# Patient Record
Sex: Female | Born: 1960 | Race: Black or African American | Hispanic: No | Marital: Married | Smoking: Former smoker
Health system: Southern US, Community
[De-identification: ages and names within clinical notes are randomized; demographics above are authoritative.]

## PROBLEM LIST (undated history)

## (undated) DIAGNOSIS — F419 Anxiety disorder, unspecified: Secondary | ICD-10-CM

## (undated) DIAGNOSIS — T883XXA Malignant hyperthermia due to anesthesia, initial encounter: Secondary | ICD-10-CM

## (undated) DIAGNOSIS — Z9889 Other specified postprocedural states: Secondary | ICD-10-CM

## (undated) DIAGNOSIS — G8929 Other chronic pain: Secondary | ICD-10-CM

## (undated) DIAGNOSIS — K5090 Crohn's disease, unspecified, without complications: Secondary | ICD-10-CM

## (undated) DIAGNOSIS — R109 Unspecified abdominal pain: Secondary | ICD-10-CM

## (undated) DIAGNOSIS — I509 Heart failure, unspecified: Secondary | ICD-10-CM

## (undated) DIAGNOSIS — G43909 Migraine, unspecified, not intractable, without status migrainosus: Secondary | ICD-10-CM

## (undated) DIAGNOSIS — Z9109 Other allergy status, other than to drugs and biological substances: Secondary | ICD-10-CM

## (undated) DIAGNOSIS — T8859XA Other complications of anesthesia, initial encounter: Secondary | ICD-10-CM

## (undated) DIAGNOSIS — R569 Unspecified convulsions: Secondary | ICD-10-CM

## (undated) DIAGNOSIS — F4310 Post-traumatic stress disorder, unspecified: Secondary | ICD-10-CM

## (undated) DIAGNOSIS — F429 Obsessive-compulsive disorder, unspecified: Secondary | ICD-10-CM

## (undated) DIAGNOSIS — T4145XA Adverse effect of unspecified anesthetic, initial encounter: Secondary | ICD-10-CM

## (undated) DIAGNOSIS — R51 Headache: Secondary | ICD-10-CM

## (undated) DIAGNOSIS — E119 Type 2 diabetes mellitus without complications: Secondary | ICD-10-CM

## (undated) DIAGNOSIS — Z9289 Personal history of other medical treatment: Secondary | ICD-10-CM

## (undated) DIAGNOSIS — Z8489 Family history of other specified conditions: Secondary | ICD-10-CM

## (undated) DIAGNOSIS — J45909 Unspecified asthma, uncomplicated: Secondary | ICD-10-CM

## (undated) DIAGNOSIS — R519 Headache, unspecified: Secondary | ICD-10-CM

## (undated) DIAGNOSIS — K589 Irritable bowel syndrome without diarrhea: Secondary | ICD-10-CM

## (undated) HISTORY — DX: Obsessive-compulsive disorder, unspecified: F42.9

## (undated) HISTORY — DX: Irritable bowel syndrome without diarrhea: K58.9

## (undated) HISTORY — DX: Heart failure, unspecified: I50.9

## (undated) HISTORY — PX: TUBAL LIGATION: SHX77

## (undated) HISTORY — PX: PARTIAL HYSTERECTOMY: SHX80

## (undated) HISTORY — DX: Post-traumatic stress disorder, unspecified: F43.10

## (undated) HISTORY — PX: BLADDER SURGERY: SHX569

## (undated) HISTORY — PX: ESOPHAGOGASTRODUODENOSCOPY: SHX1529

## (undated) HISTORY — DX: Crohn's disease, unspecified, without complications: K50.90

---

## 1998-03-21 ENCOUNTER — Emergency Department (HOSPITAL_COMMUNITY): Admission: EM | Admit: 1998-03-21 | Discharge: 1998-03-21 | Payer: Self-pay | Admitting: Emergency Medicine

## 1998-06-09 ENCOUNTER — Emergency Department (HOSPITAL_COMMUNITY): Admission: EM | Admit: 1998-06-09 | Discharge: 1998-06-09 | Payer: Self-pay | Admitting: Emergency Medicine

## 1999-01-18 ENCOUNTER — Emergency Department (HOSPITAL_COMMUNITY): Admission: EM | Admit: 1999-01-18 | Discharge: 1999-01-18 | Payer: Self-pay | Admitting: Emergency Medicine

## 1999-01-18 ENCOUNTER — Encounter: Payer: Self-pay | Admitting: Emergency Medicine

## 1999-07-27 ENCOUNTER — Emergency Department (HOSPITAL_COMMUNITY): Admission: EM | Admit: 1999-07-27 | Discharge: 1999-07-27 | Payer: Self-pay | Admitting: Emergency Medicine

## 1999-11-06 ENCOUNTER — Emergency Department (HOSPITAL_COMMUNITY): Admission: EM | Admit: 1999-11-06 | Discharge: 1999-11-06 | Payer: Self-pay | Admitting: Emergency Medicine

## 2000-02-11 ENCOUNTER — Emergency Department (HOSPITAL_COMMUNITY): Admission: EM | Admit: 2000-02-11 | Discharge: 2000-02-12 | Payer: Self-pay | Admitting: Emergency Medicine

## 2000-02-12 ENCOUNTER — Encounter: Payer: Self-pay | Admitting: Emergency Medicine

## 2000-07-26 ENCOUNTER — Emergency Department (HOSPITAL_COMMUNITY): Admission: EM | Admit: 2000-07-26 | Discharge: 2000-07-26 | Payer: Self-pay

## 2000-08-18 ENCOUNTER — Emergency Department (HOSPITAL_COMMUNITY): Admission: EM | Admit: 2000-08-18 | Discharge: 2000-08-18 | Payer: Self-pay | Admitting: Emergency Medicine

## 2001-02-20 ENCOUNTER — Emergency Department (HOSPITAL_COMMUNITY): Admission: EM | Admit: 2001-02-20 | Discharge: 2001-02-20 | Payer: Self-pay | Admitting: Emergency Medicine

## 2001-07-12 ENCOUNTER — Emergency Department (HOSPITAL_COMMUNITY): Admission: EM | Admit: 2001-07-12 | Discharge: 2001-07-12 | Payer: Self-pay | Admitting: Emergency Medicine

## 2002-09-17 ENCOUNTER — Encounter: Payer: Self-pay | Admitting: Emergency Medicine

## 2002-09-17 ENCOUNTER — Emergency Department (HOSPITAL_COMMUNITY): Admission: EM | Admit: 2002-09-17 | Discharge: 2002-09-17 | Payer: Self-pay | Admitting: Emergency Medicine

## 2002-09-18 ENCOUNTER — Encounter: Payer: Self-pay | Admitting: Emergency Medicine

## 2002-09-18 ENCOUNTER — Ambulatory Visit (HOSPITAL_COMMUNITY): Admission: RE | Admit: 2002-09-18 | Discharge: 2002-09-18 | Payer: Self-pay | Admitting: Emergency Medicine

## 2002-11-28 ENCOUNTER — Emergency Department (HOSPITAL_COMMUNITY): Admission: EM | Admit: 2002-11-28 | Discharge: 2002-11-28 | Payer: Self-pay | Admitting: *Deleted

## 2002-11-28 ENCOUNTER — Encounter: Payer: Self-pay | Admitting: *Deleted

## 2003-01-31 ENCOUNTER — Emergency Department (HOSPITAL_COMMUNITY): Admission: EM | Admit: 2003-01-31 | Discharge: 2003-02-01 | Payer: Self-pay | Admitting: *Deleted

## 2003-01-31 ENCOUNTER — Encounter: Payer: Self-pay | Admitting: *Deleted

## 2003-05-05 ENCOUNTER — Emergency Department (HOSPITAL_COMMUNITY): Admission: EM | Admit: 2003-05-05 | Discharge: 2003-05-05 | Payer: Self-pay | Admitting: Emergency Medicine

## 2003-06-03 ENCOUNTER — Emergency Department (HOSPITAL_COMMUNITY): Admission: EM | Admit: 2003-06-03 | Discharge: 2003-06-04 | Payer: Self-pay | Admitting: *Deleted

## 2003-10-25 ENCOUNTER — Emergency Department (HOSPITAL_COMMUNITY): Admission: EM | Admit: 2003-10-25 | Discharge: 2003-10-25 | Payer: Self-pay | Admitting: Emergency Medicine

## 2003-10-28 ENCOUNTER — Emergency Department (HOSPITAL_COMMUNITY): Admission: EM | Admit: 2003-10-28 | Discharge: 2003-10-28 | Payer: Self-pay | Admitting: *Deleted

## 2004-03-19 ENCOUNTER — Emergency Department (HOSPITAL_COMMUNITY): Admission: EM | Admit: 2004-03-19 | Discharge: 2004-03-19 | Payer: Self-pay | Admitting: Emergency Medicine

## 2004-04-04 ENCOUNTER — Emergency Department (HOSPITAL_COMMUNITY): Admission: EM | Admit: 2004-04-04 | Discharge: 2004-04-04 | Payer: Self-pay | Admitting: Emergency Medicine

## 2004-07-27 ENCOUNTER — Emergency Department (HOSPITAL_COMMUNITY): Admission: EM | Admit: 2004-07-27 | Discharge: 2004-07-27 | Payer: Self-pay | Admitting: Emergency Medicine

## 2004-09-04 ENCOUNTER — Other Ambulatory Visit: Admission: RE | Admit: 2004-09-04 | Discharge: 2004-09-04 | Payer: Self-pay | Admitting: Obstetrics & Gynecology

## 2004-12-08 ENCOUNTER — Emergency Department (HOSPITAL_COMMUNITY): Admission: EM | Admit: 2004-12-08 | Discharge: 2004-12-08 | Payer: Self-pay | Admitting: Emergency Medicine

## 2005-06-09 ENCOUNTER — Emergency Department (HOSPITAL_COMMUNITY): Admission: EM | Admit: 2005-06-09 | Discharge: 2005-06-09 | Payer: Self-pay | Admitting: Emergency Medicine

## 2005-11-20 ENCOUNTER — Emergency Department (HOSPITAL_COMMUNITY): Admission: EM | Admit: 2005-11-20 | Discharge: 2005-11-20 | Payer: Self-pay | Admitting: Emergency Medicine

## 2006-04-17 ENCOUNTER — Emergency Department (HOSPITAL_COMMUNITY): Admission: EM | Admit: 2006-04-17 | Discharge: 2006-04-17 | Payer: Self-pay | Admitting: Emergency Medicine

## 2007-04-07 ENCOUNTER — Encounter (INDEPENDENT_AMBULATORY_CARE_PROVIDER_SITE_OTHER): Payer: Self-pay | Admitting: Obstetrics and Gynecology

## 2007-04-08 ENCOUNTER — Inpatient Hospital Stay (HOSPITAL_COMMUNITY): Admission: RE | Admit: 2007-04-08 | Discharge: 2007-04-09 | Payer: Self-pay | Admitting: Obstetrics and Gynecology

## 2007-10-03 ENCOUNTER — Emergency Department (HOSPITAL_COMMUNITY): Admission: EM | Admit: 2007-10-03 | Discharge: 2007-10-04 | Payer: Self-pay | Admitting: Emergency Medicine

## 2009-07-31 ENCOUNTER — Emergency Department (HOSPITAL_COMMUNITY): Admission: EM | Admit: 2009-07-31 | Discharge: 2009-07-31 | Payer: Self-pay | Admitting: Internal Medicine

## 2009-12-05 ENCOUNTER — Emergency Department (HOSPITAL_COMMUNITY): Admission: EM | Admit: 2009-12-05 | Discharge: 2009-12-05 | Payer: Self-pay | Admitting: Emergency Medicine

## 2010-04-25 ENCOUNTER — Emergency Department (HOSPITAL_COMMUNITY): Admission: EM | Admit: 2010-04-25 | Discharge: 2010-04-25 | Payer: Self-pay | Admitting: Emergency Medicine

## 2010-04-27 ENCOUNTER — Emergency Department (HOSPITAL_COMMUNITY): Admission: EM | Admit: 2010-04-27 | Discharge: 2010-04-28 | Payer: Self-pay | Admitting: Emergency Medicine

## 2010-05-14 ENCOUNTER — Encounter: Admission: RE | Admit: 2010-05-14 | Discharge: 2010-05-14 | Payer: Self-pay | Admitting: Family Medicine

## 2010-07-09 ENCOUNTER — Encounter: Admission: RE | Admit: 2010-07-09 | Discharge: 2010-07-09 | Payer: Self-pay | Admitting: Family Medicine

## 2010-09-19 ENCOUNTER — Emergency Department (HOSPITAL_COMMUNITY)
Admission: EM | Admit: 2010-09-19 | Discharge: 2010-09-19 | Payer: Self-pay | Source: Home / Self Care | Admitting: Emergency Medicine

## 2010-12-28 LAB — URINALYSIS, ROUTINE W REFLEX MICROSCOPIC
Bilirubin Urine: NEGATIVE
Nitrite: NEGATIVE
Specific Gravity, Urine: >1.03 — ABNORMAL HIGH (ref 1.005–1.030)
Urobilinogen, UA: 0.2 mg/dL (ref 0.0–1.0)
pH: 5.5 (ref 5.0–8.0)

## 2010-12-28 LAB — DIFFERENTIAL
Basophils Absolute: 0 10*3/uL (ref 0.0–0.1)
Basophils Relative: 0 % (ref 0–1)
Lymphocytes Relative: 5 % — ABNORMAL LOW (ref 12–46)
Neutro Abs: 10.7 10*3/uL — ABNORMAL HIGH (ref 1.7–7.7)
Neutrophils Relative %: 91 % — ABNORMAL HIGH (ref 43–77)

## 2010-12-28 LAB — BASIC METABOLIC PANEL
BUN: 13 mg/dL (ref 6–23)
Calcium: 9.1 mg/dL (ref 8.4–10.5)
GFR calc non Af Amer: >60 mL/min (ref >60)
Glucose, Bld: 169 mg/dL — ABNORMAL HIGH (ref 70–99)
Potassium: 4 mEq/L (ref 3.5–5.1)
Sodium: 138 mEq/L (ref 135–145)

## 2010-12-28 LAB — CBC
HCT: 43.3 % (ref 36.0–46.0)
Hemoglobin: 15.1 g/dL — ABNORMAL HIGH (ref 12.0–15.0)
MCHC: 34.9 g/dL (ref 30.0–36.0)
RDW: 13.2 % (ref 11.5–15.5)
WBC: 11.8 10*3/uL — ABNORMAL HIGH (ref 4.0–10.5)

## 2011-01-03 LAB — COMPREHENSIVE METABOLIC PANEL
AST: 20 U/L (ref 0–37)
Albumin: 4 g/dL (ref 3.5–5.2)
Alkaline Phosphatase: 85 U/L (ref 39–117)
BUN: 14 mg/dL (ref 6–23)
GFR calc Af Amer: >60 mL/min (ref >60)
Potassium: 5 mEq/L (ref 3.5–5.1)
Sodium: 140 mEq/L (ref 135–145)
Total Protein: 7.6 g/dL (ref 6.0–8.3)

## 2011-01-03 LAB — DIFFERENTIAL
Basophils Relative: 0 % (ref 0–1)
Eosinophils Relative: 1 % (ref 0–5)
Monocytes Absolute: 0.4 10*3/uL (ref 0.1–1.0)
Monocytes Relative: 5 % (ref 3–12)
Neutro Abs: 6.1 10*3/uL (ref 1.7–7.7)

## 2011-01-03 LAB — URINALYSIS, ROUTINE W REFLEX MICROSCOPIC
Nitrite: NEGATIVE
Protein, ur: NEGATIVE mg/dL
Specific Gravity, Urine: >1.03 — ABNORMAL HIGH (ref 1.005–1.030)
Urobilinogen, UA: 0.2 mg/dL (ref 0.0–1.0)

## 2011-01-03 LAB — CBC
Platelets: 227 10*3/uL (ref 150–400)
RBC: 4.68 MIL/uL (ref 3.87–5.11)
RDW: 13.1 % (ref 11.5–15.5)
WBC: 7.8 10*3/uL (ref 4.0–10.5)

## 2011-01-12 ENCOUNTER — Emergency Department (HOSPITAL_COMMUNITY)
Admission: EM | Admit: 2011-01-12 | Discharge: 2011-01-13 | Disposition: A | Discharge status: Home / Self Care | Payer: BC Managed Care – PPO | Attending: Emergency Medicine | Admitting: Emergency Medicine

## 2011-01-12 ENCOUNTER — Emergency Department (HOSPITAL_COMMUNITY): Payer: BC Managed Care – PPO

## 2011-01-12 DIAGNOSIS — M25569 Pain in unspecified knee: Secondary | ICD-10-CM | POA: Insufficient documentation

## 2011-01-21 LAB — DIFFERENTIAL
Basophils Absolute: 0 K/uL (ref 0.0–0.1)
Basophils Relative: 0 % (ref 0–1)
Eosinophils Absolute: 0.2 K/uL (ref 0.0–0.7)
Eosinophils Relative: 3 % (ref 0–5)
Lymphocytes Relative: 35 % (ref 12–46)
Lymphs Abs: 2.1 K/uL (ref 0.7–4.0)
Monocytes Absolute: 0.3 K/uL (ref 0.1–1.0)
Monocytes Relative: 6 % (ref 3–12)
Neutro Abs: 3.4 K/uL (ref 1.7–7.7)
Neutrophils Relative %: 56 % (ref 43–77)

## 2011-01-21 LAB — URINALYSIS, ROUTINE W REFLEX MICROSCOPIC
Glucose, UA: NEGATIVE mg/dL
Hgb urine dipstick: NEGATIVE
Protein, ur: NEGATIVE mg/dL
Specific Gravity, Urine: 1.02 (ref 1.005–1.030)

## 2011-01-21 LAB — BASIC METABOLIC PANEL
CO2: 27 mEq/L (ref 19–32)
Chloride: 103 mEq/L (ref 96–112)
GFR calc Af Amer: >60 mL/min (ref >60)
Sodium: 137 mEq/L (ref 135–145)

## 2011-01-21 LAB — CBC
Platelets: 212 10*3/uL (ref 150–400)
RDW: 13.1 % (ref 11.5–15.5)
WBC: 6.1 10*3/uL (ref 4.0–10.5)

## 2011-03-02 NOTE — Op Note (Signed)
Kathryn Olson, Kathryn Olson                ACCOUNT NO.:  [id number]   MEDICAL RECORD NO.:  [id number]          PATIENT TYPE:  OIB   LOCATION:  9399                          FACILITY:  [facility]   PHYSICIAN:  Darlyn Chamber, M.D.   DATE OF BIRTH:  [birth year]   DATE OF PROCEDURE:  04/07/2007  DATE OF DISCHARGE:                               OPERATIVE REPORT   PREOPERATIVE DIAGNOSIS:  Pelvic pain.   POSTOPERATIVE DIAGNOSIS:  Pelvic pain, with evidence of adhesions and  recurrent fibroid.   OPERATIVE PROCEDURE:  Open laparoscopy, lysis of adhesion, laparoscopic-  assisted vaginal hysterectomy.   SURGEON:  McComb.   ASSISTANT:  Grewal.   ANESTHESIA:  Was general.   ESTIMATED BLOOD LOSS:  Was 300-400 mL.   PACKS AND DRAINS:  None.   INTRAOPERATIVE BLOOD REPLACED:  None.   COMPLICATIONS:  None.   INDICATIONS:  Dictated in the history and physical.   PROCEDURE WENT AS FOLLOWS:  The patient was taken to the OR and placed  in supine position.  After satisfactory level of general endotracheal  anesthesia obtained, the patient was placed in a dorsal spine position  using the Allen stirrups.  The abdomen, perineum and vagina were prepped  out with Betadine.  Bladder was emptied with catheterization.  Hulka  tenaculum was put in place and secured.  Patient then draped in a  sterile field.  Subumbilical incision was made knife and extended  through subcutaneous tissue.  Fascia was entered sharply and incision in  the fascia extended laterally.  Rectus muscles were separated in the  midline.  The peritoneum was identified and entered sharply.  An open  laparoscopic trocar was put in place and secured.  Laparoscope was  introduced and abdomen deflated with carbon dioxide.  There were omental  adhesions to the left lower quadrant.  These were taken down using the  gyrus.  No bowel was involved.  The uterus was enlarged with anterior  wall fibroids.  There were adhesions anteriorly to the bladder  area.  Tubes and ovaries were unremarkable.  Cul-de-sac was clear.  Appendix  was visualized and noted be normal.  Upper abdomen including liver and  tip of the gallbladder were clear.  A 5-mm trocar was put in place in  the suprapubic area under direct visualization.  Using the gyrus, the  right utero-ovarian pedicle was cauterized incised; the right round  ligament was cauterized incised down to the broad ligament.  We then  went to the left side.  The left utero-ovarian pedicle was cauterized  incised.  The left round ligament was cauterized incised.  We then  attacked the scar in the anterior, using the gyrus, we were able to  cauterized incised and free the uterus up anteriorly.  There was no  signs of entry into the bladder.  At this point in time, the decision  was to go vaginally.   The laparoscope was removed.  The abdomen was deflated of its carbon  dioxide.  Hulka tenaculum was then removed.  Weighted speculum was  placed in vaginal vault.  Cervix was grasped  with the [facility] tenaculum.  Cul-de-sac was entered sharply.  Both uterosacral ligaments were  clamped, cut and suture ligated with 0 Vicryl.  Reflection of the  vaginal mucosa anteriorly was incised using the Bovie, and bladder was  dissected superiorly.  Paracervical tissue was then clamped, cut and  suture ligated with 0 Vicryl.  Vesicouterine space was identified, and a  retractor was put in place.  Using the clamp, cut and tie technique with  suture ligature of 0 Vicryl, the parametria was serially separated from  the sides of the uterus.  Uterus was then removed.  There was small  bleeding from the right side of the cuff brought under control with 2  figure-of-eights of 0 Vicryl.  At this point in time, vaginal mucosa was  closed in a vertical fashion with interrupted figure-of-eights of 0  Monocryl.  Foley was placed to straight drain.  Clear urine output was  obtained.   The patient's legs were repositioned.   Laparoscope was reintroduced.  The abdomen was deflated of carbon dioxide.  We used the gyrus to  irrigate the pelvis.  Areas of bleeding at the vaginal cuff brought  under control using cautery from the gyrus.  Eventually, we had good  hemostasis.  Abdomen was deflated of carbon dioxide.  All trocars  removed.  Subumbilical fascia closed with figure-of-eight of 0 Vicryl,  skin with interrupted subcuticulars of 4-0 Vicryl.  Suprapubic incision  closed with Dermabond.  The patient was taken out of the dorsal  lithotomy position.  Once alert and extubated, transferred to the  recovery room in good condition.  Sponge, instrument and needle count  reported as correct by circulating nurse x2.  Foley catheter remained  clear at time of closure.  The patient tolerated the procedure well and  was returned to the recovery room in good condition.      Darlyn Chamber, M.D.  Electronically Signed     JSM/MEDQ  D:  04/07/2007  T:  04/07/2007  Job:  [id number]

## 2011-03-02 NOTE — H&P (Signed)
Kathryn Olson, PREISS                ACCOUNT NO.:  [id number]   MEDICAL RECORD NO.:  [id number]          PATIENT TYPE:  AMB   LOCATION:  SDC                           FACILITY:  [facility]   PHYSICIAN:  Darlyn Chamber, M.D.   DATE OF BIRTH:  06-13-1961   DATE OF ADMISSION:  04/07/2007  DATE OF DISCHARGE:                              HISTORY & PHYSICAL   The patient is a 50 year old gravida 2, para 2 female who presents for  laparoscopic-assisted vaginal hysterectomy.   In relation to the present admission, the patient has had a previous  myomectomy done in 2003.  She developed increasing pelvic pain and  discomfort and pain with intercourse.  Evaluation in the office has been  relatively unremarkable.  Uterus felt to be normal size and shape.  We  did do an ultrasound evaluation.  There was a small fibroid remaining  but otherwise ultrasound was unremarkable.  We had discussed proceeding  with laparoscopic evaluation for pelvic adhesions for which we were  initially scheduled for; however, the patient has declined this and  wishes to proceed with more aggressive definitive therapy in the form of  laparoscopic-assisted vaginal hysterectomy for which she is admitted at  the present time.  She does desire ovaries to remain in place.  We had  discussed with her the potential risk of ovarian cancer and difficulty  for detection.  She still wants her ovaries left.   IN TERMS OF ALLERGIES, SHE IS ALLERGIC TO SULFA, ASPIRIN, PENICILLIN,  CODEINE, PEANUTS, RUBBING ALCOHOL.   MEDICATIONS:  Are none.   PAST MEDICAL HISTORY:  Does have a history of migraine headaches.  Otherwise usual childhood diseases.   SURGICAL HISTORY:  She had a previous myomectomy and two cesarean  sections, last one with bilateral tubal ligation.   SOCIAL HISTORY:  Does reveal one pack per week tobacco use.  Occasional  alcohol use.   FAMILY HISTORY:  Mother with history of diabetes and high blood  pressure.  Father with  a history of heart attack.   REVIEW OF SYSTEMS:  Is noncontributory.   PHYSICAL EXAMINATION:  The patient is afebrile with stable vital signs.  HEENT EXAM:  The patient is normocephalic.  Pupils equal, round and  reactive to light accommodation.  Extraocular movements were intact.  Sclerae and conjunctivae clear.  Oropharynx clear.  NECK:  Without thyromegaly.  BREASTS:  No discrete masses.  LUNGS:  Clear.  CARDIOVASCULAR SYSTEM:  Regular rhythm and rate without murmurs or  gallops.  ABDOMINAL EXAM:  Is benign.  No mass, organomegaly or tenderness.  Well-  healed low transverse incision.  PELVIC:  Normal external genitalia.  Vaginal is clear.  Cervix  unremarkable.  Uterus normal size, shape and contour.  Adnexa free of  masses or tenderness.  EXTREMITIES:  Trace edema.  NEUROLOGICAL EXAM:  Grossly normal limits.   IMPRESSION:  Pelvic pain secondary to probable pelvic adhesions.   PLAN:  Options have been discussed for management including a  laparoscopic evaluation versus hysterectomy.  The patient is admitted  for the latter at the present time.  Risks of surgery have been  discussed including the risk of infection.  The risk of hemorrhage that  could require transfusion with the risk of AIDS or hepatitis, risk of  injury to adjacent organs including bladder, bowel or ureters that could  require further exploratory surgery.  The risk of deep venous thrombosis  and pulmonary emboli.  The patient does understand should pelvic  findings indicate we may have to proceed with an abdominal incision to  complete the procedure.  Again, ovaries will be left in place per the  patient's request.      Darlyn Chamber, M.D.  Electronically Signed     JSM/MEDQ  D:  04/07/2007  T:  04/07/2007  Job:  [id number]

## 2011-03-02 NOTE — Discharge Summary (Signed)
NAMEJAVEN, Kathryn Olson                ACCOUNT NO.:  [id number]   MEDICAL RECORD NO.:  [id number]          PATIENT TYPE:  OIB   LOCATION:  9319                          FACILITY:  [facility]   PHYSICIAN:  Darlyn Chamber, M.D.   DATE OF BIRTH:  06/25/1961   DATE OF ADMISSION:  04/07/2007  DATE OF DISCHARGE:  04/08/2007                               DISCHARGE SUMMARY   ADMISSION DIAGNOSIS:  Pelvic pain with recurrent uterine fibroids.   DISCHARGE DIAGNOSIS:  Pelvic pain with recurrent uterine fibroids,  pelvic adhesions.   PROCEDURE:  Laparoscopic-assisted vaginal hysterectomy with lysis of  adhesions.   For complete history and physical, please see dictated note.   COURSE IN THE HOSPITAL:  The patient underwent above-noted surgery.  Did  have dense adhesions between the anterior part of the uterus in the  bladder area.  Also had some omental adhesions, all of which were taken  down.  Ovaries appeared to be normal.  Surgery went well.  Pathology is  pending.  Postoperatively, the patient did well first postoperative day,  afebrile with stable vital signs.  Abdomen soft.  Bowel sounds were  active.  She was tolerating a diet.  Her abdominal exam was benign.  Bowel sounds were active.  Incision was clear.  No active vaginal  bleeding and was voiding.  Postoperative hemoglobin was 13.  In terms of  complication, none were encountered during stay in the hospital.  The  patient was discharged home in stable condition.   DISPOSITION:  Routine postoperative instructions and orders given.  She  is to avoid heavy lifting, vaginal entrance or driving a car.  She is to  watch for signs of infection, nausea/vomiting, increasing abdominal pain  or pelvic pain, or active vaginal bleeding.  We also instructed her to  watch for signs of deep venous thrombosis or pulmonary embolus.  Discharged home on hydrocodone as needed for pain.   PLAN:  Follow-up in the office in 1 week.      Darlyn Chamber,  M.D.  Electronically Signed     JSM/MEDQ  D:  04/08/2007  T:  04/08/2007  Job:  [id number]

## 2011-03-25 ENCOUNTER — Emergency Department (HOSPITAL_COMMUNITY)
Admission: EM | Admit: 2011-03-25 | Discharge: 2011-03-25 | Disposition: A | Discharge status: Home / Self Care | Payer: BC Managed Care – PPO | Attending: Emergency Medicine | Admitting: Emergency Medicine

## 2011-03-25 DIAGNOSIS — Y998 Other external cause status: Secondary | ICD-10-CM | POA: Insufficient documentation

## 2011-03-25 DIAGNOSIS — IMO0002 Reserved for concepts with insufficient information to code with codable children: Secondary | ICD-10-CM | POA: Insufficient documentation

## 2011-03-28 ENCOUNTER — Inpatient Hospital Stay (HOSPITAL_COMMUNITY)
Admission: EM | Admit: 2011-03-28 | Discharge: 2011-03-31 | DRG: 278 | Disposition: A | Discharge status: Home / Self Care | Payer: BC Managed Care – PPO | Source: Ambulatory Visit | Attending: Internal Medicine | Admitting: Internal Medicine

## 2011-03-28 DIAGNOSIS — F410 Panic disorder [episodic paroxysmal anxiety] without agoraphobia: Secondary | ICD-10-CM | POA: Diagnosis present

## 2011-03-28 DIAGNOSIS — K3189 Other diseases of stomach and duodenum: Secondary | ICD-10-CM | POA: Diagnosis present

## 2011-03-28 DIAGNOSIS — L02619 Cutaneous abscess of unspecified foot: Principal | ICD-10-CM | POA: Diagnosis present

## 2011-03-28 DIAGNOSIS — L03119 Cellulitis of unspecified part of limb: Principal | ICD-10-CM | POA: Diagnosis present

## 2011-03-28 DIAGNOSIS — F411 Generalized anxiety disorder: Secondary | ICD-10-CM | POA: Diagnosis present

## 2011-03-29 ENCOUNTER — Inpatient Hospital Stay (HOSPITAL_COMMUNITY): Payer: BC Managed Care – PPO

## 2011-03-29 LAB — CBC
HCT: 41 % (ref 36.0–46.0)
Hemoglobin: 13.8 g/dL (ref 12.0–15.0)
MCH: 30.1 pg (ref 26.0–34.0)
MCHC: 33.7 g/dL (ref 30.0–36.0)
MCV: 89.3 fL (ref 78.0–100.0)
Platelets: 252 10*3/uL (ref 150–400)
RBC: 4.59 MIL/uL (ref 3.87–5.11)
RDW: 13.6 % (ref 11.5–15.5)
WBC: 7.1 10*3/uL (ref 4.0–10.5)

## 2011-03-29 LAB — TSH: TSH: 1.149 u[IU]/mL (ref 0.350–4.500)

## 2011-03-29 LAB — DIFFERENTIAL
Basophils Absolute: 0 K/uL (ref 0.0–0.1)
Basophils Relative: 0 % (ref 0–1)
Eosinophils Absolute: 0.2 10*3/uL (ref 0.0–0.7)
Eosinophils Relative: 3 % (ref 0–5)
Lymphocytes Relative: 31 % (ref 12–46)
Lymphs Abs: 2.2 10*3/uL (ref 0.7–4.0)
Monocytes Absolute: 0.5 K/uL (ref 0.1–1.0)
Monocytes Relative: 7 % (ref 3–12)
Neutro Abs: 4.2 10*3/uL (ref 1.7–7.7)
Neutrophils Relative %: 59 % (ref 43–77)

## 2011-03-29 LAB — BASIC METABOLIC PANEL WITH GFR
BUN: 17 mg/dL (ref 6–23)
Calcium: 9.9 mg/dL (ref 8.4–10.5)
GFR calc Af Amer: >60 mL/min (ref >60)
GFR calc non Af Amer: >60 mL/min (ref >60)
Potassium: 3.8 meq/L (ref 3.5–5.1)

## 2011-03-29 LAB — BASIC METABOLIC PANEL
CO2: 27 mEq/L (ref 19–32)
Chloride: 106 mEq/L (ref 96–112)
Creatinine, Ser: 0.82 mg/dL (ref 0.4–1.2)
Glucose, Bld: 106 mg/dL — ABNORMAL HIGH (ref 70–99)
Sodium: 141 mEq/L (ref 135–145)

## 2011-03-29 LAB — MRSA PCR SCREENING: MRSA by PCR: NEGATIVE

## 2011-03-30 ENCOUNTER — Inpatient Hospital Stay (HOSPITAL_COMMUNITY): Payer: BC Managed Care – PPO

## 2011-03-30 LAB — BASIC METABOLIC PANEL
CO2: 27 mEq/L (ref 19–32)
Chloride: 103 mEq/L (ref 96–112)
Glucose, Bld: 119 mg/dL — ABNORMAL HIGH (ref 70–99)
Sodium: 136 mEq/L (ref 135–145)

## 2011-03-30 LAB — URINALYSIS, ROUTINE W REFLEX MICROSCOPIC
Glucose, UA: NEGATIVE mg/dL
Hgb urine dipstick: NEGATIVE
Specific Gravity, Urine: 1.025 (ref 1.005–1.030)
Urobilinogen, UA: 0.2 mg/dL (ref 0.0–1.0)
pH: 5.5 (ref 5.0–8.0)

## 2011-03-30 LAB — HEPATIC FUNCTION PANEL
ALT: 11 U/L (ref 0–35)
AST: 13 U/L (ref 0–37)
Albumin: 3.1 g/dL — ABNORMAL LOW (ref 3.5–5.2)
Alkaline Phosphatase: 76 U/L (ref 39–117)
Total Protein: 6 g/dL (ref 6.0–8.3)

## 2011-03-30 LAB — CBC
Hemoglobin: 12.6 g/dL (ref 12.0–15.0)
Platelets: 228 10*3/uL (ref 150–400)
RBC: 4.2 MIL/uL (ref 3.87–5.11)
WBC: 6.3 10*3/uL (ref 4.0–10.5)

## 2011-03-30 LAB — PREGNANCY, URINE: Preg Test, Ur: NEGATIVE

## 2011-03-30 NOTE — H&P (Signed)
Kathryn Olson, Kathryn Olson                ACCOUNT NO.:  [id number]  MEDICAL RECORD NO.:  [id number]  LOCATION:  APED                          FACILITY:  [facility]  PHYSICIAN:  Bonnielee Haff, MD     DATE OF BIRTH:  07/11/1961  DATE OF ADMISSION:  03/29/2011 DATE OF DISCHARGE:  LH                             HISTORY & PHYSICAL   PRIMARY CARE PHYSICIAN:  Tanya D. Hassell Done, MD, [facility] in [place].  ADMISSION DIAGNOSES: 1. Right foot cellulitis. 2. Spider bite. 3. Anxiety disorder and history of panic attacks.  CHIEF COMPLAINT:  Pain in the right foot.  HISTORY OF PRESENT ILLNESS:  The patient is a 50 year old African American female who thinks that she was bit by a spider on Thursday night while she was sleeping on the bed.  She presented to the ED at that time and was prescribed doxycycline orally as well as triamcinolone cream.  The patient took these medications, however, her symptoms kept getting worse through the course of the next few days.  Denies any fever, but has been having chills.  The pain is 10/10 in intensity.  The redness and swelling have been spreading from the bite mark towards the rest of the foot as well as of the leg.  She has been unable to ambulate even with the crutches that were provided to her.  The swelling has been increasing.  She has also developed some stomach cramps without any vomiting and complains of a headache as well.  Denies any numbness per se at this time.  She has a history of being constipated all the time.  MEDICATIONS AT HOME:  Xanax 0.5 mg 3 times a day, Zyrtec for allergies, and then she was prescribed doxycycline 100 mg b.i.d. and then triamcinolone cream on Thursday.  ALLERGIES: 1. ASPIRIN which causes shortness of breath. 2. CODEINE causes hives. 3. PENICILLIN causes shortness of breath. 4. She is allergic to SULFA DRUGS which causes hives.  PAST MEDICAL HISTORY:  Positive for anxiety disorder and  migraine headaches.  SURGICAL HISTORY:  C-section x2, removal of a benign thyroid tumor.  She has had a partial hysterectomy.  SOCIAL HISTORY:  She lives in the [place] with her husband and family.  She does not work, does not smoke.  Occasional alcohol use.  No illicit drug use.  FAMILY HISTORY:  Positive for father who died of heart failure.  Mother has diabetes.  Grandmother had heart failure.  REVIEW OF SYSTEMS:  GENERAL:  Positive for weakness and malaise.  HEENT: Positive for headache.  CARDIOVASCULAR:  Unremarkable.  RESPIRATORY: Unremarkable.  GI:  Positive for stomach cramps and some nausea, but no emesis.  GU:  Unremarkable.  NEUROLOGICAL:  Unremarkable.  PSYCHIATRIC: Unremarkable.  DERMATOLOGICAL:  Positive for redness over her right foot.  Other systems are reviewed and found to be negative.  PHYSICAL EXAMINATION:  VITAL SIGNS:  Temperature 98.2, blood pressure 152/80, heart rate 68, respiratory rate 17, and saturation 98% on room air. GENERAL:  This is a overweight African American female in no distress. HEENT:  Head is normocephalic and atraumatic.  Pupils are equal, reacting.  No pallor, no icterus.  Oral mucous membranes  are moist.  No oral lesions are noted. NECK:  Soft and supple.  No thyromegaly appreciated. LUNGS:  Clear to auscultation bilaterally.  No wheezing, rales, or rhonchi.  No cervical, supraclavicular, or inguinal lymphadenopathy is present. CARDIOVASCULAR:  S1 and S2 is normal, regular.  No S3, S4, rubs, murmurs, or bruits.  No pedal edema.  No JVD. ABDOMEN:  Soft, nontender, and nondistended.  It is obese.  No masses or organomegaly is appreciated. GU:  Deferred. MUSCULOSKELETAL:  Normal muscle mass and tone.  Right foot was examined, it is erythematous, slightly warm to touch.  Peripheral pulses are bounding.  No focal neurological deficits are present.  She has got a bite mark in the medial aspect of her foot almost next to the  plantar surface.  That area bruised up.  There is no obvious induration at this time. NEUROLOGICAL:  She is alert and oriented x3.  No focal neurological deficits are present. SKIN:  Positive for erythema over the right foot as described above.  LABORATORY DATA:  Her white count is normal, hemoglobin is 13.8, and platelet count is normal.  Her electrolytes are normal.  Renal function is normal.  She has not had any imaging studies yet.  ASSESSMENT:  This is a 50 year old African American female with a past medical history as stated earlier who presents with worsening pain in her right foot.  She apparently had some kind of a bite on Thursday, possibly spider and this cellulitis from that has worsened over the last few days.  She has failed outpatient oral antibiotic therapy. 1. Right foot cellulitis.  Will be treated with IV vancomycin.  We     will get x-ray of the foot to make sure there is nothing else going     on.  Blood cultures will be done if the patient has fever. 2. Stomach cramping.  Could be related to spider bite, although it is     unusual so many days later.  We will give her a dose of Zofran and     Ativan.  At this time, no further evaluation is necessary as     her abdomen was completely benign to examination. 3. History of anxiety disorder and panic attacks.  Continue with     Xanax. 4. Deep vein thrombosis prophylaxis will be provided.  Further management decisions will depend on results of further testing and patient's response to treatment.  Bonnielee Haff, MD     GK/MEDQ  D:  03/29/2011  T:  03/29/2011  Job:  [id number]  cc:   Tanya D. Hassell Done, M.D. Fax: [phone]  Electronically Signed by Bonnielee Haff MD on 03/30/2011 12:05:06 AM

## 2011-03-31 LAB — VANCOMYCIN, RANDOM: Vancomycin Rm: 13.6 ug/mL

## 2011-03-31 LAB — URINE CULTURE: Culture  Setup Time: 201206121905

## 2011-04-04 NOTE — Discharge Summary (Signed)
Kathryn Olson, Kathryn Olson                ACCOUNT NO.:  [id number]  MEDICAL RECORD NO.:  [id number]  LOCATION:  A209                          FACILITY:  [facility]  PHYSICIAN:  Rexene Alberts, M.D.    DATE OF BIRTH:  1961/07/16  DATE OF ADMISSION:  03/29/2011 DATE OF DISCHARGE:  06/13/2012LH                              DISCHARGE SUMMARY   DISCHARGE DIAGNOSES: 1. Right foot cellulitis, status post insect bite. 2. Generalized anxiety disorder with panic disorder.  Remained stable. 3. Dyspepsia.  DISCHARGE MEDICATIONS: 1. Pepcid 20 mg daily. 2. Doxycycline 100 mg b.i.d. for four more days. 3. Xyzal 10 mg daily. 4. Multivitamin once daily. 5. Xanax 0.5 mg three times daily.  DISCHARGE DISPOSITION:  The patient was discharged to home in improved and stable condition on March 31, 2011.  She was advised to follow up with her primary care physician, Dr. Hassell Done in 1-2 weeks.  CONSULTATIONS:  None.  PROCEDURES PERFORMED: 1. Ultrasound of the abdomen on March 30, 2011.  The results revealed     no acute abnormalities. 2. X-ray of the right foot on March 29, 2011.  The results revealed     medial forefoot soft tissue swelling.  No acute osseous     abnormality.  HISTORY OF PRESENTING ILLNESS:  The patient is a 50 year old woman with a past medical history significant for anxiety disorder, who presented to the emergency department on March 28, 2011, with a chief complaint of right foot pain.  According to the history, she believed that she was bitten by a spider.  She had no associated fever, but she did complain of subjective chills.  The pain in her foot and her leg became progressively worse.  Redness ensued and worsened.  She was actually seen initially on March 25, 2011.  During the evaluation in the emergency department, she was treated supportively and discharged on doxycycline and triamcinolone cream.  When her symptoms did not improve, she came back to the ED on March 28, 2011.  HOSPITAL  COURSE:  During the initial evaluation in the emergency department, the patient was noted to be afebrile and hemodynamically stable.  She was given a 600 mg dose of clindamycin.  Following admission, vancomycin was started empirically for treatment of right foot cellulitis.  She was advised to keep her right leg elevated.  Her pain was treated with as-needed morphine or as-needed oxycodone.  Over the course of the hospitalization, the erythema had completely resolved of her forefoot and proximal leg.  There was a small amount of erythema still present at the nidus of infection.  Edema and tenderness completely resolved.  She remained afebrile during the entire hospitalization.  Her white blood cell count remained within normal limits.  She received a total of four doses of antibiotics during the hospitalization including what she received in the emergency department. She was advised to continue doxycycline at 100 mg b.i.d. for four more days.  The patient complained of a nonspecific abdominal discomfort.  For further evaluation, an ultrasound of her abdomen was ordered along with additional laboratory studies.  The ultrasound of her abdomen was without any acute abnormalities.  Her urine pregnancy test was negative.  Her urinalysis was without infection.  Her pancreatic enzymes were within normal limits.  Her liver transaminases were within normal limits.  She was subsequently started on Pepcid.  She was discharged on Pepcid.  Also, her thyroid function was assessed. Her free T4 was within normal limits at 1.08 and her TSH was within normal limits at 1.14.     Rexene Alberts, M.D.     DF/MEDQ  D:  03/31/2011  T:  04/01/2011  Job:  [id number]  cc:   Tanya D. Hassell Done, M.D. Fax: [phone]  Electronically Signed by Rexene Alberts M.D. on 04/04/2011 02:11:14 PM

## 2011-07-23 LAB — URINE MICROSCOPIC-ADD ON

## 2011-07-23 LAB — DIFFERENTIAL
Basophils Absolute: 0
Lymphocytes Relative: 16
Neutro Abs: 9 — ABNORMAL HIGH

## 2011-07-23 LAB — CBC
HCT: 41.3
MCV: 87.5
Platelets: 258
WBC: 11.5 — ABNORMAL HIGH

## 2011-07-23 LAB — COMPREHENSIVE METABOLIC PANEL
Albumin: 3.6
BUN: 12
CO2: 27
Chloride: 107
Creatinine, Ser: 0.72
GFR calc non Af Amer: >60
Total Bilirubin: 0.4

## 2011-07-23 LAB — URINALYSIS, ROUTINE W REFLEX MICROSCOPIC
Nitrite: NEGATIVE
Specific Gravity, Urine: >1.03 — ABNORMAL HIGH
Urobilinogen, UA: 0.2

## 2011-07-23 LAB — LIPASE, BLOOD: Lipase: 23

## 2011-07-23 LAB — URINE CULTURE

## 2011-08-04 LAB — CBC
HCT: 39.2
HCT: 42.3
Hemoglobin: 13
MCHC: 33.1
MCHC: 33.3
MCV: 89.7
RBC: 4.72
RDW: 13.6
WBC: 5.8

## 2011-08-30 ENCOUNTER — Other Ambulatory Visit: Payer: Self-pay | Admitting: Family Medicine

## 2011-08-30 DIAGNOSIS — N63 Unspecified lump in unspecified breast: Secondary | ICD-10-CM

## 2011-09-14 ENCOUNTER — Ambulatory Visit
Admission: RE | Admit: 2011-09-14 | Discharge: 2011-09-14 | Disposition: A | Discharge status: Home / Self Care | Payer: BC Managed Care – PPO | Source: Ambulatory Visit | Attending: Family Medicine | Admitting: Family Medicine

## 2011-09-14 DIAGNOSIS — N63 Unspecified lump in unspecified breast: Secondary | ICD-10-CM

## 2011-11-17 ENCOUNTER — Emergency Department (HOSPITAL_COMMUNITY)
Admission: EM | Admit: 2011-11-17 | Discharge: 2011-11-18 | Disposition: A | Discharge status: Home / Self Care | Payer: BC Managed Care – PPO | Attending: Emergency Medicine | Admitting: Emergency Medicine

## 2011-11-17 ENCOUNTER — Encounter (HOSPITAL_COMMUNITY): Payer: Self-pay | Admitting: *Deleted

## 2011-11-17 ENCOUNTER — Other Ambulatory Visit: Payer: Self-pay

## 2011-11-17 ENCOUNTER — Emergency Department (HOSPITAL_COMMUNITY): Payer: BC Managed Care – PPO

## 2011-11-17 DIAGNOSIS — R079 Chest pain, unspecified: Secondary | ICD-10-CM | POA: Insufficient documentation

## 2011-11-17 DIAGNOSIS — R1013 Epigastric pain: Secondary | ICD-10-CM | POA: Insufficient documentation

## 2011-11-17 DIAGNOSIS — R10816 Epigastric abdominal tenderness: Secondary | ICD-10-CM | POA: Insufficient documentation

## 2011-11-17 DIAGNOSIS — Z9851 Tubal ligation status: Secondary | ICD-10-CM | POA: Insufficient documentation

## 2011-11-17 DIAGNOSIS — K279 Peptic ulcer, site unspecified, unspecified as acute or chronic, without hemorrhage or perforation: Secondary | ICD-10-CM | POA: Insufficient documentation

## 2011-11-17 DIAGNOSIS — Z9079 Acquired absence of other genital organ(s): Secondary | ICD-10-CM | POA: Insufficient documentation

## 2011-11-17 LAB — BASIC METABOLIC PANEL
Calcium: 9.8 mg/dL (ref 8.4–10.5)
GFR calc Af Amer: >90 mL/min (ref >90)
GFR calc non Af Amer: >90 mL/min (ref >90)
Glucose, Bld: 127 mg/dL — ABNORMAL HIGH (ref 70–99)
Sodium: 139 mEq/L (ref 135–145)

## 2011-11-17 LAB — CBC
MCH: 29.6 pg (ref 26.0–34.0)
MCHC: 33.3 g/dL (ref 30.0–36.0)
Platelets: 233 10*3/uL (ref 150–400)
RDW: 13.5 % (ref 11.5–15.5)

## 2011-11-17 LAB — PRO B NATRIURETIC PEPTIDE: Pro B Natriuretic peptide (BNP): 5 pg/mL (ref 0–125)

## 2011-11-17 LAB — POCT I-STAT TROPONIN I: Troponin i, poc: 0 ng/mL (ref 0.00–0.08)

## 2011-11-17 MED ORDER — PANTOPRAZOLE SODIUM 40 MG IV SOLR
40.0000 mg | Freq: Once | INTRAVENOUS | Status: AC
  Administered 2011-11-17: 40 mg via INTRAVENOUS
  Filled 2011-11-17: qty 40

## 2011-11-17 MED ORDER — HYDROMORPHONE HCL PF 1 MG/ML IJ SOLN
1.0000 mg | Freq: Once | INTRAMUSCULAR | Status: AC
  Administered 2011-11-17: 1 mg via INTRAVENOUS
  Filled 2011-11-17: qty 1

## 2011-11-17 MED ORDER — SODIUM CHLORIDE 0.9 % IV SOLN
Freq: Once | INTRAVENOUS | Status: AC
  Administered 2011-11-17: via INTRAVENOUS

## 2011-11-17 MED ORDER — ONDANSETRON HCL 4 MG/2ML IJ SOLN
4.0000 mg | Freq: Once | INTRAMUSCULAR | Status: AC
  Administered 2011-11-17: 4 mg via INTRAVENOUS
  Filled 2011-11-17: qty 2

## 2011-11-17 NOTE — ED Notes (Signed)
Chest and epigastric pain for 1 week.  Md saw pt and thought was "heart burn"

## 2011-11-17 NOTE — ED Notes (Signed)
No BM in 1 week

## 2011-11-18 LAB — HEPATIC FUNCTION PANEL
Albumin: 3.8 g/dL (ref 3.5–5.2)
Bilirubin, Direct: 0.1 mg/dL (ref 0.0–0.3)
Indirect Bilirubin: 0.2 mg/dL — ABNORMAL LOW (ref 0.3–0.9)
Total Bilirubin: 0.3 mg/dL (ref 0.3–1.2)

## 2011-11-18 MED ORDER — HYDROMORPHONE HCL PF 1 MG/ML IJ SOLN
INTRAMUSCULAR | Status: AC
  Administered 2011-11-18: 1 mg
  Filled 2011-11-18: qty 1

## 2011-11-18 MED ORDER — HYDROCODONE-ACETAMINOPHEN 5-325 MG PO TABS: 1.0000 | ORAL_TABLET | Freq: Four times a day (QID) | ORAL | Status: AC | PRN

## 2011-11-18 MED ORDER — ONDANSETRON HCL 4 MG/2ML IJ SOLN
4.0000 mg | Freq: Once | INTRAMUSCULAR | Status: AC
  Administered 2011-11-18: 4 mg via INTRAVENOUS
  Filled 2011-11-18: qty 2

## 2011-11-18 MED ORDER — IOHEXOL 300 MG/ML  SOLN
100.0000 mL | Freq: Once | INTRAMUSCULAR | Status: AC | PRN
  Administered 2011-11-18: 100 mL via INTRAVENOUS

## 2011-11-18 NOTE — ED Provider Notes (Addendum)
History     CSN: [id number]  Arrival date & time 11/17/11  2250   First MD Initiated Contact with Patient 11/17/11 2303      Chief Complaint  Patient presents with  . Chest Pain    (Consider location/radiation/quality/duration/timing/severity/associated sxs/prior treatment) Patient is a 51 y.o. female presenting with chest pain. The history is provided by the patient (the patient complains of severe epigastric pain radiating into her back.). No language interpreter was used.  Chest Pain The chest pain began 3 - 5 hours ago. Chest pain occurs constantly. The chest pain is unchanged. The pain is associated with eating. At its most intense, the pain is at 7/10. The pain is currently at 6/10. The quality of the pain is described as aching. The pain radiates to the mid back. Chest pain is worsened by eating. Pertinent negatives for primary symptoms include no fever, no fatigue, no shortness of breath, no cough and no abdominal pain. Risk factors include obesity.  Pertinent negatives for past medical history include no aneurysm, no MI and no seizures.     History reviewed. No pertinent past medical history.  Past Surgical History  Procedure Date  . Abdominal hysterectomy   . Tubal ligation   . Cesarean section     History reviewed. No pertinent family history.  History  Substance Use Topics  . Smoking status: Never Smoker   . Smokeless tobacco: Not on file  . Alcohol Use: No    OB History    Grav Para Term Preterm Abortions TAB SAB Ect Mult Living                  Review of Systems  Constitutional: Negative for fever and fatigue.  HENT: Negative for congestion, sinus pressure and ear discharge.   Eyes: Negative for discharge.  Respiratory: Negative for cough and shortness of breath.   Cardiovascular: Negative for chest pain.  Gastrointestinal: Negative for abdominal pain and diarrhea.  Genitourinary: Negative for frequency and hematuria.  Musculoskeletal: Negative for  back pain.  Skin: Negative for rash.  Neurological: Negative for seizures and headaches.  Hematological: Negative.   Psychiatric/Behavioral: Negative for hallucinations.    Allergies  Aspirin; Codeine; Peanut-containing drug products; Penicillins; Rubbing alcohol; and Sulfa antibiotics  Home Medications   Current Outpatient Rx  Name Route Sig Dispense Refill  . ALPRAZOLAM 0.5 MG PO TABS Oral Take 0.5 mg by mouth 3 (three) times daily.    Marland Kitchen ESOMEPRAZOLE MAGNESIUM 40 MG PO CPDR Oral Take 40 mg by mouth 2 (two) times daily.    Marland Kitchen LEVOCETIRIZINE DIHYDROCHLORIDE 5 MG PO TABS Oral Take 5 mg by mouth 1 day or 1 dose.    Marland Kitchen HYDROCODONE-ACETAMINOPHEN 5-325 MG PO TABS Oral Take 1 tablet by mouth every 6 (six) hours as needed for pain. 30 tablet 0    BP 114/73  Pulse 75  Resp 17  SpO2 95%  Physical Exam  Constitutional: She is oriented to person, place, and time. She appears well-developed.  HENT:  Head: Normocephalic and atraumatic.  Eyes: Conjunctivae and EOM are normal. No scleral icterus.  Neck: Neck supple. No thyromegaly present.  Cardiovascular: Normal rate and regular rhythm.  Exam reveals no gallop and no friction rub.   No murmur heard. Pulmonary/Chest: No stridor. She has no wheezes. She has no rales. She exhibits no tenderness.  Abdominal: She exhibits no distension. There is tenderness. There is no rebound.       Tender epigastric  Musculoskeletal: Normal range of  motion. She exhibits no edema.  Lymphadenopathy:    She has no cervical adenopathy.  Neurological: She is oriented to person, place, and time. Coordination normal.  Skin: No rash noted. No erythema.  Psychiatric: She has a normal mood and affect. Her behavior is normal.    ED Course  Procedures (including critical care time)  Labs Reviewed  CBC - Abnormal; Notable for the following:    RBC 5.13 (*)    Hemoglobin 15.2 (*)    All other components within normal limits  BASIC METABOLIC PANEL - Abnormal;  Notable for the following:    Glucose, Bld 127 (*)    All other components within normal limits  HEPATIC FUNCTION PANEL - Abnormal; Notable for the following:    Indirect Bilirubin 0.2 (*)    All other components within normal limits  PRO B NATRIURETIC PEPTIDE  POCT I-STAT TROPONIN I  LIPASE, BLOOD  CBC  DIFFERENTIAL  COMPREHENSIVE METABOLIC PANEL  HEPATIC FUNCTION PANEL   Ct Abdomen Pelvis W Contrast  11/18/2011  *RADIOLOGY REPORT*  Clinical Data: Epigastric pain for 7 days, no bowel movement for 1 week  CT ABDOMEN AND PELVIS WITH CONTRAST  Technique:  Multidetector CT imaging of the abdomen and pelvis was performed following the standard protocol during bolus administration of intravenous contrast. Sagittal and coronal MPR images reconstructed from axial data set.  Contrast: 137m OMNIPAQUE IOHEXOL 300 MG/ML IV SOLN Dilute oral contrast.  Comparison: 04/28/2010  Findings: Lung bases clear. 5 mm right lobe hepatic cyst stable image 28. Remainder of liver, spleen, pancreas, kidneys, and adrenal glands normal appearance. Stomach is distended though contrast is present within the small bowel loops. Small umbilical hernia containing fat. Normal appendix. Uterus surgically absent with normal sized left ovary not visualized right ovary. Questionable mild bowel wall thickening involving the duodenal bulb though similar to prior studies. Large and small bowel loops otherwise normal appearance. No mass, adenopathy, free fluid, or inflammatory process. No acute osseous findings.  IMPRESSION: No acute abnormalities.  Original Report Authenticated By: MBurnetta Sabin M.D.     1. Abdominal pain       MDM  pud        JMaudry Diego MD 11/18/11 0300  JMaudry Diego MD 11/18/11 0300

## 2011-11-18 NOTE — ED Notes (Signed)
Pt complaining of increased nausea since drinking the contrast. edp notified. Verbal order for nausea meds given.

## 2012-01-24 ENCOUNTER — Encounter (HOSPITAL_COMMUNITY): Payer: Self-pay | Admitting: *Deleted

## 2012-01-24 DIAGNOSIS — R070 Pain in throat: Secondary | ICD-10-CM | POA: Insufficient documentation

## 2012-01-24 DIAGNOSIS — J069 Acute upper respiratory infection, unspecified: Secondary | ICD-10-CM | POA: Insufficient documentation

## 2012-01-24 DIAGNOSIS — R509 Fever, unspecified: Secondary | ICD-10-CM | POA: Insufficient documentation

## 2012-01-24 DIAGNOSIS — R630 Anorexia: Secondary | ICD-10-CM | POA: Insufficient documentation

## 2012-01-24 DIAGNOSIS — R490 Dysphonia: Secondary | ICD-10-CM | POA: Insufficient documentation

## 2012-01-24 DIAGNOSIS — R05 Cough: Secondary | ICD-10-CM | POA: Insufficient documentation

## 2012-01-24 DIAGNOSIS — R059 Cough, unspecified: Secondary | ICD-10-CM | POA: Insufficient documentation

## 2012-01-24 DIAGNOSIS — R071 Chest pain on breathing: Secondary | ICD-10-CM | POA: Insufficient documentation

## 2012-01-24 DIAGNOSIS — Z79899 Other long term (current) drug therapy: Secondary | ICD-10-CM | POA: Insufficient documentation

## 2012-01-24 NOTE — ED Notes (Signed)
Fever, hoarse, chest and back pain,  Cough, nonproductive.

## 2012-01-25 ENCOUNTER — Emergency Department (HOSPITAL_COMMUNITY): Payer: BC Managed Care – PPO

## 2012-01-25 ENCOUNTER — Emergency Department (HOSPITAL_COMMUNITY)
Admission: EM | Admit: 2012-01-25 | Discharge: 2012-01-25 | Disposition: A | Discharge status: Home / Self Care | Payer: BC Managed Care – PPO | Attending: Emergency Medicine | Admitting: Emergency Medicine

## 2012-01-25 DIAGNOSIS — J069 Acute upper respiratory infection, unspecified: Secondary | ICD-10-CM

## 2012-01-25 HISTORY — DX: Other allergy status, other than to drugs and biological substances: Z91.09

## 2012-01-25 LAB — URINALYSIS, ROUTINE W REFLEX MICROSCOPIC
Ketones, ur: NEGATIVE mg/dL
Leukocytes, UA: NEGATIVE
Protein, ur: NEGATIVE mg/dL
Urobilinogen, UA: 0.2 mg/dL (ref 0.0–1.0)

## 2012-01-25 MED ORDER — HYDROCOD POLST-CHLORPHEN POLST 10-8 MG/5ML PO LQCR: 5.0000 mL | Freq: Two times a day (BID) | ORAL | Status: DC

## 2012-01-25 NOTE — ED Notes (Signed)
EDP at bedside to examine.

## 2012-01-25 NOTE — Discharge Instructions (Signed)
Get plenty of rest and drink a lot of fluids. Use. The cough medicine as needed for coughing. Do not drive or work when taking the cough medicine. See the Dr. Berneice Gandy your choice for problems.    Saline Nose Drops  To help clear a stuffy nose, put salt water (saline) nose drops in your infant's nose. This helps to loosen the secretions in the nose. Use a bulb syringe to clean the nose out:  Before feeding.   Before putting your infant down for naps.   No more than once every 3 hours to avoid irritating your infant's nostrils.  HOME CARE  Buy nose drops at your local drug store. You can also make nose drops yourself. Mix 1 cup of water with  teaspoon of salt. Stir. Store this mixture at room temperature. Make a new batch daily.   To use the drops:   Put 1 or 2 drops in each side of infant's nose with a clean medicine dropper. Do not use this dropper for any other medicine.   Squeeze the air out of the suction bulb before inserting it into your infant's nose.   While still squeezing the bulb flat, place the tip of the bulb into a nostril. Let air come back into the bulb. The suction will pull snot out of the nose and into the bulb.   Repeat on other nostril.   Squeeze the bulb several times into a tissue and wash the bulb tip in soapy water. Store the bulb with the tip side down on paper towel.   Use the bulb syringe with only the saline drops to avoid irritating your infant's nostrils.  GET HELP RIGHT AWAY IF:  The snot changes to green or yellow.   The snot gets thicker.   Your infant is 3 months or younger with a rectal temperature of 100.4 F (38 C) or higher.   Your infant is older than 3 months with a rectal temperature of 102 F (38.9 C) or higher.   The stuffy nose lasts 10 days or longer.   There is trouble breathing or feeding.  MAKE SURE YOU:  Understand these instructions.   Will watch your infant's condition.   Will get help right away if your infant is not  doing well or gets worse.  Document Released: 08/01/2009 Document Revised: 09/23/2011 Document Reviewed: 08/01/2009 [facility] Patient Information 2012 [place].Upper Respiratory Infection, Adult An upper respiratory infection (URI) is also sometimes known as the common cold. The upper respiratory tract includes the nose, sinuses, throat, trachea, and bronchi. Bronchi are the airways leading to the lungs. Most people improve within 1 week, but symptoms can last up to 2 weeks. A residual cough may last even longer.  CAUSES Many different viruses can infect the tissues lining the upper respiratory tract. The tissues become irritated and inflamed and often become very moist. Mucus production is also common. A cold is contagious. You can easily spread the virus to others by oral contact. This includes kissing, sharing a glass, coughing, or sneezing. Touching your mouth or nose and then touching a surface, which is then touched by another person, can also spread the virus. SYMPTOMS  Symptoms typically develop 1 to 3 days after you come in contact with a cold virus. Symptoms vary from person to person. They may include:  Runny nose.   Sneezing.   Nasal congestion.   Sinus irritation.   Sore throat.   Loss of voice (laryngitis).   Cough.  Fatigue.   Muscle aches.   Loss of appetite.   Headache.   Low-grade fever.  DIAGNOSIS  You might diagnose your own cold based on familiar symptoms, since most people get a cold 2 to 3 times a year. Your caregiver can confirm this based on your exam. Most importantly, your caregiver can check that your symptoms are not due to another disease such as strep throat, sinusitis, pneumonia, asthma, or epiglottitis. Blood tests, throat tests, and X-rays are not necessary to diagnose a common cold, but they may sometimes be helpful in excluding other more serious diseases. Your caregiver will decide if any further tests are required. RISKS AND COMPLICATIONS   You may be at risk for a more severe case of the common cold if you smoke cigarettes, have chronic heart disease (such as heart failure) or lung disease (such as asthma), or if you have a weakened immune system. The very young and very old are also at risk for more serious infections. Bacterial sinusitis, middle ear infections, and bacterial pneumonia can complicate the common cold. The common cold can worsen asthma and chronic obstructive pulmonary disease (COPD). Sometimes, these complications can require emergency medical care and may be life-threatening. PREVENTION  The best way to protect against getting a cold is to practice good hygiene. Avoid oral or hand contact with people with cold symptoms. Wash your hands often if contact occurs. There is no clear evidence that vitamin C, vitamin E, echinacea, or exercise reduces the chance of developing a cold. However, it is always recommended to get plenty of rest and practice good nutrition. TREATMENT  Treatment is directed at relieving symptoms. There is no cure. Antibiotics are not effective, because the infection is caused by a virus, not by bacteria. Treatment may include:  Increased fluid intake. Sports drinks offer valuable electrolytes, sugars, and fluids.   Breathing heated mist or steam (vaporizer or shower).   Eating chicken soup or other clear broths, and maintaining good nutrition.   Getting plenty of rest.   Using gargles or lozenges for comfort.   Controlling fevers with ibuprofen or acetaminophen as directed by your caregiver.   Increasing usage of your inhaler if you have asthma.  Zinc gel and zinc lozenges, taken in the first 24 hours of the common cold, can shorten the duration and lessen the severity of symptoms. Pain medicines may help with fever, muscle aches, and throat pain. A variety of non-prescription medicines are available to treat congestion and runny nose. Your caregiver can make recommendations and may suggest  nasal or lung inhalers for other symptoms.  HOME CARE INSTRUCTIONS   Only take over-the-counter or prescription medicines for pain, discomfort, or fever as directed by your caregiver.   Use a warm mist humidifier or inhale steam from a shower to increase air moisture. This may keep secretions moist and make it easier to breathe.   Drink enough water and fluids to keep your urine clear or pale yellow.   Rest as needed.   Return to work when your temperature has returned to normal or as your caregiver advises. You may need to stay home longer to avoid infecting others. You can also use a face mask and careful hand washing to prevent spread of the virus.  SEEK MEDICAL CARE IF:   After the first few days, you feel you are getting worse rather than better.   You need your caregiver's advice about medicines to control symptoms.   You develop chills, worsening shortness of breath,  or brown or red sputum. These may be signs of pneumonia.   You develop yellow or brown nasal discharge or pain in the face, especially when you bend forward. These may be signs of sinusitis.   You develop a fever, swollen neck glands, pain with swallowing, or white areas in the back of your throat. These may be signs of strep throat.  SEEK IMMEDIATE MEDICAL CARE IF:   You have a fever.   You develop severe or persistent headache, ear pain, sinus pain, or chest pain.   You develop wheezing, a prolonged cough, cough up blood, or have a change in your usual mucus (if you have chronic lung disease).   You develop sore muscles or a stiff neck.  Document Released: 03/30/2001 Document Revised: 09/23/2011 Document Reviewed: 02/05/2011 [facility] Patient Information 2012 [place].

## 2012-01-25 NOTE — ED Provider Notes (Signed)
History     CSN: [id number]  Arrival date & time 01/24/12  2207   First MD Initiated Contact with Patient 01/25/12 0157      Chief Complaint  Patient presents with  . Fever    (Consider location/radiation/quality/duration/timing/severity/associated sxs/prior treatment) HPI Comments: Kathryn Olson is a 51 y.o. Female with cough for 2 weeks follow by onset right flank pain yesterday. She is also having fever, hoarseness, and anorexia. She denies nausea or vomiting. She has no abdominal pain. She feels general achiness. She has been using her medications without relief. She had to miss work today because of the discomfort  Patient is a 51 y.o. female presenting with fever. The history is provided by the patient and a relative.  Fever Primary symptoms of the febrile illness include fever.    Past Medical History  Diagnosis Date  . Environmental allergies     Past Surgical History  Procedure Date  . Abdominal hysterectomy   . Tubal ligation   . Cesarean section     History reviewed. No pertinent family history.  History  Substance Use Topics  . Smoking status: Never Smoker   . Smokeless tobacco: Not on file  . Alcohol Use: No    OB History    Grav Para Term Preterm Abortions TAB SAB Ect Mult Living                  Review of Systems  Constitutional: Positive for fever.  All other systems reviewed and are negative.    Allergies  Aspirin; Codeine; Peanut-containing drug products; Penicillins; Rubbing alcohol; and Sulfa antibiotics  Home Medications   Current Outpatient Rx  Name Route Sig Dispense Refill  . ALPRAZOLAM 0.5 MG PO TABS Oral Take 0.5 mg by mouth 3 (three) times daily.    Marland Kitchen HYDROCOD POLST-CPM POLST ER 10-8 MG/5ML PO LQCR Oral Take 5 mLs by mouth every 12 (twelve) hours. 140 mL 0  . ESOMEPRAZOLE MAGNESIUM 40 MG PO CPDR Oral Take 40 mg by mouth 2 (two) times daily.    Marland Kitchen LEVOCETIRIZINE DIHYDROCHLORIDE 5 MG PO TABS Oral Take 5 mg by mouth 1 day or 1  dose.      BP 130/70  Pulse 68  Temp(Src) 98.4 F (36.9 C) (Oral)  Resp 20  Ht 5' 2"  (1.575 m)  Wt 165 lb (74.844 kg)  BMI 30.18 kg/m2  SpO2 99%  Physical Exam  Nursing note and vitals reviewed. Constitutional: She is oriented to person, place, and time. She appears well-developed and well-nourished. She appears distressed.  HENT:  Head: Normocephalic and atraumatic.       Voice is hoarse.  Eyes: Conjunctivae and EOM are normal. Pupils are equal, round, and reactive to light.  Neck: Normal range of motion and phonation normal. Neck supple.       No stridor  Cardiovascular: Normal rate, regular rhythm and intact distal pulses.   Pulmonary/Chest: Effort normal and breath sounds normal. She has no wheezes. She has no rales. She exhibits no tenderness.  Abdominal: Soft. She exhibits no distension. There is no tenderness. There is no guarding.  Genitourinary:       Mild right costovertebral angle tenderness.  Musculoskeletal: Normal range of motion.       Mild bilateral posterior chest wall pain to light touch  Neurological: She is alert and oriented to person, place, and time. She has normal strength. She exhibits normal muscle tone.  Skin: Skin is warm and dry.  Psychiatric: She has  a normal mood and affect. Her behavior is normal. Judgment and thought content normal.    ED Course  Procedures (including critical care time)  Labs Reviewed  URINALYSIS, ROUTINE W REFLEX MICROSCOPIC - Abnormal; Notable for the following:    APPearance HAZY (*)    Specific Gravity, Urine >1.030 (*)    All other components within normal limits   Dg Chest 2 View  01/25/2012  *RADIOLOGY REPORT*  Clinical Data: Cough, fever, sore throat and chills.  Nonsmoker.  CHEST - 2 VIEW  Comparison: 05/14/2010.  Findings: No infiltrate, congestive heart failure or pneumothorax.  Central pulmonary vascular prominence unchanged.  Heart size top normal.  IMPRESSION: No infiltrate.  Original Report Authenticated By:  Doug Sou, M.D.    Date: 01/25/2012  Rate: 64  Rhythm: normal sinus rhythm  QRS Axis: normal  Intervals: normal  ST/T Wave abnormalities: normal  Conduction Disutrbances:none  Narrative Interpretation:   Old EKG Reviewed: none available and unchanged    1. URI (upper respiratory infection)       MDM  URI symptoms, without evidence of shift bacterial infection, metabolic instability or apparent, acute coronary syndrome.       Plan: Home Medications- Tussionex; Home Treatments- rest, fluids; Recommended follow up- PCP of choice prn  Richarda Blade, MD 01/25/12 [phone]

## 2012-01-25 NOTE — ED Notes (Signed)
C/o cough x 1 week with that starts at posterior ribs and radiates around under right breast.

## 2012-01-25 NOTE — ED Notes (Signed)
Left in c/o spouse for transport home; alert, in no distress; instructions/prescriptions reviewed and f/u info provided.  Verbalizes understanding.

## 2012-03-18 DIAGNOSIS — K5090 Crohn's disease, unspecified, without complications: Secondary | ICD-10-CM

## 2012-03-18 HISTORY — DX: Crohn's disease, unspecified, without complications: K50.90

## 2012-04-25 ENCOUNTER — Encounter (HOSPITAL_COMMUNITY): Payer: Self-pay | Admitting: Emergency Medicine

## 2012-04-25 ENCOUNTER — Emergency Department (HOSPITAL_COMMUNITY): Payer: BC Managed Care – PPO

## 2012-04-25 ENCOUNTER — Emergency Department (HOSPITAL_COMMUNITY)
Admission: EM | Admit: 2012-04-25 | Discharge: 2012-04-25 | Disposition: A | Discharge status: Home / Self Care | Payer: BC Managed Care – PPO | Attending: Emergency Medicine | Admitting: Emergency Medicine

## 2012-04-25 DIAGNOSIS — K529 Noninfective gastroenteritis and colitis, unspecified: Secondary | ICD-10-CM

## 2012-04-25 DIAGNOSIS — R10819 Abdominal tenderness, unspecified site: Secondary | ICD-10-CM | POA: Insufficient documentation

## 2012-04-25 DIAGNOSIS — R109 Unspecified abdominal pain: Secondary | ICD-10-CM | POA: Insufficient documentation

## 2012-04-25 DIAGNOSIS — Z90710 Acquired absence of both cervix and uterus: Secondary | ICD-10-CM | POA: Insufficient documentation

## 2012-04-25 DIAGNOSIS — K5289 Other specified noninfective gastroenteritis and colitis: Secondary | ICD-10-CM | POA: Insufficient documentation

## 2012-04-25 HISTORY — DX: Anxiety disorder, unspecified: F41.9

## 2012-04-25 LAB — URINE MICROSCOPIC-ADD ON

## 2012-04-25 LAB — COMPREHENSIVE METABOLIC PANEL
AST: 21 U/L (ref 0–37)
Albumin: 4.4 g/dL (ref 3.5–5.2)
Alkaline Phosphatase: 91 U/L (ref 39–117)
Chloride: 101 mEq/L (ref 96–112)
Creatinine, Ser: 0.89 mg/dL (ref 0.50–1.10)
Potassium: 4.4 mEq/L (ref 3.5–5.1)
Total Bilirubin: 0.3 mg/dL (ref 0.3–1.2)
Total Protein: 7.9 g/dL (ref 6.0–8.3)

## 2012-04-25 LAB — URINALYSIS, ROUTINE W REFLEX MICROSCOPIC
Glucose, UA: NEGATIVE mg/dL
Ketones, ur: NEGATIVE mg/dL
Leukocytes, UA: NEGATIVE
Nitrite: NEGATIVE
Specific Gravity, Urine: >1.03 — ABNORMAL HIGH (ref 1.005–1.030)
pH: 5.5 (ref 5.0–8.0)

## 2012-04-25 LAB — CBC
MCHC: 33.8 g/dL (ref 30.0–36.0)
Platelets: 267 10*3/uL (ref 150–400)
RDW: 13.5 % (ref 11.5–15.5)
WBC: 9.6 10*3/uL (ref 4.0–10.5)

## 2012-04-25 LAB — LIPASE, BLOOD: Lipase: 21 U/L (ref 11–59)

## 2012-04-25 MED ORDER — HYDROMORPHONE HCL PF 1 MG/ML IJ SOLN
1.0000 mg | Freq: Once | INTRAMUSCULAR | Status: AC
  Administered 2012-04-25: 1 mg via INTRAVENOUS
  Filled 2012-04-25: qty 1

## 2012-04-25 MED ORDER — CIPROFLOXACIN HCL 500 MG PO TABS: 500.0000 mg | ORAL_TABLET | Freq: Two times a day (BID) | ORAL | Status: AC

## 2012-04-25 MED ORDER — ONDANSETRON HCL 4 MG/2ML IJ SOLN
4.0000 mg | Freq: Once | INTRAMUSCULAR | Status: AC
  Administered 2012-04-25: 4 mg via INTRAVENOUS
  Filled 2012-04-25: qty 2

## 2012-04-25 MED ORDER — METRONIDAZOLE 500 MG PO TABS: 500.0000 mg | ORAL_TABLET | Freq: Two times a day (BID) | ORAL | Status: AC

## 2012-04-25 MED ORDER — SODIUM CHLORIDE 0.9 % IV BOLUS (SEPSIS)
1000.0000 mL | Freq: Once | INTRAVENOUS | Status: AC
  Administered 2012-04-25: 1000 mL via INTRAVENOUS

## 2012-04-25 MED ORDER — IOHEXOL 300 MG/ML  SOLN
100.0000 mL | Freq: Once | INTRAMUSCULAR | Status: AC | PRN
  Administered 2012-04-25: 100 mL via INTRAVENOUS

## 2012-04-25 MED ORDER — PROMETHAZINE HCL 25 MG PO TABS: 25.0000 mg | ORAL_TABLET | Freq: Four times a day (QID) | ORAL | Status: DC | PRN

## 2012-04-25 MED ORDER — PROMETHAZINE HCL 25 MG/ML IJ SOLN
25.0000 mg | Freq: Once | INTRAMUSCULAR | Status: AC
  Administered 2012-04-25: 25 mg via INTRAVENOUS
  Filled 2012-04-25: qty 1

## 2012-04-25 MED ORDER — CIPROFLOXACIN IN D5W 400 MG/200ML IV SOLN
400.0000 mg | Freq: Once | INTRAVENOUS | Status: AC
  Administered 2012-04-25: 400 mg via INTRAVENOUS
  Filled 2012-04-25: qty 200

## 2012-04-25 MED ORDER — METRONIDAZOLE 500 MG PO TABS
500.0000 mg | ORAL_TABLET | Freq: Once | ORAL | Status: AC
  Administered 2012-04-25: 500 mg via ORAL
  Filled 2012-04-25: qty 1

## 2012-04-25 MED ORDER — HYDROCODONE-ACETAMINOPHEN 5-500 MG PO TABS: 1.0000 | ORAL_TABLET | Freq: Four times a day (QID) | ORAL | Status: AC | PRN

## 2012-04-25 NOTE — ED Notes (Signed)
Patient states that she is having lower abdominal pain. Also complains of nausea. States her abdomen was swelling up yesterday. Also reports loosing approximately 30 lbs over the past month. States thought she was constipated so she took laxatives, and is now having diarrhea.

## 2012-04-25 NOTE — ED Provider Notes (Signed)
History     CSN: [id number]  Arrival date & time 04/25/12  0052   First MD Initiated Contact with Patient 04/25/12 [phone]      Chief Complaint  Patient presents with  . Abdominal Pain    (Consider location/radiation/quality/duration/timing/severity/associated sxs/prior treatment) Patient is a 51 y.o. female presenting with abdominal pain.  Abdominal Pain The primary symptoms of the illness include abdominal pain. The primary symptoms of the illness do not include fever, shortness of breath or dysuria.  Symptoms associated with the illness do not include chills or back pain.   Hx per PT, lower ABD pain and nausea on and off last few weeks worse over the last 24 hours. No F/C, no vomting or diarrhea. Some loose stools after taking laxatives for discomfort. No blood in stools, no h/o same. No recent ABx or travel. No trauma. Mod in severity, pain is sharp and cramping, constant tonight. Followed by Dr Hassell Done. No h/o same. No vag bleeding or discharge, h/o previous hysto.  Past Medical History  Diagnosis Date  . Environmental allergies   . Anxiety     Past Surgical History  Procedure Date  . Abdominal hysterectomy   . Tubal ligation   . Cesarean section     History reviewed. No pertinent family history.  History  Substance Use Topics  . Smoking status: Never Smoker   . Smokeless tobacco: Not on file  . Alcohol Use: Yes     socially    OB History    Grav Para Term Preterm Abortions TAB SAB Ect Mult Living                  Review of Systems  Constitutional: Negative for fever and chills.  HENT: Negative for neck pain and neck stiffness.   Eyes: Negative for pain.  Respiratory: Negative for shortness of breath.   Cardiovascular: Negative for chest pain.  Gastrointestinal: Positive for abdominal pain.  Genitourinary: Negative for dysuria.  Musculoskeletal: Negative for back pain.  Skin: Negative for rash.  Neurological: Negative for headaches.  All other systems  reviewed and are negative.    Allergies  Aspirin; Codeine; Peanut-containing drug products; Penicillins; Rubbing alcohol; and Sulfa antibiotics  Home Medications   Current Outpatient Rx  Name Route Sig Dispense Refill  . ALPRAZOLAM 0.5 MG PO TABS Oral Take 0.5 mg by mouth 3 (three) times daily.    Marland Kitchen ESOMEPRAZOLE MAGNESIUM 40 MG PO CPDR Oral Take 40 mg by mouth 2 (two) times daily.    Marland Kitchen LEVOCETIRIZINE DIHYDROCHLORIDE 5 MG PO TABS Oral Take 5 mg by mouth 1 day or 1 dose.    Marland Kitchen HYDROCOD POLST-CPM POLST ER 10-8 MG/5ML PO LQCR Oral Take 5 mLs by mouth every 12 (twelve) hours. 140 mL 0    BP 127/68  Pulse 55  Temp 97.9 F (36.6 C) (Oral)  Resp 12  Ht 5' 2"  (1.575 m)  Wt 160 lb (72.576 kg)  BMI 29.26 kg/m2  SpO2 97%  Physical Exam  Constitutional: She is oriented to person, place, and time. She appears well-developed and well-nourished.  HENT:  Head: Normocephalic and atraumatic.  Eyes: Conjunctivae and EOM are normal. Pupils are equal, round, and reactive to light.  Neck: Trachea normal. Neck supple. No thyromegaly present.  Cardiovascular: Normal rate, regular rhythm, S1 normal, S2 normal and normal pulses.     No systolic murmur is present   No diastolic murmur is present  Pulses:      Radial pulses are 2+  on the right side, and 2+ on the left side.  Pulmonary/Chest: Effort normal and breath sounds normal. She has no wheezes. She has no rhonchi. She has no rales. She exhibits no tenderness.  Abdominal: Soft. Normal appearance and bowel sounds are normal. There is no CVA tenderness and negative Murphy's sign.       Localizes pain lower ABD with mild midline and RLQ and LLQ tenderness.   Musculoskeletal:       BLE:s Calves nontender, no cords or erythema, negative Homans sign  Neurological: She is alert and oriented to person, place, and time. She has normal strength. No cranial nerve deficit or sensory deficit. GCS eye subscore is 4. GCS verbal subscore is 5. GCS motor subscore  is 6.  Skin: Skin is warm and dry. No rash noted. She is not diaphoretic.  Psychiatric: Her speech is normal.       Cooperative and appropriate    ED Course  Procedures (including critical care time)  Labs Reviewed  CBC - Abnormal; Notable for the following:    RBC 5.25 (*)     Hemoglobin 15.7 (*)     HCT 46.4 (*)     All other components within normal limits  COMPREHENSIVE METABOLIC PANEL - Abnormal; Notable for the following:    Glucose, Bld 158 (*)     GFR calc non Af Amer 73 (*)     GFR calc Af Amer 85 (*)     All other components within normal limits  URINALYSIS, ROUTINE W REFLEX MICROSCOPIC - Abnormal; Notable for the following:    Specific Gravity, Urine >1.030 (*)     Hgb urine dipstick MODERATE (*)     Bilirubin Urine SMALL (*)     Protein, ur 30 (*)     All other components within normal limits  URINE MICROSCOPIC-ADD ON - Abnormal; Notable for the following:    Bacteria, UA FEW (*)     Casts HYALINE CASTS (*)     All other components within normal limits  LIPASE, BLOOD   Ct Abdomen Pelvis W Contrast  04/25/2012  *RADIOLOGY REPORT*  Clinical Data: Lower abdominal pain and nausea.  30 pounds of weight loss over the past month.  CT ABDOMEN AND PELVIS WITH CONTRAST  Technique:  Multidetector CT imaging of the abdomen and pelvis was performed following the standard protocol during bolus administration of intravenous contrast.  Contrast: 157m OMNIPAQUE IOHEXOL 300 MG/ML  SOLN  Comparison: CT of the abdomen and pelvis performed 11/18/2011  Findings: The visualized lung bases are clear.  A hypodensity noted at the periphery of the right hepatic lobe, nonspecific but likely reflecting a small cyst.  The liver and spleen are otherwise unremarkable in appearance.  The gallbladder is within normal limits.  The pancreas and adrenal glands are unremarkable.  A 0.5 cm cyst is noted at the medial aspect of the left kidney. The kidneys are otherwise grossly unremarkable appearance.  There  is no evidence of hydronephrosis.  No renal or ureteral stones are seen.  No perinephric stranding is appreciated.  No free fluid is identified.  The small bowel is unremarkable in appearance.  The stomach is within normal limits.  No acute vascular abnormalities are seen.  There is diffuse fat infiltration of the wall of the cecum and ascending colon, possibly reflecting chronic sequelae of inflammation; this is grossly stable from the prior study.  The appendix is normal in caliber and contains air, without evidence for appendicitis.  The remainder  of the colon is unremarkable in appearance.  A few diverticula are noted at the cecum.  The bladder is mildly distended; urine in the bladder demonstrates mildly increased attenuation, of uncertain significance.  The patient is status post hysterectomy; no suspicious adnexal masses are seen.  No inguinal lymphadenopathy is seen.  No acute osseous abnormalities are identified.  A partially characterized metallic density at the labia appears to reflect a piercing.  IMPRESSION:  1.  No acute abnormalities seen within the abdomen or pelvis. 2.  Diffuse fat infiltration of the wall of the cecum and ascending colon, relatively stable from the prior study and likely reflecting chronic sequelae of inflammation.  No evidence of acute bowel abnormality.  3.  Likely small hepatic and left renal cysts. 4.  Few diverticula noted at the cecum. 5.  Mildly increased attenuation of urine in the bladder, of uncertain significance.  Original Report Authenticated By: Santa Lighter, M.D.   IV dilaudid. IV zofran. IV phenergan.   On recheck after medications feeling improved. ABD remains soft without peritonitis.  MDM   Lower ABD pain with colitis on CT  IVFs, pain medications and antiemetics. UA reviewed WNL, CT scan ordered and did vomitn after PO contrast. Serial evaluations, improving condition. No h/o colitis. Given CT results, concern for more acute colitis with symptoms.  Plan ABx and close PCP follo wup. GI referral provided.  RX pain medications and phenergan, cipro and flagyl.          Teressa Lower, MD 04/25/12 [phone]

## 2012-04-25 NOTE — ED Notes (Signed)
Patient vomited 1st bottle of contrast, dr. Marnette Burgess notified. Stated for patient to have CT done with IV contrast only

## 2012-04-25 NOTE — ED Notes (Signed)
Pt experienced episode of emesis. Dr Marnette Burgess notified and stated to call CT and do the scan without oral contrast. Pt given 90m Zofran per orders.

## 2012-04-25 NOTE — ED Notes (Signed)
Cipro infusion currently infusing

## 2012-05-10 ENCOUNTER — Ambulatory Visit (INDEPENDENT_AMBULATORY_CARE_PROVIDER_SITE_OTHER): Payer: BC Managed Care – PPO | Admitting: Gastroenterology

## 2012-05-10 ENCOUNTER — Encounter: Payer: Self-pay | Admitting: Gastroenterology

## 2012-05-10 ENCOUNTER — Telehealth: Payer: Self-pay | Admitting: General Practice

## 2012-05-10 VITALS — BP 147/83 | HR 69 | Temp 98.4°F | Ht 62.0 in | Wt 175.0 lb

## 2012-05-10 DIAGNOSIS — K5900 Constipation, unspecified: Secondary | ICD-10-CM | POA: Insufficient documentation

## 2012-05-10 DIAGNOSIS — K219 Gastro-esophageal reflux disease without esophagitis: Secondary | ICD-10-CM

## 2012-05-10 DIAGNOSIS — R1110 Vomiting, unspecified: Secondary | ICD-10-CM | POA: Insufficient documentation

## 2012-05-10 DIAGNOSIS — R933 Abnormal findings on diagnostic imaging of other parts of digestive tract: Secondary | ICD-10-CM | POA: Insufficient documentation

## 2012-05-10 DIAGNOSIS — R1310 Dysphagia, unspecified: Secondary | ICD-10-CM | POA: Insufficient documentation

## 2012-05-10 DIAGNOSIS — R634 Abnormal weight loss: Secondary | ICD-10-CM | POA: Insufficient documentation

## 2012-05-10 DIAGNOSIS — R109 Unspecified abdominal pain: Secondary | ICD-10-CM | POA: Insufficient documentation

## 2012-05-10 DIAGNOSIS — R1314 Dysphagia, pharyngoesophageal phase: Secondary | ICD-10-CM

## 2012-05-10 MED ORDER — LINACLOTIDE 145 MCG PO CAPS: 145.0000 ug | ORAL_CAPSULE | Freq: Every day | ORAL | Status: DC

## 2012-05-10 NOTE — Progress Notes (Signed)
Primary Care Physician:  Redge Gainer, MD  Primary Gastroenterologist: Barney Drain, MD   Chief Complaint  Patient presents with  . Ulcerative Colitis    HPI:  Kathryn Olson is a 51 y.o. female here for further evaluation of couple months of abdominal pain, anorexia, vomiting, weight loss. Symptoms are worse with meals. Left abdominal pain worse with eating. Frequent vomiting and anorexia. If she has no appetite she forces liquids. Denies hematemesis. No fever. BM chronically constipated. No diarrhea. Sometimes no BM 2-3 weeks. Used to take milk of magnesia daily until the vomiting started. No melena, brbpr. Weight down from 217 to 175 in last couple of months. Forcing liquids most of the time. Sometimes heartburn, especially with spices. Solid food dysphagia. Food has to be very soft. Hard to swallow pills. No prior colonoscopy. Remote EGD for ulcers per patient.   Current Outpatient Prescriptions  Medication Sig Dispense Refill  . ALPRAZolam (XANAX) 0.5 MG tablet Take 0.5 mg by mouth 3 (three) times daily.      . chlorpheniramine-HYDROcodone (TUSSIONEX PENNKINETIC ER) 10-8 MG/5ML LQCR Take 5 mLs by mouth every 12 (twelve) hours.  140 mL  0  . esomeprazole (NEXIUM) 40 MG capsule Take 40 mg by mouth as needed.       . flintstones complete (FLINTSTONES) 60 MG chewable tablet Chew 1 tablet by mouth daily.      Marland Kitchen levocetirizine (XYZAL) 5 MG tablet Take 5 mg by mouth 1 day or 1 dose.      . promethazine (PHENERGAN) 25 MG tablet Take 25 mg by mouth every 6 (six) hours as needed.      Marland Kitchen DISCONTD: promethazine (PHENERGAN) 25 MG tablet Take 1 tablet (25 mg total) by mouth every 6 (six) hours as needed for nausea.  30 tablet  0    Allergies as of 05/10/2012 - Review Complete 05/10/2012  Allergen Reaction Noted  . Peanut-containing drug products Anaphylaxis and Other (See Comments) 11/17/2011  . Aspirin Hives 11/17/2011  . Codeine Itching 11/17/2011  . Penicillins Other (See Comments) 11/17/2011    . Sulfa antibiotics Itching 11/17/2011  . Rubbing alcohol (alcohol) Rash 11/17/2011    Past Medical History  Diagnosis Date  . Environmental allergies   . Anxiety     Past Surgical History  Procedure Date  . Partial hysterectomy   . Tubal ligation   . Cesarean section     X2  . Bladder surgery     age 22  . Esophagogastroduodenoscopy     remote, had ulcers, [place], Dr. Truddie Coco    Family History  Problem Relation Age of Onset  . Colon cancer Maternal Grandfather     ?less than age 12s  . Heart attack Father   . Diabetes Mother   . Inflammatory bowel disease Neg Hx   . Liver disease Brother     does know details, but related to MVA?    History   Social History  . Marital Status: Married    Spouse Name: N/A    Number of Children: 2  . Years of Education: N/A   Occupational History  . plant, manual labor, lifting    Social History Main Topics  . Smoking status: Never Smoker   . Smokeless tobacco: Not on file  . Alcohol Use: Yes     socially  . Drug Use: No  . Sexually Active: Yes    Birth Control/ Protection: Surgical   Other Topics Concern  . Not on file   Social  History Narrative  . No narrative on file      ROS:  General: see history of present illness. Negative for fever, chills, fatigue, weakness. Eyes: Negative for vision changes.  ENT: Negative for hoarseness, nasal congestion. CV: Negative for chest pain, angina, palpitations, dyspnea on exertion, peripheral edema.  Respiratory: Negative for dyspnea at rest, dyspnea on exertion, cough, sputum, wheezing.  GI: See history of present illness. GU:  Negative for dysuria, hematuria, urinary incontinence, urinary frequency, nocturnal urination.  MS: Negative for joint pain, low back pain.  Derm: Negative for rash or itching.  Neuro: Negative for weakness, abnormal sensation, seizure, frequent headaches, memory loss, confusion.  Psych: complains of anxiety attacks. Negative for  depression,  suicidal ideation, hallucinations.  Endo: see history of present illness Heme: Negative for bruising or bleeding. Allergy: Negative for rash or hives.    Physical Examination:  BP 147/83  Pulse 69  Temp 98.4 F (36.9 C) (Temporal)  Ht 5' 2"  (1.575 m)  Wt 175 lb (79.379 kg)  BMI 32.01 kg/m2   General: Well-nourished, well-developed in no acute distress.  Head: Normocephalic, atraumatic.   Eyes: Conjunctiva pink, no icterus. Mouth: Oropharyngeal mucosa moist and pink , no lesions erythema or exudate. Neck: Supple without thyromegaly, masses, or lymphadenopathy.  Lungs: Clear to auscultation bilaterally.  Heart: Regular rate and rhythm, no murmurs rubs or gallops.  Abdomen: Bowel sounds are normal, diffuse abdominal tenderness more in the epigastric and central region, nondistended, no hepatosplenomegaly or masses, no abdominal bruits or    hernia , no rebound or guarding.   Rectal: deferred Extremities: No lower extremity edema. No clubbing or deformities.  Neuro: Alert and oriented x 4 , grossly normal neurologically.  Skin: Warm and dry, no rash or jaundice.   Psych: Alert and cooperative, normal mood and affect.  Labs: Lab Results  Component Value Date   WBC 9.6 04/25/2012   HGB 15.7* 04/25/2012   HCT 46.4* 04/25/2012   MCV 88.4 04/25/2012   PLT 267 04/25/2012   Lab Results  Component Value Date   CREATININE 0.89 04/25/2012   BUN 15 04/25/2012   NA 136 04/25/2012   K 4.4 04/25/2012   CL 101 04/25/2012   CO2 24 04/25/2012   Lab Results  Component Value Date   ALT 18 04/25/2012   AST 21 04/25/2012   ALKPHOS 91 04/25/2012   BILITOT 0.3 04/25/2012   Lab Results  Component Value Date   LIPASE 21 04/25/2012     Imaging Studies: Ct Abdomen Pelvis W Contrast  04/25/2012  *RADIOLOGY REPORT*  Clinical Data: Lower abdominal pain and nausea.  30 pounds of weight loss over the past month.  CT ABDOMEN AND PELVIS WITH CONTRAST  Technique:  Multidetector CT imaging of the abdomen and pelvis was  performed following the standard protocol during bolus administration of intravenous contrast.  Contrast: 124m OMNIPAQUE IOHEXOL 300 MG/ML  SOLN  Comparison: CT of the abdomen and pelvis performed 11/18/2011  Findings: The visualized lung bases are clear.  A hypodensity noted at the periphery of the right hepatic lobe, nonspecific but likely reflecting a small cyst.  The liver and spleen are otherwise unremarkable in appearance.  The gallbladder is within normal limits.  The pancreas and adrenal glands are unremarkable.  A 0.5 cm cyst is noted at the medial aspect of the left kidney. The kidneys are otherwise grossly unremarkable appearance.  There is no evidence of hydronephrosis.  No renal or ureteral stones are seen.  No perinephric  stranding is appreciated.  No free fluid is identified.  The small bowel is unremarkable in appearance.  The stomach is within normal limits.  No acute vascular abnormalities are seen.  There is diffuse fat infiltration of the wall of the cecum and ascending colon, possibly reflecting chronic sequelae of inflammation; this is grossly stable from the prior study.  The appendix is normal in caliber and contains air, without evidence for appendicitis.  The remainder of the colon is unremarkable in appearance.  A few diverticula are noted at the cecum.  The bladder is mildly distended; urine in the bladder demonstrates mildly increased attenuation, of uncertain significance.  The patient is status post hysterectomy; no suspicious adnexal masses are seen.  No inguinal lymphadenopathy is seen.  No acute osseous abnormalities are identified.  A partially characterized metallic density at the labia appears to reflect a piercing.  IMPRESSION:  1.  No acute abnormalities seen within the abdomen or pelvis. 2.  Diffuse fat infiltration of the wall of the cecum and ascending colon, relatively stable from the prior study and likely reflecting chronic sequelae of inflammation.  No evidence of acute  bowel abnormality.  3.  Likely small hepatic and left renal cysts. 4.  Few diverticula noted at the cecum. 5.  Mildly increased attenuation of urine in the bladder, of uncertain significance.  Original Report Authenticated By: Santa Lighter, M.D.

## 2012-05-10 NOTE — Telephone Encounter (Signed)
I tried to call the patient to give her a different arrival time for her procedure.  She will need to arrive at 10:00am instead of 9:15am.  All the numbers listed were not working.

## 2012-05-10 NOTE — Progress Notes (Signed)
Faxed to PCP, Dr Laurance Flatten

## 2012-05-10 NOTE — Patient Instructions (Addendum)
Upper endoscopy and colonoscopy in the near future with Dr. Oneida Alar. Please see separate instructions.  Start Linzess 145 mcg orally daily 30 minutes before breakfast. Samples provided. Prescription was sent to your pharmacy. Take rebate card with you to the pharmacy.  Continue Nexium 40 mg daily.  Continue Phenergan as needed.

## 2012-05-10 NOTE — Assessment & Plan Note (Signed)
Numerous GI symptoms over the last few months. She has predominantly left-sided abdominal pain/mid abdominal pain associated with vomiting, weight loss. Denies diarrhea, in fact has significant constipation. She has typical GERD symptoms, solid food dysphagia, frequent vomiting and epigastric pain. Recent CT showed abnormality of the right colon. This would be an unusual presentation for inflammatory bowel disease. She will need to have a colonoscopy to further evaluate CT findings of the colon. Recommend upper endoscopy to further evaluate vomiting, epigastric pain, weight loss. EGD/ED/TCS in near future.  I have discussed the risks, alternatives, benefits with regards to but not limited to the risk of reaction to medication, bleeding, infection, perforation and the patient is agreeable to proceed. Written consent to be obtained.  Add Linzess 12mg daily for constipation. Continue Nexium and phenergan prn.

## 2012-05-15 ENCOUNTER — Encounter (HOSPITAL_COMMUNITY): Payer: Self-pay | Admitting: Pharmacy Technician

## 2012-05-19 ENCOUNTER — Other Ambulatory Visit: Payer: Self-pay | Admitting: Gastroenterology

## 2012-05-19 MED ORDER — PEG-KCL-NACL-NASULF-NA ASC-C 100 G PO SOLR: 1.0000 | Freq: Once | ORAL | Status: DC

## 2012-05-22 ENCOUNTER — Encounter (HOSPITAL_COMMUNITY): Payer: Self-pay | Admitting: *Deleted

## 2012-05-22 ENCOUNTER — Ambulatory Visit (HOSPITAL_COMMUNITY)
Admission: RE | Admit: 2012-05-22 | Discharge: 2012-05-22 | Disposition: A | Discharge status: Home / Self Care | Payer: BC Managed Care – PPO | Source: Ambulatory Visit | Attending: Gastroenterology | Admitting: Gastroenterology

## 2012-05-22 ENCOUNTER — Encounter (HOSPITAL_COMMUNITY)
Admission: RE | Disposition: A | Discharge status: Home / Self Care | Payer: Self-pay | Source: Ambulatory Visit | Attending: Gastroenterology

## 2012-05-22 DIAGNOSIS — D126 Benign neoplasm of colon, unspecified: Secondary | ICD-10-CM | POA: Insufficient documentation

## 2012-05-22 DIAGNOSIS — R1031 Right lower quadrant pain: Secondary | ICD-10-CM | POA: Insufficient documentation

## 2012-05-22 DIAGNOSIS — R109 Unspecified abdominal pain: Secondary | ICD-10-CM

## 2012-05-22 DIAGNOSIS — K5730 Diverticulosis of large intestine without perforation or abscess without bleeding: Secondary | ICD-10-CM

## 2012-05-22 DIAGNOSIS — K222 Esophageal obstruction: Secondary | ICD-10-CM

## 2012-05-22 DIAGNOSIS — K621 Rectal polyp: Secondary | ICD-10-CM

## 2012-05-22 DIAGNOSIS — K620 Anal polyp: Secondary | ICD-10-CM

## 2012-05-22 DIAGNOSIS — D128 Benign neoplasm of rectum: Secondary | ICD-10-CM | POA: Insufficient documentation

## 2012-05-22 DIAGNOSIS — K2970 Gastritis, unspecified, without bleeding: Secondary | ICD-10-CM

## 2012-05-22 DIAGNOSIS — K648 Other hemorrhoids: Secondary | ICD-10-CM | POA: Insufficient documentation

## 2012-05-22 DIAGNOSIS — K2990 Gastroduodenitis, unspecified, without bleeding: Secondary | ICD-10-CM

## 2012-05-22 DIAGNOSIS — R1310 Dysphagia, unspecified: Secondary | ICD-10-CM | POA: Insufficient documentation

## 2012-05-22 DIAGNOSIS — R1110 Vomiting, unspecified: Secondary | ICD-10-CM

## 2012-05-22 DIAGNOSIS — D129 Benign neoplasm of anus and anal canal: Secondary | ICD-10-CM | POA: Insufficient documentation

## 2012-05-22 DIAGNOSIS — R634 Abnormal weight loss: Secondary | ICD-10-CM | POA: Insufficient documentation

## 2012-05-22 DIAGNOSIS — K219 Gastro-esophageal reflux disease without esophagitis: Secondary | ICD-10-CM

## 2012-05-22 DIAGNOSIS — K2940 Chronic atrophic gastritis without bleeding: Secondary | ICD-10-CM | POA: Insufficient documentation

## 2012-05-22 HISTORY — DX: Malignant hyperthermia due to anesthesia, initial encounter: T88.3XXA

## 2012-05-22 HISTORY — DX: Family history of other specified conditions: Z84.89

## 2012-05-22 HISTORY — PX: ESOPHAGOGASTRODUODENOSCOPY: SHX1529

## 2012-05-22 HISTORY — PX: COLONOSCOPY: SHX174

## 2012-05-22 SURGERY — COLONOSCOPY, ESOPHAGOGASTRODUODENOSCOPY (EGD) AND ESOPHAGEAL DILATION (ED)
Anesthesia: Moderate Sedation

## 2012-05-22 MED ORDER — PROMETHAZINE HCL 25 MG/ML IJ SOLN
INTRAMUSCULAR | Status: AC
  Filled 2012-05-22: qty 1

## 2012-05-22 MED ORDER — MIDAZOLAM HCL 5 MG/5ML IJ SOLN
INTRAMUSCULAR | Status: AC
  Filled 2012-05-22: qty 10

## 2012-05-22 MED ORDER — MINERAL OIL PO OIL
TOPICAL_OIL | ORAL | Status: AC
  Filled 2012-05-22: qty 30

## 2012-05-22 MED ORDER — MEPERIDINE HCL 100 MG/ML IJ SOLN
INTRAMUSCULAR | Status: DC | PRN
  Administered 2012-05-22 (×2): 25 mg via INTRAVENOUS
  Administered 2012-05-22: 50 mg via INTRAVENOUS
  Administered 2012-05-22: 25 mg via INTRAVENOUS

## 2012-05-22 MED ORDER — MIDAZOLAM HCL 5 MG/5ML IJ SOLN
INTRAMUSCULAR | Status: DC | PRN
  Administered 2012-05-22 (×2): 2 mg via INTRAVENOUS
  Administered 2012-05-22 (×3): 1 mg via INTRAVENOUS

## 2012-05-22 MED ORDER — BUTAMBEN-TETRACAINE-BENZOCAINE 2-2-14 % EX AERO
INHALATION_SPRAY | CUTANEOUS | Status: DC | PRN
  Administered 2012-05-22: 2 via TOPICAL

## 2012-05-22 MED ORDER — STERILE WATER FOR IRRIGATION IR SOLN
Status: DC | PRN
  Administered 2012-05-22: 11:00:00

## 2012-05-22 MED ORDER — SODIUM CHLORIDE 0.9 % IJ SOLN
INTRAMUSCULAR | Status: AC
  Filled 2012-05-22: qty 10

## 2012-05-22 MED ORDER — ESOMEPRAZOLE MAGNESIUM 40 MG PO CPDR: DELAYED_RELEASE_CAPSULE | ORAL | Status: DC

## 2012-05-22 MED ORDER — SODIUM CHLORIDE 0.45 % IV SOLN
Freq: Once | INTRAVENOUS | Status: AC
  Administered 2012-05-22: 10:00:00 via INTRAVENOUS

## 2012-05-22 MED ORDER — PROMETHAZINE HCL 25 MG/ML IJ SOLN
INTRAMUSCULAR | Status: DC | PRN
  Administered 2012-05-22: 12.5 mg via INTRAVENOUS

## 2012-05-22 MED ORDER — MEPERIDINE HCL 100 MG/ML IJ SOLN
INTRAMUSCULAR | Status: AC
  Filled 2012-05-22: qty 2

## 2012-05-22 NOTE — H&P (Addendum)
Primary Care Physician:  Redge Gainer, MD Primary Gastroenterologist:  Dr. Oneida Alar  Pre-Procedure History & Physical: HPI:  Kathryn Olson is a 51 y.o. female here for DYSPHAGIA/abdominal pain.   Past Medical History  Diagnosis Date  . Environmental allergies   . Anxiety   . Family history of anesthesia complication     MH during C Section  . Malignant hyperthemia     [name] /   Pt has been tested positive    Past Surgical History  Procedure Date  . Partial hysterectomy   . Tubal ligation   . Cesarean section     X2  . Bladder surgery     age 34  . Esophagogastroduodenoscopy     remote, had ulcers, [place], Dr. Truddie Coco    Prior to Admission medications   Medication Sig Start Date End Date Taking? Authorizing Provider  ALPRAZolam Duanne Moron) 0.5 MG tablet Take 0.5 mg by mouth 3 (three) times daily.   Yes Historical Provider, MD  chlorpheniramine-HYDROcodone (TUSSIONEX) 10-8 MG/5ML LQCR Take 5 mLs by mouth every 12 (twelve) hours as needed. For cough 01/25/12  Yes Richarda Blade, MD  esomeprazole (NEXIUM) 40 MG capsule Take 40 mg by mouth daily as needed. For heartburn   Yes Historical Provider, MD  ferrous sulfate 325 (65 FE) MG tablet Take 325 mg by mouth daily with breakfast.   Yes Historical Provider, MD  levocetirizine (XYZAL) 5 MG tablet Take 5 mg by mouth every morning.    Yes Historical Provider, MD  Linaclotide Rolan Lipa) 145 MCG CAPS Take 145 mcg by mouth daily before breakfast. 05/10/12  Yes Mahala Menghini, PA  promethazine (PHENERGAN) 25 MG tablet Take 25 mg by mouth every 6 (six) hours as needed. For nausea   Yes Teressa Lower, MD  EPINEPHrine (EPI-PEN) 0.3 mg/0.3 mL DEVI Inject 0.3 mg into the muscle as needed.     Historical Provider, MD    Allergies as of 05/10/2012 - Review Complete 05/10/2012  Allergen Reaction Noted  . Peanut-containing drug products Anaphylaxis and Other (See Comments) 11/17/2011  . Aspirin Hives 11/17/2011  . Codeine Itching  11/17/2011  . Penicillins Other (See Comments) 11/17/2011  . Sulfa antibiotics Itching 11/17/2011  . Rubbing alcohol (alcohol) Rash 11/17/2011    Family History  Problem Relation Age of Onset  . Colon cancer Maternal Grandfather     ?less than age 38s  . Heart attack Father   . Diabetes Mother   . Inflammatory bowel disease Neg Hx   . Liver disease Brother     does know details, but related to MVA?  Marland Kitchen Malignant hyperthermia Other     History   Social History  . Marital Status: Married    Spouse Name: N/A    Number of Children: 2  . Years of Education: N/A   Occupational History  . plant, manual labor, lifting    Social History Main Topics  . Smoking status: Never Smoker   . Smokeless tobacco: Not on file  . Alcohol Use: Yes     socially  . Drug Use: No  . Sexually Active: Yes    Birth Control/ Protection: Surgical   Other Topics Concern  . Not on file   Social History Narrative  . No narrative on file    Review of Systems: See HPI, otherwise negative ROS   Physical Exam: BP 114/69  Pulse 57  Temp 97.6 F (36.4 C) (Oral)  Resp 18  SpO2 97% General:   Alert,  pleasant and cooperative in NAD Head:  Normocephalic and atraumatic. Neck:  Supple;  Lungs:  Clear throughout to auscultation.    Heart:  Regular rate and rhythm. Abdomen:  Soft, MILD TTP IN EPIGASTRIUM, nondistended. Normal bowel sounds, without guarding, and without rebound.   Neurologic:  Alert and  oriented x4;  grossly normal neurologically.  Impression/Plan:     DYSPHAGIA/abd pain PLAN:  TCS/EGD/DIL TODAY

## 2012-05-22 NOTE — OR Nursing (Signed)
Dr. 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    Dr Mirna Mires stated that moderate sedation with Demerol and Versed is not a trigger for MH.   OK to Precede with procedure.

## 2012-05-22 NOTE — Op Note (Signed)
[facility] [address] [place], [place]  [address]  ENDOSCOPY PROCEDURE REPORT  PATIENT:  Kathryn Olson, Kathryn Olson  MR#:  [id number] BIRTHDATE:  09-24-1961, 51 yrs. old  GENDER:  female  ENDOSCOPIST:  Barney Drain, MD ASSISTANT: Referred by:  Redge Gainer, M.D.  PROCEDURE DATE:  05/22/2012 PROCEDURE:  EGD with biopsy, EGD with dilatation over guidewire ASA CLASS: INDICATIONS:  DYSPHAGIA, WEIGHT LOSS UNINTENTIONAL, ABDOMINAL PAIN   MEDICATIONS:   Demerol 50 mg IV, Versed 2 mg IV TOPICAL ANESTHETIC:  Cetacaine Spray  DESCRIPTION OF PROCEDURE:   After the risks benefits and alternatives of the procedure were thoroughly explained, informed consent was obtained.  The EG-[id number] ([id number]) endoscope was introduced through the mouth and advanced to the second portion of the duodenum.  The instrument was slowly withdrawn as the mucosa was carefully examined.  Prior to withdrawal of the scope, the guidwire was placed.  The esophagus was dilated successfully.  The patient was recovered in endoscopy and discharged home in satisfactory condition. <<PROCEDUREIMAGES>>  A stricture was found in the distal esophagus NARROWING THE LUMEN TO 12-13 MM.  Moderate gastritis was found & BIOPSIED VIA COLD FORCEPS.    NL DUODENUM. Dilation was then performed at the total esophagus  1) Dilator:  Savary over guidewire  Size(s):  12.8-16 MM Resistance:  MINIMAL TO MODERATE  Heme: Appearance:  COMPLICATIONS:  None  ENDOSCOPIC IMPRESSION: 1) Stricture in the distal esophagus 2) Moderate gastritis  RECOMMENDATIONS: AWAIT BIOPSY NEXIUM 30 MINUTES PRIOR TO FIRST MEAL LOW FAT/HIGH FIBER DIET DRINK WATER LOSE WEIGHT-BMI CURRENTLY > 30 OPV IN 3 MOS  REPEAT EXAM:  No  ______________________________ Barney Drain, MD  CC:  n. eSIGNED:   Muhamad Serano at 05/22/2012 12:20 PM  Rodman Pickle, [phone]

## 2012-05-22 NOTE — Op Note (Signed)
[facility] [address] [place], [place]  [address]  COLONOSCOPY PROCEDURE REPORT  PATIENT:  Kathryn Olson, Kathryn Olson  MR#:  [id number] BIRTHDATE:  09/12/1961, 51 yrs. old  GENDER:  female  ENDOSCOPIST:  Barney Drain, MD REF. BY:  Redge Gainer, M.D. ASSISTANT:  PROCEDURE DATE:  05/22/2012 PROCEDURE:  Colonoscopy with biopsy  INDICATIONS:  abd pain, constipation- pmhx: anxiety/psychosocial stressors  LINZESS HELPED CONSTIPATION  MEDICATIONS:   Demerol 75 mg IV, promethazine (Phenergan) 12.5 mg IV, Versed 5 mg IV  DESCRIPTION OF PROCEDURE:    Physical exam was performed. Informed consent was obtained from the patient after explaining the benefits, risks, and alternatives to procedure.  The patient was connected to monitor and placed in left lateral position. Continuous oxygen was provided by nasal cannula and IV medicine administered through an indwelling cannula.  After administration of sedation and rectal exam, the patient's rectum was intubated and the EC-[id number] ([id number]) colonoscope was advanced under direct visualization to the cecum.  The scope was removed slowly by carefully examining the color, texture, anatomy, and integrity mucosa on the way out.  The patient was recovered in endoscopy and discharged home in satisfactory condition. <<PROCEDUREIMAGES>>  FINDINGS:  There were TWO  2 MM SESSLE HYPERPLASTC APPEARING polyp identified and removed. in the sigmoid colon.  There wAS ONE 2 MM HYPERPLASTIC APPEARING Polyp identified and removed. in the rectum.  POLYPS REMOVED VIA COLD FORCEPS. Scattered diverticula were found throughout the colon.  SMALL Internal Hemorrhoids were found.  PREP QUALITY:    GOOD CECAL W/D TIME:      22 minutes  COMPLICATIONS:    None  ENDOSCOPIC IMPRESSION: 1) Polyps, multiple in the sigmoid colon 2) Polyp in the rectum 3) MODERATE DiverticulOSIS throughout the colon 4) Internal hemorrhoids  RECOMMENDATIONS: AWAIT BIOPSY DRINK  WATER HIGH FIBER DIET CONTINUE LINZESS OPV IN 3 MOS TCS IN 10 YEARS  REPEAT EXAM:  No  ______________________________ Barney Drain, MD  CC:  Redge Gainer, M.D.  n. eSIGNED:   Ernesto Lashway at 05/22/2012 12:27 PM  Rodman Pickle, [phone]

## 2012-05-24 NOTE — Progress Notes (Signed)
REVIEWED.  

## 2012-05-25 ENCOUNTER — Telehealth: Payer: Self-pay | Admitting: *Deleted

## 2012-05-25 NOTE — Telephone Encounter (Signed)
Kathryn Olson would like to speak with someone regarding her colonoscopy. Please call her back.

## 2012-05-25 NOTE — Telephone Encounter (Signed)
LMOM to call.

## 2012-05-26 NOTE — Telephone Encounter (Signed)
Pt just wants results of path when it is available.

## 2012-05-27 ENCOUNTER — Emergency Department (HOSPITAL_COMMUNITY)
Admission: EM | Admit: 2012-05-27 | Discharge: 2012-05-28 | Disposition: A | Discharge status: Home / Self Care | Payer: BC Managed Care – PPO | Attending: Emergency Medicine | Admitting: Emergency Medicine

## 2012-05-27 ENCOUNTER — Emergency Department (HOSPITAL_COMMUNITY): Payer: BC Managed Care – PPO

## 2012-05-27 ENCOUNTER — Encounter (HOSPITAL_COMMUNITY): Payer: Self-pay

## 2012-05-27 DIAGNOSIS — K5730 Diverticulosis of large intestine without perforation or abscess without bleeding: Secondary | ICD-10-CM | POA: Insufficient documentation

## 2012-05-27 DIAGNOSIS — R109 Unspecified abdominal pain: Secondary | ICD-10-CM | POA: Insufficient documentation

## 2012-05-27 DIAGNOSIS — R112 Nausea with vomiting, unspecified: Secondary | ICD-10-CM

## 2012-05-27 DIAGNOSIS — Z9889 Other specified postprocedural states: Secondary | ICD-10-CM | POA: Insufficient documentation

## 2012-05-27 HISTORY — DX: Other specified postprocedural states: Z98.890

## 2012-05-27 HISTORY — DX: Adverse effect of unspecified anesthetic, initial encounter: T41.45XA

## 2012-05-27 HISTORY — DX: Other complications of anesthesia, initial encounter: T88.59XA

## 2012-05-27 LAB — COMPREHENSIVE METABOLIC PANEL
ALT: 16 U/L (ref 0–35)
Albumin: 4.1 g/dL (ref 3.5–5.2)
Alkaline Phosphatase: 82 U/L (ref 39–117)
BUN: 9 mg/dL (ref 6–23)
Chloride: 104 mEq/L (ref 96–112)
Glucose, Bld: 143 mg/dL — ABNORMAL HIGH (ref 70–99)
Potassium: 4.1 mEq/L (ref 3.5–5.1)
Total Bilirubin: 0.3 mg/dL (ref 0.3–1.2)

## 2012-05-27 LAB — CBC WITH DIFFERENTIAL/PLATELET
Basophils Relative: 0 % (ref 0–1)
Hemoglobin: 13.7 g/dL (ref 12.0–15.0)
Lymphs Abs: 1.1 10*3/uL (ref 0.7–4.0)
Monocytes Relative: 5 % (ref 3–12)
Neutro Abs: 6 10*3/uL (ref 1.7–7.7)
Neutrophils Relative %: 79 % — ABNORMAL HIGH (ref 43–77)
RBC: 4.77 MIL/uL (ref 3.87–5.11)

## 2012-05-27 LAB — LIPASE, BLOOD: Lipase: 16 U/L (ref 11–59)

## 2012-05-27 MED ORDER — HYDROMORPHONE HCL PF 1 MG/ML IJ SOLN
1.0000 mg | Freq: Once | INTRAMUSCULAR | Status: AC
  Administered 2012-05-27: 1 mg via INTRAVENOUS
  Filled 2012-05-27: qty 1

## 2012-05-27 MED ORDER — IOHEXOL 300 MG/ML  SOLN: 10.0000 mL | Freq: Once | INTRAMUSCULAR | Status: AC | PRN

## 2012-05-27 MED ORDER — ONDANSETRON HCL 4 MG/2ML IJ SOLN
4.0000 mg | Freq: Once | INTRAMUSCULAR | Status: AC
  Administered 2012-05-27: 4 mg via INTRAVENOUS
  Filled 2012-05-27: qty 2

## 2012-05-27 MED ORDER — IOHEXOL 300 MG/ML  SOLN
100.0000 mL | Freq: Once | INTRAMUSCULAR | Status: AC | PRN
  Administered 2012-05-27: 100 mL via INTRAVENOUS

## 2012-05-27 MED ORDER — SODIUM CHLORIDE 0.9 % IV SOLN: Freq: Once | INTRAVENOUS | Status: DC

## 2012-05-27 MED ORDER — SODIUM CHLORIDE 0.9 % IV BOLUS (SEPSIS)
1000.0000 mL | Freq: Once | INTRAVENOUS | Status: AC
  Administered 2012-05-27: 1000 mL via INTRAVENOUS

## 2012-05-27 NOTE — ED Notes (Signed)
Pt presents with generalized abdominal pain that has worsened over past week. Today has been persistent with pain and nausea. Pt reports having an EGD and colonoscopy on Monday with polyp removal. Pt reports not feeling good since procedure. Pt is tearful and anxious at this time. C/o N/v with last emesis after dinner tonight consisting of broccoli and chicken. Pt's abdomin is soft, rounded with positive bowel sounds.

## 2012-05-27 NOTE — ED Notes (Signed)
Abdominal pain and vomiting per pt. Had a EGD and Colonoscopy on Monday per pt. Haven't felt well since per pt.

## 2012-05-27 NOTE — ED Provider Notes (Signed)
History     CSN: [id number]  Arrival date & time 05/27/12  2124   First MD Initiated Contact with Patient 05/27/12 2138      Chief Complaint  Patient presents with  . Abdominal Pain  . Emesis    (Consider location/radiation/quality/duration/timing/severity/associated sxs/prior treatment) Patient is a 51 y.o. female presenting with abdominal pain and vomiting. The history is provided by the patient.  Abdominal Pain The primary symptoms of the illness include abdominal pain and vomiting.  Emesis  Associated symptoms include abdominal pain.  She had a colonoscopy and upper endoscopy 5 days ago. Last night, she started noticing abdominal distention. This morning at 1 AM, she started having abdominal pain and vomiting. She has been vomiting throughout the day. Pain is severe and located in the periumbilical area radiating around to the left side of the abdomen into the back. Pain is not improved after vomiting. She's also had a few watery bowel movements. There is no relief of pain with bowel movement. She's had chills but no fever or sweats. Pain is severe and she rates it 10/10. She has not taken anything for pain or nausea.  Past Medical History  Diagnosis Date  . Environmental allergies   . Anxiety   . Family history of anesthesia complication     MH during C Section  . 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 Complication of anesthesia   . H/O colonoscopy     Past Surgical History  Procedure Date  . Partial hysterectomy   . Tubal ligation   . Cesarean section     X2  . Bladder surgery     age 33  . Esophagogastroduodenoscopy     remote, had ulcers, [place], Dr. Truddie Coco    Family History  Problem Relation Age of Onset  . Colon cancer Maternal Grandfather     ?less than age 68s  . Heart attack Father   . Diabetes Mother   . Inflammatory bowel disease Neg Hx   . Liver disease Brother     does know details, but related to MVA?    Marland Kitchen Malignant hyperthermia Other     History  Substance Use Topics  . Smoking status: Never Smoker   . Smokeless tobacco: Not on file  . Alcohol Use: No     socially    OB History    Grav Para Term Preterm Abortions TAB SAB Ect Mult Living                  Review of Systems  Gastrointestinal: Positive for vomiting and abdominal pain.  All other systems reviewed and are negative.    Allergies  Aspirin; Peanut-containing drug products; Penicillins; Sulfa antibiotics; Codeine; and Rubbing alcohol  Home Medications   Current Outpatient Rx  Name Route Sig Dispense Refill  . ALPRAZOLAM 0.5 MG PO TABS Oral Take 0.5 mg by mouth 3 (three) times daily.    Marland Kitchen HYDROCOD POLST-CPM POLST ER 10-8 MG/5ML PO LQCR Oral Take 5 mLs by mouth every 12 (twelve) hours as needed. For cough    . ESOMEPRAZOLE MAGNESIUM 40 MG PO CPDR  1 po 30 minutes prior to your first meal FOREVER 31 capsule 11  . LEVOCETIRIZINE DIHYDROCHLORIDE 5 MG PO TABS Oral Take 5 mg by mouth every morning.     Marland Kitchen LINACLOTIDE 145 MCG PO CAPS Oral Take 145 mcg by mouth daily before breakfast.    . PROMETHAZINE HCL  25 MG PO TABS Oral Take 25 mg by mouth every 6 (six) hours as needed. For nausea    . EPINEPHRINE 0.3 MG/0.3ML IJ DEVI Intramuscular Inject 0.3 mg into the muscle as needed.       BP 124/79  Pulse 68  Temp 98.5 F (36.9 C) (Oral)  Resp 20  Ht 5' 2"  (1.575 m)  SpO2 99%  Physical Exam  Nursing note and vitals reviewed. 51year old female, who appears to be in pain. Vital signs are normal. Oxygen saturation is 99%, which is normal. Head is normocephalic and atraumatic. PERRLA, EOMI. Oropharynx is clear. Neck is nontender and supple without adenopathy or JVD. Back is nontender and there is no CVA tenderness. Lungs are clear without rales, wheezes, or rhonchi. Chest is nontender. Heart has regular rate and rhythm without murmur. Abdomen is slightly distended with tenderness in the periumbilical area and left upper  and left lower quadrants, no rebound or guarding, no masses or hepatosplenomegaly and peristalsis is hypoactive. Extremities have no cyanosis or edema, full range of motion is present. Skin is warm and dry without rash. Neurologic: Mental status is normal, cranial nerves are intact, there are no motor or sensory deficits.   ED Course  Procedures (including critical care time)  Results for orders placed during the hospital encounter of 05/27/12  CBC WITH DIFFERENTIAL      Component Value Range   WBC 7.6  4.0 - 10.5 K/uL   RBC 4.77  3.87 - 5.11 MIL/uL   Hemoglobin 13.7  12.0 - 15.0 g/dL   HCT 41.8  36.0 - 46.0 %   MCV 87.6  78.0 - 100.0 fL   MCH 28.7  26.0 - 34.0 pg   MCHC 32.8  30.0 - 36.0 g/dL   RDW 13.2  11.5 - 15.5 %   Platelets 220  150 - 400 K/uL   Neutrophils Relative 79 (*) 43 - 77 %   Neutro Abs 6.0  1.7 - 7.7 K/uL   Lymphocytes Relative 15  12 - 46 %   Lymphs Abs 1.1  0.7 - 4.0 K/uL   Monocytes Relative 5  3 - 12 %   Monocytes Absolute 0.4  0.1 - 1.0 K/uL   Eosinophils Relative 1  0 - 5 %   Eosinophils Absolute 0.1  0.0 - 0.7 K/uL   Basophils Relative 0  0 - 1 %   Basophils Absolute 0.0  0.0 - 0.1 K/uL  COMPREHENSIVE METABOLIC PANEL      Component Value Range   Sodium 142  135 - 145 mEq/L   Potassium 4.1  3.5 - 5.1 mEq/L   Chloride 104  96 - 112 mEq/L   CO2 29  19 - 32 mEq/L   Glucose, Bld 143 (*) 70 - 99 mg/dL   BUN 9  6 - 23 mg/dL   Creatinine, Ser 0.86  0.50 - 1.10 mg/dL   Calcium 10.3  8.4 - 10.5 mg/dL   Total Protein 7.5  6.0 - 8.3 g/dL   Albumin 4.1  3.5 - 5.2 g/dL   AST 15  0 - 37 U/L   ALT 16  0 - 35 U/L   Alkaline Phosphatase 82  39 - 117 U/L   Total Bilirubin 0.3  0.3 - 1.2 mg/dL   GFR calc non Af Amer 76 (*) >90 mL/min   GFR calc Af Amer 88 (*) >90 mL/min  LIPASE, BLOOD      Component Value Range   Lipase 16  11 - 59 U/L   Ct Abdomen Pelvis W Contrast  05/28/2012  *RADIOLOGY REPORT*  Clinical Data: Abdominal pain, recent EGD and colonoscopy.   CT ABDOMEN AND PELVIS WITH CONTRAST  Technique:  Multidetector CT imaging of the abdomen and pelvis was performed following the standard protocol during bolus administration of intravenous contrast.  Contrast: 170m OMNIPAQUE IOHEXOL 300 MG/ML  SOLN  Comparison: 04/25/2012  Findings: Limited images through the lung bases demonstrate no significant appreciable abnormality. The heart size is within normal limits. No pleural or pericardial effusion.  Low attenuation of the liver is nonspecific post contrast however suggests fatty infiltration.  Subcentimeter hypodensity within the right hepatic lobe is likely biliary cyst or hamartoma. Unremarkable biliary system, spleen, pancreas, adrenal glands. Subcentimeter hypodensity within the left kidney is too small to further characterize.  Otherwise, symmetric renal enhancement.  No hydronephrosis or hydroureter.  No bowel obstruction.  Colonic diverticulosis.  No CT evidence for diverticulitis.  Normal appendix.  No free intraperitoneal air or fluid.  No lymphadenopathy.  There is scattered atherosclerotic calcification of the aorta and its branches. No aneurysmal dilatation.  Partially decompressed bladder limits evaluation.  Unchanged appearance to the vagina. Absent uterus.  No adnexal mass.  No free fluid within the pelvis.  No inguinal lymphadenopathy.  No acute osseous finding  IMPRESSION: No acute intra-abdominal process identified.  Colonic diverticulosis without CT evidence for diverticulitis.  Suggestion of hepatic steatosis.  Original Report Authenticated By: ASuanne Marker M.D.      1. Abdominal pain   2. Nausea and vomiting       MDM  Abdominal pain and vomiting of uncertain cause. At 5 days post colonoscopy and EGD, I feel it is unlikely to be a complication of his procedures. However, a CT scan will be obtained to be sure. She'll be given IV fluids, IV narcotics, and IV antiemetics. Prior records are reviewed and the colonoscopy was  significant for scattered diverticulosis, several small polyps, and internal hemorrhoids. No note is available regarding the EGD.  CT is unremarkable as is laboratory workup. She was given hydromorphone, ondansetron, and IV fluids but states that she is not feeling any better. She will be given additional hydromorphone and will be given a trial of metoclopramide. Case is endorsed to Dr. KWilson Singerto evaluate patient after these medications.      DDelora Fuel MD [id number]

## 2012-05-28 MED ORDER — DIPHENHYDRAMINE HCL 50 MG/ML IJ SOLN
25.0000 mg | Freq: Once | INTRAMUSCULAR | Status: AC
  Administered 2012-05-28: 01:00:00 via INTRAVENOUS
  Filled 2012-05-28: qty 1

## 2012-05-28 MED ORDER — HYDROMORPHONE HCL PF 1 MG/ML IJ SOLN
1.0000 mg | Freq: Once | INTRAMUSCULAR | Status: AC
  Administered 2012-05-28: 1 mg via INTRAVENOUS
  Filled 2012-05-28: qty 1

## 2012-05-28 MED ORDER — METOCLOPRAMIDE HCL 5 MG/ML IJ SOLN
10.0000 mg | Freq: Once | INTRAMUSCULAR | Status: AC
  Administered 2012-05-28: 10 mg via INTRAVENOUS
  Filled 2012-05-28: qty 2

## 2012-05-29 LAB — CBC
ALT: 16 U/L (ref 7–35)
AST: 17 U/L
Albumin: 4.2
BUN: 10 mg/dL (ref 4–21)
Chloride: 103 mmol/L
Creat: 0.7
Glucose: 107 mg/dL
Potassium: 4.2 mmol/L
Total Bilirubin: 0.3 mg/dL
WBC: 6.1
platelet count: 249

## 2012-05-31 ENCOUNTER — Telehealth: Payer: Self-pay | Admitting: Gastroenterology

## 2012-05-31 NOTE — Telephone Encounter (Signed)
Please call pt. She had HYPERPLASTIC POLYPS removed from her colon. HER stomach Bx shows mild gastritis.    Stop taking iron.  CONTINUE NEXIUM. TAKE 30 MINUTES PRIOR TO YOUR FIRST MEAL FOREVER.   AVOID ASPIRIN, IBUPROFEN, MOTRIN, ALEVE, BC/GOODY POWDERS UNTIL 8/19. USE TYLENOL FOR PAIN.  FOLLOW A HIGH FIBER/LOW FAT DIET. AVOID ITEMS THAT CAUSE BLOATING.   LOSE 20 LBS. YOU BODY MASS INDEX IS OVER 30, WHICH MEANS YOU ARE OBESE. OBESITY IS ASSOCIATED WITH AN INCREASE RISK OF COLON CANCER.  FOLLOW UP IN 3 MOS.  Next colonoscopy in 10 years.

## 2012-06-01 NOTE — Telephone Encounter (Signed)
Recall made

## 2012-06-01 NOTE — Telephone Encounter (Signed)
Pt aware of results. She has been in the hospital and just came home on Tuesday.

## 2012-06-01 NOTE — Telephone Encounter (Signed)
LMOM to call back

## 2012-08-04 ENCOUNTER — Other Ambulatory Visit: Payer: Self-pay | Admitting: Family Medicine

## 2012-08-04 DIAGNOSIS — Z1231 Encounter for screening mammogram for malignant neoplasm of breast: Secondary | ICD-10-CM

## 2012-08-21 ENCOUNTER — Encounter: Payer: Self-pay | Admitting: Gastroenterology

## 2012-08-22 ENCOUNTER — Ambulatory Visit: Payer: BC Managed Care – PPO | Admitting: Gastroenterology

## 2012-08-22 ENCOUNTER — Encounter: Payer: Self-pay | Admitting: *Deleted

## 2012-08-22 ENCOUNTER — Encounter: Payer: Self-pay | Admitting: Urgent Care

## 2012-08-22 ENCOUNTER — Ambulatory Visit (INDEPENDENT_AMBULATORY_CARE_PROVIDER_SITE_OTHER): Payer: BC Managed Care – PPO | Admitting: Urgent Care

## 2012-08-22 VITALS — BP 164/98 | HR 72 | Temp 97.5°F | Ht 63.0 in | Wt 162.2 lb

## 2012-08-22 DIAGNOSIS — R109 Unspecified abdominal pain: Secondary | ICD-10-CM

## 2012-08-22 DIAGNOSIS — R51 Headache: Secondary | ICD-10-CM

## 2012-08-22 DIAGNOSIS — K219 Gastro-esophageal reflux disease without esophagitis: Secondary | ICD-10-CM

## 2012-08-22 DIAGNOSIS — R1110 Vomiting, unspecified: Secondary | ICD-10-CM

## 2012-08-22 DIAGNOSIS — R634 Abnormal weight loss: Secondary | ICD-10-CM

## 2012-08-22 DIAGNOSIS — K5900 Constipation, unspecified: Secondary | ICD-10-CM

## 2012-08-22 MED ORDER — ONDANSETRON HCL 4 MG PO TABS: 4.0000 mg | ORAL_TABLET | Freq: Four times a day (QID) | ORAL | Status: DC | PRN

## 2012-08-22 NOTE — Assessment & Plan Note (Addendum)
Pt has documented unintentional weight loss, chronic N/V, abdominal pain unexplained.  It is associated with headaches, dizziness, & memory changes. Suspect underlying IBS-C & possibly gastroparesis since colonoscopy/EGD benign.  We should r/o central etiology, thyroid, diabetes & celiac disease.  TTG IgA, TSH, fasting cortisol, hgb a1c PT ADVISED TO FU W/ PCP FOR CHRONIC HEADACHES & DIZZINESS Head CT

## 2012-08-22 NOTE — Progress Notes (Signed)
Faxed to PCP

## 2012-08-22 NOTE — Assessment & Plan Note (Signed)
Change to Linzess 230mg daily for constipation

## 2012-08-22 NOTE — Assessment & Plan Note (Addendum)
See weight loss Zofran 58m every 6 hrs as needed for nausea & vomiting

## 2012-08-22 NOTE — Patient Instructions (Addendum)
We will call with results of your head CT Nothing to eat or drink after midnight, get your labs at 8 am tomorrow Change to Linzess 240mg daily for constipation Zofran 472mevery 6 hrs as needed for nausea & vomiting Continue Nexium 4027maily for acid reflux BE SURE TO CALL YOUR DOCTOR FOR APPT FOR YOUR CHRONIC HEADACHES & DIZZINESS

## 2012-08-22 NOTE — Assessment & Plan Note (Signed)
Well controlled on PPI.  Gastritis on EGD.  Continue Nexium 61m daily.

## 2012-08-22 NOTE — Progress Notes (Signed)
Primary Care Physician:  Yehuda Savannah, MD-[place] Primary Gastroenterologist:  Dr. Barney Drain  Chief Complaint  Patient presents with  . Follow-up    not doing good  . Dysphagia    HPI:  Kathryn Olson is a 51 y.o. female here for follow up for chronic nausea, vomiting & constipation.  She had an EGD & colonoscopy that showed non h pylori gastritis & hyperplastic polyps.  She tells me she has been "in & out of the hospital" since EGD & colonoscopy.  She tells me she was at work & had to be rushed to [facility] ER as she couldn't keep anything down.  It started with clear liquids & then throwing up emesis that looks like her bowels.  CT A/P w/ oral & IV contrast was benign.  Hepatic & renal cysts noted.  C/o constant pain LLQ, left flank & lower back.  C/o anorexia.  She says she vomits every time she drinks water.  "Can't eat."  C/o constipation.  Miralax prn not helping.  Linzess 123mg daily no help.  Stool softeners not helping. BM q 2weeks.  Denies rectal bleeding or melena.  Ate a whole box of prunes to try & help.  Weight down 13# in past 4 months.  C/o headaches & feels like she can't remember things.  +dizziness.  No recent TSH.  Past Medical History  Diagnosis Date  . Environmental allergies   . Anxiety   . Family history of anesthesia complication     MH during C Section  . 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 Complication of anesthesia   . H/O colonoscopy     Past Surgical History  Procedure Date  . Partial hysterectomy   . Tubal ligation   . Cesarean section     X2  . Bladder surgery     age 51 . Esophagogastroduodenoscopy     remote, had ulcers, [place], Dr. RTruddie Coco . Colonoscopy 05/22/2012    SLF: Polyps, multiple hyperplastic in the sigmoid colon/Polyp in the rectum/  MODERATE Diverticulosis throughout the colon/ Internal hemorrhoids  . Esophagogastroduodenoscopy 05/22/2012    SLF: Stricture in the distal  esophagus/Moderate gastritis    Current Outpatient Prescriptions  Medication Sig Dispense Refill  . busPIRone (BUSPAR) 15 MG tablet Take 15 mg by mouth 3 (three) times daily.       .Marland Kitchendocusate sodium (COLACE) 100 MG capsule Take 100 mg by mouth 2 (two) times daily.      .Marland KitchenEPINEPHrine (EPI-PEN) 0.3 mg/0.3 mL DEVI Inject 0.3 mg into the muscle as needed.       .Marland Kitchenesomeprazole (NEXIUM) 40 MG capsule 1 po 30 minutes prior to your first meal FOREVER  31 capsule  11  . Linaclotide (LINZESS) 145 MCG CAPS Take 145 mcg by mouth daily before breakfast.      . polyethylene glycol (MIRALAX / GLYCOLAX) packet Take 17 g by mouth daily.      . promethazine (PHENERGAN) 25 MG tablet Take 25 mg by mouth every 6 (six) hours as needed. For nausea      . ondansetron (ZOFRAN) 4 MG tablet Take 1 tablet (4 mg total) by mouth every 6 (six) hours as needed for nausea.  30 tablet  1    Allergies as of 08/22/2012 - Review Complete 08/22/2012  Allergen Reaction Noted  . Aspirin Anaphylaxis 11/17/2011  . Peanut-containing drug products Anaphylaxis 11/17/2011  . Penicillins Anaphylaxis 11/17/2011  .  Sulfa antibiotics Anaphylaxis 11/17/2011  . Codeine Itching 11/17/2011  . Rubbing alcohol (alcohol) Rash 11/17/2011    Review of Systems: See HPI, otherwise negative  Physical Exam: BP 164/98  Pulse 72  Temp 97.5 F (36.4 C) (Temporal)  Ht 5' 3"  (1.6 m)  Wt 162 lb 3.2 oz (73.573 kg)  BMI 28.73 kg/m2 General:   Alert,  Well-developed, well-nourished, pleasant and cooperative in NAD Eyes:  Sclera clear, no icterus.   Conjunctiva pink. Mouth:  No deformity or lesions, oropharynx pink and moist. Neck:  Supple; no masses or thyromegaly. Heart:  Regular rate and rhythm; no murmurs, clicks, rubs,  or gallops. Abdomen:  Normal bowel sounds.  No bruits.  Soft, non-tender and non-distended without masses, hepatosplenomegaly or hernias noted.  No guarding or rebound tenderness.   Rectal:  Deferred. Msk:  Symmetrical  without gross deformities.  Pulses:  Normal pulses noted. Extremities:  No clubbing or edema. Neurologic:  Alert and oriented x4;  grossly normal neurologically. Skin:  Intact without significant lesions or rashes.

## 2012-08-23 ENCOUNTER — Ambulatory Visit (HOSPITAL_COMMUNITY)
Admission: RE | Admit: 2012-08-23 | Discharge: 2012-08-23 | Disposition: A | Discharge status: Home / Self Care | Payer: BC Managed Care – PPO | Source: Ambulatory Visit | Attending: Urgent Care | Admitting: Urgent Care

## 2012-08-23 DIAGNOSIS — R51 Headache: Secondary | ICD-10-CM | POA: Insufficient documentation

## 2012-08-23 DIAGNOSIS — R634 Abnormal weight loss: Secondary | ICD-10-CM | POA: Insufficient documentation

## 2012-08-23 DIAGNOSIS — R1110 Vomiting, unspecified: Secondary | ICD-10-CM | POA: Insufficient documentation

## 2012-08-23 LAB — CORTISOL: Cortisol, Plasma: 13.1 ug/dL

## 2012-08-23 LAB — HEMOGLOBIN A1C: Hgb A1c MFr Bld: 6.2 % — ABNORMAL HIGH (ref <5.7)

## 2012-08-23 NOTE — Progress Notes (Signed)
Quick Note:  LMOM for return call. I need to go over results w/ pt & set up brain MRI. Thanks EM:[id number], Elenore Rota, MD  ______

## 2012-08-25 NOTE — Progress Notes (Signed)
Quick Note:  Attempted to call pt & husband's numbers. Number not available. Please send pt letter to call our office ASAP. Need to set up MRI brain. Labs are ok. Thanks IL:[id number], Elenore Rota, MD  ______

## 2012-08-28 NOTE — Progress Notes (Signed)
Quick Note:  Letter was mailed by Ginger on 08/25/2012. ______

## 2012-08-30 ENCOUNTER — Telehealth: Payer: Self-pay | Admitting: Urgent Care

## 2012-08-30 MED ORDER — LINACLOTIDE 145 MCG PO CAPS: 145.0000 ug | ORAL_CAPSULE | Freq: Every day | ORAL | Status: DC

## 2012-08-30 NOTE — Progress Notes (Signed)
Quick Note:  Pt received letter and called. I informed labs OK, and she needed an MRI of the brain. She does not have a contact phone number at this time so I asked her to call Darius Bump back this afternoon and she said she would. ______

## 2012-08-30 NOTE — Progress Notes (Signed)
Quick Note:  Attempted to call pt. "Person you are trying to reach is not accepting calls" msg when attempting to call pt today. Letter has been sent for follow-up.  [place]:POLID, Elenore Rota, MD    ______

## 2012-08-30 NOTE — Progress Notes (Signed)
Quick Note:  Pt called and is aware she needs MRI. She will call back to speak to Darius Bump this afternoon. ______

## 2012-08-30 NOTE — Progress Notes (Signed)
Faxed to PCP

## 2012-08-30 NOTE — Telephone Encounter (Signed)
RX printed for Kathryn Olson

## 2012-08-31 ENCOUNTER — Encounter: Payer: Self-pay | Admitting: Gastroenterology

## 2012-08-31 NOTE — Progress Notes (Signed)
CT ? LESION IN LEFT PONS. MRI PENDING.  REVIEWED.

## 2012-09-04 ENCOUNTER — Other Ambulatory Visit: Payer: Self-pay | Admitting: Urgent Care

## 2012-09-04 DIAGNOSIS — R930 Abnormal findings on diagnostic imaging of skull and head, not elsewhere classified: Secondary | ICD-10-CM

## 2012-09-04 DIAGNOSIS — R112 Nausea with vomiting, unspecified: Secondary | ICD-10-CM

## 2012-09-04 NOTE — Progress Notes (Signed)
Quick Note:  Discussed w/ Dr Annamaria Boots (radiologist) regarding MRI. Needs MRI brain with & without contrast YB:NLWH head CT, chronic N/V Please arrange Thanks KN:ZUDOD, DONALD, MD  ______

## 2012-09-08 ENCOUNTER — Telehealth: Payer: Self-pay | Admitting: Urgent Care

## 2012-09-08 NOTE — Telephone Encounter (Signed)
MRI Has been ordered and Ive tried numerous times to contact the patient and she wont answer or call us back

## 2012-09-11 ENCOUNTER — Telehealth: Payer: Self-pay | Admitting: Urgent Care

## 2012-09-11 NOTE — Telephone Encounter (Signed)
No Ive tried n tried to call her, the order is in I just cant get in touch with her to schedule it

## 2012-09-11 NOTE — Telephone Encounter (Signed)
Kathryn Olson,  What is the status of this MRI head?  Did we ever get in touch w/ pt?

## 2012-09-15 ENCOUNTER — Ambulatory Visit: Payer: BC Managed Care – PPO

## 2012-09-18 NOTE — Telephone Encounter (Signed)
Ok thanks 

## 2012-10-09 NOTE — Progress Notes (Signed)
Quick Note:  Pt NONCOMPLIANT. We have made multiple attempts to schedule appt for MRI.  ______

## 2012-10-15 ENCOUNTER — Encounter (HOSPITAL_COMMUNITY): Payer: Self-pay | Admitting: *Deleted

## 2012-10-15 ENCOUNTER — Emergency Department (HOSPITAL_COMMUNITY)
Admission: EM | Admit: 2012-10-15 | Discharge: 2012-10-16 | Disposition: A | Discharge status: Home / Self Care | Payer: BC Managed Care – PPO | Attending: Emergency Medicine | Admitting: Emergency Medicine

## 2012-10-15 DIAGNOSIS — R509 Fever, unspecified: Secondary | ICD-10-CM | POA: Insufficient documentation

## 2012-10-15 DIAGNOSIS — J309 Allergic rhinitis, unspecified: Secondary | ICD-10-CM | POA: Insufficient documentation

## 2012-10-15 DIAGNOSIS — Z8659 Personal history of other mental and behavioral disorders: Secondary | ICD-10-CM | POA: Insufficient documentation

## 2012-10-15 DIAGNOSIS — Z79899 Other long term (current) drug therapy: Secondary | ICD-10-CM | POA: Insufficient documentation

## 2012-10-15 DIAGNOSIS — M2550 Pain in unspecified joint: Secondary | ICD-10-CM | POA: Insufficient documentation

## 2012-10-15 DIAGNOSIS — R52 Pain, unspecified: Secondary | ICD-10-CM | POA: Insufficient documentation

## 2012-10-15 DIAGNOSIS — B349 Viral infection, unspecified: Secondary | ICD-10-CM

## 2012-10-15 DIAGNOSIS — R05 Cough: Secondary | ICD-10-CM | POA: Insufficient documentation

## 2012-10-15 DIAGNOSIS — R059 Cough, unspecified: Secondary | ICD-10-CM | POA: Insufficient documentation

## 2012-10-15 DIAGNOSIS — J029 Acute pharyngitis, unspecified: Secondary | ICD-10-CM | POA: Insufficient documentation

## 2012-10-15 DIAGNOSIS — B9789 Other viral agents as the cause of diseases classified elsewhere: Secondary | ICD-10-CM | POA: Insufficient documentation

## 2012-10-15 HISTORY — DX: Migraine, unspecified, not intractable, without status migrainosus: G43.909

## 2012-10-15 MED ORDER — ACETAMINOPHEN 500 MG PO TABS
1000.0000 mg | ORAL_TABLET | Freq: Once | ORAL | Status: AC
  Administered 2012-10-15: 1000 mg via ORAL
  Filled 2012-10-15: qty 2

## 2012-10-15 MED ORDER — MORPHINE SULFATE 2 MG/ML IJ SOLN
2.0000 mg | Freq: Once | INTRAMUSCULAR | Status: AC
  Administered 2012-10-15: 2 mg via INTRAVENOUS
  Filled 2012-10-15: qty 1

## 2012-10-15 MED ORDER — SODIUM CHLORIDE 0.9 % IV SOLN
Freq: Once | INTRAVENOUS | Status: AC
  Administered 2012-10-16: 01:00:00 via INTRAVENOUS

## 2012-10-15 MED ORDER — ONDANSETRON HCL 4 MG/2ML IJ SOLN
4.0000 mg | Freq: Once | INTRAMUSCULAR | Status: AC
  Administered 2012-10-15: 4 mg via INTRAVENOUS
  Filled 2012-10-15: qty 2

## 2012-10-15 MED ORDER — SODIUM CHLORIDE 0.9 % IV BOLUS (SEPSIS)
1000.0000 mL | Freq: Once | INTRAVENOUS | Status: AC
  Administered 2012-10-15: 1000 mL via INTRAVENOUS

## 2012-10-15 NOTE — ED Notes (Signed)
Pt c/o severe headache and states she has hx of migraine headaches.

## 2012-10-15 NOTE — ED Notes (Signed)
Pt c/o migraine headache since yesterday with nausea and chills.
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
## 2012-10-16 MED ORDER — ONDANSETRON HCL 4 MG PO TABS: 4.0000 mg | ORAL_TABLET | Freq: Four times a day (QID) | ORAL | Status: DC

## 2013-01-01 ENCOUNTER — Ambulatory Visit (INDEPENDENT_AMBULATORY_CARE_PROVIDER_SITE_OTHER): Payer: Self-pay | Admitting: Psychiatry

## 2013-01-01 ENCOUNTER — Encounter (HOSPITAL_COMMUNITY): Payer: Self-pay | Admitting: Psychiatry

## 2013-01-01 DIAGNOSIS — F411 Generalized anxiety disorder: Secondary | ICD-10-CM

## 2013-01-01 DIAGNOSIS — F339 Major depressive disorder, recurrent, unspecified: Secondary | ICD-10-CM
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
## 2013-01-11 ENCOUNTER — Ambulatory Visit (INDEPENDENT_AMBULATORY_CARE_PROVIDER_SITE_OTHER): Payer: 59 | Admitting: Psychiatry

## 2013-01-11 DIAGNOSIS — F411 Generalized anxiety disorder: Secondary | ICD-10-CM

## 2013-01-11 DIAGNOSIS — F339 Major depressive disorder, recurrent, unspecified: Secondary | ICD-10-CM

## 2013-01-16 NOTE — Patient Instructions (Addendum)
Discussed orally 
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
## 2013-01-19 ENCOUNTER — Ambulatory Visit (INDEPENDENT_AMBULATORY_CARE_PROVIDER_SITE_OTHER): Payer: 59 | Admitting: Psychiatry

## 2013-01-19 DIAGNOSIS — F411 Generalized anxiety disorder: Secondary | ICD-10-CM

## 2013-01-19 DIAGNOSIS — F339 Major depressive disorder, recurrent, unspecified: Secondary | ICD-10-CM
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
## 2013-01-19 NOTE — Patient Instructions (Signed)
Discussed orally 

## 2013-01-29 ENCOUNTER — Encounter (HOSPITAL_COMMUNITY): Payer: Self-pay | Admitting: Psychiatry

## 2013-01-29 ENCOUNTER — Ambulatory Visit (INDEPENDENT_AMBULATORY_CARE_PROVIDER_SITE_OTHER): Payer: 59 | Admitting: Psychiatry

## 2013-01-29 VITALS — Wt 157.2 lb

## 2013-01-29 DIAGNOSIS — M549 Dorsalgia, unspecified: Secondary | ICD-10-CM | POA: Insufficient documentation

## 2013-01-29 DIAGNOSIS — G8929 Other chronic pain: Secondary | ICD-10-CM

## 2013-01-29 DIAGNOSIS — G4701 Insomnia due to medical condition: Secondary | ICD-10-CM | POA: Insufficient documentation

## 2013-01-29 DIAGNOSIS — G4700 Insomnia, unspecified: Secondary | ICD-10-CM | POA: Insufficient documentation

## 2013-01-29 DIAGNOSIS — F429 Obsessive-compulsive disorder, unspecified: Secondary | ICD-10-CM

## 2013-01-29 DIAGNOSIS — G969 Disorder of central nervous system, unspecified: Secondary | ICD-10-CM

## 2013-01-29 DIAGNOSIS — F411 Generalized anxiety disorder: Secondary | ICD-10-CM

## 2013-01-29 DIAGNOSIS — F4310 Post-traumatic stress disorder, unspecified: Secondary | ICD-10-CM

## 2013-01-29 DIAGNOSIS — F341 Dysthymic disorder: Secondary | ICD-10-CM | POA: Insufficient documentation

## 2013-01-29 DIAGNOSIS — F5105 Insomnia due to other mental disorder: Secondary | ICD-10-CM

## 2013-01-29 DIAGNOSIS — F418 Other specified anxiety disorders: Secondary | ICD-10-CM | POA: Insufficient documentation

## 2013-01-29 MED ORDER — BUSPIRONE HCL 15 MG PO TABS: 15.0000 mg | ORAL_TABLET | Freq: Four times a day (QID) | ORAL | Status: DC

## 2013-01-29 MED ORDER — BACLOFEN 10 MG PO TABS: 10.0000 mg | ORAL_TABLET | Freq: Every day | ORAL | Status: DC

## 2013-01-29 MED ORDER — CARBAMAZEPINE ER 200 MG PO TB12: ORAL_TABLET | ORAL | Status: DC

## 2013-01-29 MED ORDER — CARBAMAZEPINE ER 300 MG PO CP12: 300.0000 mg | ORAL_CAPSULE | Freq: Every day | ORAL | Status: DC

## 2013-01-29 NOTE — Addendum Note (Signed)
Addended by: Darrol Jump on: 01/29/2013 02:38 PM   Modules accepted: Orders
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
## 2013-01-29 NOTE — Patient Instructions (Signed)
For what brain health, it advised that you get  regular exercise, regular sleep, and  consume good quality, fish oil, 1000 mg twice a day. These 3 things are the foundation of rehabilitating your brain. Staying off all abusable substances including nicotine, caffeine, and refined sugar and avoiding further head injuries are the other important elements in helping you keep your brain working the best it can for you. If memory is a problem then INSTEAD of the fish oil mentioned above, try using Brain Power Basics from [place].  You can order online or by phone [phone]. It costs $99 for the first month, and $80 monthly thereafter, but that investment in your brain and the recovery of your brain proper functioning would seem worth it.  Relaxation is the ultimate solution for you.  You can seek it through tub baths, bubble baths, essential oils or incense, walking or chatting with friends, listening to soft music, watching a candle burn and just letting all thoughts go and appreciating the true essence of the Creator.  Pets or animals may be very helpful.  You might spend some time with them and then go do more directed meditation.  "I am Wishes Fulfilled Meditation" by Darla Lesches and Huey Romans may be helpful MUSIC for getting to sleep or for meditating You can order it from on line.  You might find the Chill channel on Pandora and explore the artists that you like better.   Take care of yourself.  No one else is standing up to do the job and only you know what you need.   GET SERIOUS about taking care of yourself.  Do the next right thing and that often means doing something to care for yourself along the lines of are you hungry, are you angry, are you lonely, are you tired, are you scared?  HAL[employer] is what that stands for.  Call if problems or concerns.

## 2013-02-26 ENCOUNTER — Ambulatory Visit (HOSPITAL_COMMUNITY): Payer: Self-pay | Admitting: Psychiatry

## 2013-03-09 ENCOUNTER — Ambulatory Visit (HOSPITAL_COMMUNITY): Payer: Self-pay | Admitting: Psychiatry

## 2013-03-18 DIAGNOSIS — K589 Irritable bowel syndrome without diarrhea: Secondary | ICD-10-CM

## 2013-03-18 HISTORY — DX: Irritable bowel syndrome, unspecified: K58.9

## 2013-03-22 ENCOUNTER — Ambulatory Visit (HOSPITAL_COMMUNITY): Payer: Self-pay | Admitting: Psychiatry

## 2013-03-30 ENCOUNTER — Ambulatory Visit (INDEPENDENT_AMBULATORY_CARE_PROVIDER_SITE_OTHER): Payer: 59 | Admitting: Psychiatry

## 2013-03-30 ENCOUNTER — Encounter (HOSPITAL_COMMUNITY): Payer: Self-pay | Admitting: Psychiatry

## 2013-03-30 VITALS — BP 124/70 | Wt 159.6 lb

## 2013-03-30 DIAGNOSIS — F429 Obsessive-compulsive disorder, unspecified: Secondary | ICD-10-CM

## 2013-03-30 DIAGNOSIS — F418 Other specified anxiety disorders: Secondary | ICD-10-CM

## 2013-03-30 DIAGNOSIS — F411 Generalized anxiety disorder: Secondary | ICD-10-CM

## 2013-03-30 DIAGNOSIS — F4310 Post-traumatic stress disorder, unspecified: Secondary | ICD-10-CM

## 2013-03-30 DIAGNOSIS — F341 Dysthymic disorder: Secondary | ICD-10-CM

## 2013-03-30 DIAGNOSIS — M549 Dorsalgia, unspecified: Secondary | ICD-10-CM

## 2013-03-30 DIAGNOSIS — G969 Disorder of central nervous system, unspecified: Secondary | ICD-10-CM

## 2013-03-30 DIAGNOSIS — G8929 Other chronic pain: Secondary | ICD-10-CM

## 2013-03-30 NOTE — Patient Instructions (Signed)
Go to [facility] ED for medical clearance prior to admission to [facility] for medication adjustment.  Call if problems or concerns.

## 2013-04-02 ENCOUNTER — Ambulatory Visit (HOSPITAL_COMMUNITY): Payer: Self-pay | Admitting: Psychiatry

## 2013-04-03 ENCOUNTER — Emergency Department (HOSPITAL_COMMUNITY)
Admission: EM | Admit: 2013-04-03 | Discharge: 2013-04-04 | Disposition: A | Discharge status: Home / Self Care | Payer: Managed Care, Other (non HMO) | Attending: Emergency Medicine | Admitting: Emergency Medicine

## 2013-04-03 ENCOUNTER — Ambulatory Visit (HOSPITAL_COMMUNITY): Payer: 59 | Admitting: Psychiatry

## 2013-04-03 ENCOUNTER — Encounter (HOSPITAL_COMMUNITY): Payer: Self-pay

## 2013-04-03 DIAGNOSIS — F17200 Nicotine dependence, unspecified, uncomplicated: Secondary | ICD-10-CM | POA: Insufficient documentation

## 2013-04-03 DIAGNOSIS — Z87828 Personal history of other (healed) physical injury and trauma: Secondary | ICD-10-CM | POA: Insufficient documentation

## 2013-04-03 DIAGNOSIS — F429 Obsessive-compulsive disorder, unspecified: Secondary | ICD-10-CM | POA: Insufficient documentation

## 2013-04-03 DIAGNOSIS — R51 Headache: Secondary | ICD-10-CM | POA: Insufficient documentation

## 2013-04-03 DIAGNOSIS — Z79899 Other long term (current) drug therapy: Secondary | ICD-10-CM | POA: Insufficient documentation

## 2013-04-03 DIAGNOSIS — Z8719 Personal history of other diseases of the digestive system: Secondary | ICD-10-CM | POA: Insufficient documentation

## 2013-04-03 DIAGNOSIS — F329 Major depressive disorder, single episode, unspecified: Secondary | ICD-10-CM | POA: Insufficient documentation

## 2013-04-03 DIAGNOSIS — G8929 Other chronic pain: Secondary | ICD-10-CM | POA: Insufficient documentation

## 2013-04-03 DIAGNOSIS — F4310 Post-traumatic stress disorder, unspecified: Secondary | ICD-10-CM | POA: Insufficient documentation

## 2013-04-03 DIAGNOSIS — Z3202 Encounter for pregnancy test, result negative: Secondary | ICD-10-CM | POA: Insufficient documentation

## 2013-04-03 DIAGNOSIS — R209 Unspecified disturbances of skin sensation: Secondary | ICD-10-CM | POA: Insufficient documentation

## 2013-04-03 DIAGNOSIS — F411 Generalized anxiety disorder: Secondary | ICD-10-CM | POA: Insufficient documentation

## 2013-04-03 DIAGNOSIS — M542 Cervicalgia: Secondary | ICD-10-CM | POA: Insufficient documentation

## 2013-04-03 DIAGNOSIS — F3289 Other specified depressive episodes: Secondary | ICD-10-CM | POA: Insufficient documentation

## 2013-04-03 DIAGNOSIS — G479 Sleep disorder, unspecified: Secondary | ICD-10-CM | POA: Insufficient documentation

## 2013-04-03 DIAGNOSIS — Z880 Allergy status to penicillin: Secondary | ICD-10-CM | POA: Insufficient documentation

## 2013-04-03 LAB — ETHANOL: Alcohol, Ethyl (B): <11 mg/dL (ref 0–11)

## 2013-04-03 LAB — SALICYLATE LEVEL: Salicylate Lvl: <2 mg/dL — ABNORMAL LOW (ref 2.8–20.0)

## 2013-04-03 LAB — COMPREHENSIVE METABOLIC PANEL
BUN: 14 mg/dL (ref 6–23)
CO2: 27 mEq/L (ref 19–32)
Chloride: 103 mEq/L (ref 96–112)
Creatinine, Ser: 0.8 mg/dL (ref 0.50–1.10)
GFR calc non Af Amer: 83 mL/min — ABNORMAL LOW (ref >90)
Total Bilirubin: 0.3 mg/dL (ref 0.3–1.2)

## 2013-04-03 LAB — CBC
HCT: 44.2 % (ref 36.0–46.0)
MCV: 88.9 fL (ref 78.0–100.0)
RBC: 4.97 MIL/uL (ref 3.87–5.11)
WBC: 6.5 10*3/uL (ref 4.0–10.5)

## 2013-04-03 LAB — POCT PREGNANCY, URINE: Preg Test, Ur: NEGATIVE

## 2013-04-03 MED ORDER — KETOROLAC TROMETHAMINE 60 MG/2ML IM SOLN
60.0000 mg | Freq: Once | INTRAMUSCULAR | Status: AC
  Administered 2013-04-03: 60 mg via INTRAMUSCULAR
  Filled 2013-04-03: qty 2

## 2013-04-03 MED ORDER — METOCLOPRAMIDE HCL 10 MG PO TABS
10.0000 mg | ORAL_TABLET | Freq: Once | ORAL | Status: AC
  Administered 2013-04-03: 10 mg via ORAL
  Filled 2013-04-03: qty 1

## 2013-04-03 MED ORDER — DIPHENHYDRAMINE HCL 25 MG PO CAPS
25.0000 mg | ORAL_CAPSULE | Freq: Once | ORAL | Status: AC
  Administered 2013-04-03: 25 mg via ORAL
  Filled 2013-04-03: qty 1

## 2013-04-03 NOTE — ED Notes (Signed)
Pt states sent here from [facility] on Friday but decided to come today to have meds adjustment. Pt states she her therapist every week, pt denies SI/HI; states; per referral to come here for Medical Clearance prior to admission to [facility] for med adjustment. Pt c/o chronic headaches, denies SI/HI, denies AV/AH

## 2013-04-03 NOTE — ED Notes (Signed)
Pt transferred from main ed, presents with c/o depression, anxiety.. Pt needs medication adjustment.  Pt reports she can not afford medications.  Pt reports she last took Tegretol & Moss Mc, May 12th..  Pt reports meds make her vomit. Denies feeling hopeless.  Denies SI or HI, no AV hallucinations.  Pt calm & cooperative.  States her Psychiatrist just relocated to another area and she needs a Occupational psychologist and Primary MD.  Pt calm &cooperative.
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
## 2013-04-03 NOTE — ED Notes (Signed)
Telepsych papers faxed

## 2013-04-04 DIAGNOSIS — F429 Obsessive-compulsive disorder, unspecified: Secondary | ICD-10-CM

## 2013-04-04 DIAGNOSIS — F4310 Post-traumatic stress disorder, unspecified: Secondary | ICD-10-CM

## 2013-04-04 DIAGNOSIS — F411 Generalized anxiety disorder: Secondary | ICD-10-CM

## 2013-04-04 MED ORDER — ACETAMINOPHEN 325 MG PO TABS
650.0000 mg | ORAL_TABLET | Freq: Once | ORAL | Status: AC
  Administered 2013-04-04: 650 mg via ORAL
  Filled 2013-04-04: qty 2
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
## 2013-04-04 NOTE — ED Provider Notes (Signed)
2:38 AM d/w Frederico Hamman PSY PA - does not meet criteria for PSY admit - Frederico Hamman is willing and offered to write Rx for PSY meds, but PT unable to fill it due to financial reasons.  She has been referred to [facility] for medications.   No SI/ HI  Results for orders placed during the hospital encounter of 04/03/13  ACETAMINOPHEN LEVEL      Result Value Range   Acetaminophen (Tylenol), Serum <15.0  10 - 30 ug/mL  CBC      Result Value Range   WBC 6.5  4.0 - 10.5 K/uL   RBC 4.97  3.87 - 5.11 MIL/uL   Hemoglobin 14.5  12.0 - 15.0 g/dL   HCT 44.2  36.0 - 46.0 %   MCV 88.9  78.0 - 100.0 fL   MCH 29.2  26.0 - 34.0 pg   MCHC 32.8  30.0 - 36.0 g/dL   RDW 12.9  11.5 - 15.5 %   Platelets 283  150 - 400 K/uL  COMPREHENSIVE METABOLIC PANEL      Result Value Range   Sodium 139  135 - 145 mEq/L   Potassium 3.8  3.5 - 5.1 mEq/L   Chloride 103  96 - 112 mEq/L   CO2 27  19 - 32 mEq/L   Glucose, Bld 126 (*) 70 - 99 mg/dL   BUN 14  6 - 23 mg/dL   Creatinine, Ser 0.80  0.50 - 1.10 mg/dL   Calcium 9.8  8.4 - 10.5 mg/dL   Total Protein 7.5  6.0 - 8.3 g/dL   Albumin 3.9  3.5 - 5.2 g/dL   AST 14  0 - 37 U/L   ALT 17  0 - 35 U/L   Alkaline Phosphatase 95  39 - 117 U/L   Total Bilirubin 0.3  0.3 - 1.2 mg/dL   GFR calc non Af Amer 83 (*) >90 mL/min   GFR calc Af Amer >90  >90 mL/min  ETHANOL      Result Value Range   Alcohol, Ethyl (B) <11  0 - 11 mg/dL  SALICYLATE LEVEL      Result Value Range   Salicylate Lvl <9.7 (*) 2.8 - 20.0 mg/dL  URINE RAPID DRUG SCREEN (HOSP PERFORMED)      Result Value Range   Opiates NONE DETECTED  NONE DETECTED   Cocaine NONE DETECTED  NONE DETECTED   Benzodiazepines NONE DETECTED  NONE DETECTED   Amphetamines NONE DETECTED  NONE DETECTED   Tetrahydrocannabinol NONE DETECTED  NONE DETECTED   Barbiturates NONE DETECTED  NONE DETECTED  POCT PREGNANCY, URINE      Result Value Range   Preg Test, Ur NEGATIVE  NEGATIVE      Teressa Lower, MD 04/04/13 [phone]

## 2013-04-05 NOTE — ED Provider Notes (Signed)
  Medical screening examination/treatment/procedure(s) were performed by non-physician practitioner and as supervising physician I was immediately available for consultation/collaboration.    Carmin Muskrat, MD 04/05/13 1116
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
## 2013-04-28 ENCOUNTER — Encounter (HOSPITAL_COMMUNITY): Payer: Self-pay

## 2013-04-28 ENCOUNTER — Emergency Department (HOSPITAL_COMMUNITY)
Admission: EM | Admit: 2013-04-28 | Discharge: 2013-04-28 | Disposition: A | Discharge status: Home / Self Care | Payer: Managed Care, Other (non HMO) | Attending: Emergency Medicine | Admitting: Emergency Medicine

## 2013-04-28 DIAGNOSIS — F4310 Post-traumatic stress disorder, unspecified: Secondary | ICD-10-CM | POA: Insufficient documentation

## 2013-04-28 DIAGNOSIS — R064 Hyperventilation: Secondary | ICD-10-CM | POA: Insufficient documentation

## 2013-04-28 DIAGNOSIS — Z8719 Personal history of other diseases of the digestive system: Secondary | ICD-10-CM | POA: Insufficient documentation

## 2013-04-28 DIAGNOSIS — F410 Panic disorder [episodic paroxysmal anxiety] without agoraphobia: Secondary | ICD-10-CM | POA: Insufficient documentation

## 2013-04-28 DIAGNOSIS — Z8679 Personal history of other diseases of the circulatory system: Secondary | ICD-10-CM | POA: Insufficient documentation

## 2013-04-28 DIAGNOSIS — F4321 Adjustment disorder with depressed mood: Secondary | ICD-10-CM | POA: Insufficient documentation

## 2013-04-28 DIAGNOSIS — F429 Obsessive-compulsive disorder, unspecified: Secondary | ICD-10-CM | POA: Insufficient documentation

## 2013-04-28 DIAGNOSIS — R0789 Other chest pain: Secondary | ICD-10-CM | POA: Insufficient documentation

## 2013-04-28 DIAGNOSIS — F17200 Nicotine dependence, unspecified, uncomplicated: Secondary | ICD-10-CM | POA: Insufficient documentation

## 2013-04-28 DIAGNOSIS — Z79899 Other long term (current) drug therapy: Secondary | ICD-10-CM | POA: Insufficient documentation

## 2013-04-28 DIAGNOSIS — Z880 Allergy status to penicillin: Secondary | ICD-10-CM | POA: Insufficient documentation

## 2013-04-28 DIAGNOSIS — R000 Tachycardia, unspecified: Secondary | ICD-10-CM | POA: Insufficient documentation

## 2013-04-28 DIAGNOSIS — R0602 Shortness of breath: Secondary | ICD-10-CM | POA: Insufficient documentation

## 2013-04-28 DIAGNOSIS — Z9109 Other allergy status, other than to drugs and biological substances: Secondary | ICD-10-CM | POA: Insufficient documentation

## 2013-04-28 MED ORDER — ALPRAZOLAM 0.5 MG PO TABS: 0.5000 mg | ORAL_TABLET | Freq: Three times a day (TID) | ORAL | Status: DC | PRN

## 2013-04-28 MED ORDER — HYDROCODONE-ACETAMINOPHEN 5-325 MG PO TABS
1.0000 | ORAL_TABLET | Freq: Once | ORAL | Status: AC
  Administered 2013-04-28: 1 via ORAL
  Filled 2013-04-28: qty 1

## 2013-04-28 MED ORDER — DIAZEPAM 5 MG/ML IJ SOLN
5.0000 mg | Freq: Once | INTRAMUSCULAR | Status: AC
  Administered 2013-04-28: 5 mg via INTRAMUSCULAR
  Filled 2013-04-28: qty 2

## 2013-04-28 NOTE — ED Provider Notes (Signed)
History    CSN: [id number] Arrival date & time 04/28/13  1149  First MD Initiated Contact with Patient 04/28/13 1157     Chief Complaint  Patient presents with  . Panic Attack   (Consider location/radiation/quality/duration/timing/severity/associated sxs/prior Treatment) HPI Comments: Kathryn Olson is a 51 y.o. Female presenting with panic attack and anxiety.  She is here to view a family member who was just pronounced dead which was an unexpected death for this family.  Kathryn Olson has a history of anxiety and occasional panic attacks and has had an escalation of crying,  Shortness of breath,  Hyperventilation and chest pressure since arriving here.  She denies nausea or emesis.  She has taken xanax in the past for anxiety,  But not recently.       The history is provided by the patient and the spouse.   Past Medical History  Diagnosis Date  . Environmental allergies   . Anxiety   . Family history of anesthesia complication     MH during C Section  . 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 Complication of anesthesia   . H/O colonoscopy   . Migraine   . Crohn's disease June 2013  . IBS (irritable bowel syndrome) June 2014  . Obsessive-compulsive disorder   . PTSD (post-traumatic stress disorder)    Past Surgical History  Procedure Laterality Date  . Partial hysterectomy    . Tubal ligation    . Cesarean section      X2  . Bladder surgery      age 89  . Esophagogastroduodenoscopy      remote, had ulcers, [place], Dr. Truddie Coco  . Colonoscopy  05/22/2012    SLF: Polyps, multiple hyperplastic in the sigmoid colon/Polyp in the rectum/  MODERATE Diverticulosis throughout the colon/ Internal hemorrhoids  . Esophagogastroduodenoscopy  05/22/2012    SLF: Stricture in the distal esophagus/Moderate gastritis   Family History  Problem Relation Age of Onset  . Colon cancer Maternal Grandfather     ?less than age 50s  . Heart attack  Father   . Alcohol abuse Father   . Diabetes Mother   . Hypertension Mother   . Dementia Mother   . Depression Mother   . Inflammatory bowel disease Neg Hx   . ADD / ADHD Neg Hx   . Drug abuse Neg Hx   . Paranoid behavior Neg Hx   . Schizophrenia Neg Hx   . Seizures Neg Hx   . Sexual abuse Neg Hx   . Physical abuse Neg Hx   . Liver disease Brother     does know details, but related to MVA?  Marland Kitchen Anxiety disorder Sister   . Bipolar disorder Sister   . OCD Sister   . Anxiety disorder Sister   . Depression Sister   . Malignant hyperthermia Daughter   . Heart murmur Son    History  Substance Use Topics  . Smoking status: Light Tobacco Smoker    Last Attempt to Quit: 01/01/1989  . Smokeless tobacco: Never Used     Comment: started back every day as of 03/30/2013  . Alcohol Use: No     Comment: socially   OB History   Grav Para Term Preterm Abortions TAB SAB Ect Mult Living                 Review of Systems  Constitutional: Negative for fever and diaphoresis.  HENT: Negative for congestion, sore throat and neck pain.   Eyes: Negative.   Respiratory: Positive for chest tightness and shortness of breath.   Cardiovascular: Negative for chest pain and palpitations.  Gastrointestinal: Negative for nausea and abdominal pain.  Genitourinary: Negative.   Musculoskeletal: Negative for joint swelling and arthralgias.  Skin: Negative.  Negative for rash and wound.  Neurological: Negative for dizziness, weakness, light-headedness, numbness and headaches.  Psychiatric/Behavioral: The patient is nervous/anxious.     Allergies  Aspirin; Peanut-containing drug products; Penicillins; Sulfa antibiotics; Codeine; and Rubbing alcohol  Home Medications   Current Outpatient Rx  Name  Route  Sig  Dispense  Refill  . acetaminophen (TYLENOL) 500 MG tablet   Oral   Take 2,000 mg by mouth at bedtime as needed for pain.         . baclofen (LIORESAL) 10 MG tablet   Oral   Take 1 tablet  (10 mg total) by mouth daily.   30 tablet   1   . busPIRone (BUSPAR) 15 MG tablet   Oral   Take 1 tablet (15 mg total) by mouth 4 (four) times daily.   120 tablet   1   . Carbamazepine, Antipsychotic, (EQUETRO) 300 MG CP12   Oral   Take 1 capsule (300 mg total) by mouth at bedtime.   35 each   0   . docusate sodium (COLACE) 100 MG capsule   Oral   Take 100 mg by mouth 2 (two) times daily.         Marland Kitchen esomeprazole (NEXIUM) 40 MG capsule   Oral   Take 40 mg by mouth daily before breakfast.         . Linaclotide (LINZESS) 145 MCG CAPS   Oral   Take 1 capsule (145 mcg total) by mouth daily before breakfast.   90 capsule   0   . ondansetron (ZOFRAN) 4 MG tablet   Oral   Take 1 tablet (4 mg total) by mouth every 6 (six) hours as needed for nausea.   30 tablet   1   . ALPRAZolam (XANAX) 0.5 MG tablet   Oral   Take 1 tablet (0.5 mg total) by mouth 3 (three) times daily as needed for anxiety.   15 tablet   0   . EPINEPHrine (EPI-PEN) 0.3 mg/0.3 mL DEVI   Intramuscular   Inject 0.3 mg into the muscle as needed (for allergic reaction).           BP 145/89  Pulse 108  Temp(Src) 98.3 F (36.8 C) (Oral)  Resp 22  Ht 5' 2"  (1.575 m)  Wt 159 lb (72.122 kg)  BMI 29.07 kg/m2  SpO2 98% Physical Exam  Nursing note and vitals reviewed. Constitutional: She appears well-developed and well-nourished.  Non-toxic appearance. She appears distressed.  Actively crying,  Hyperventilating.    HENT:  Head: Normocephalic and atraumatic.  Eyes: Conjunctivae are normal.  Neck: Normal range of motion.  Cardiovascular: Regular rhythm, normal heart sounds and intact distal pulses.  Tachycardia present.   Pulmonary/Chest: Effort normal and breath sounds normal. Not tachypneic. No respiratory distress. She has no wheezes.  Abdominal: Soft. Bowel sounds are normal. There is no tenderness.  Musculoskeletal: Normal range of motion.  Neurological: She is alert.  Skin: Skin is warm and  dry.  Psychiatric: Her speech is normal. Thought content normal. Her mood appears anxious.  Actively crying, rocking in her chair.    ED Course  Procedures (including critical care  time)    Date: 04/28/2013  Rate: 71  Rhythm: normal sinus rhythm  QRS Axis: normal  Intervals: normal  ST/T Wave abnormalities: normal  Conduction Disutrbances:none  Narrative Interpretation:   Old EKG Reviewed: none available     Labs Reviewed - No data to display No results found. 1. Panic attack as reaction to stress     MDM  Pt with active panic attack with grief reaction.  She was given valium 5 mg IM,  At recheck she was more calm,  No longer having hyperventilation,  No longer having chest tightness.  Still tearful,  Complaint of headache.  Given hydrocodone po.  Prescription for short course of xanax.  Pt calmer at time of dc,  States she is ready to go home.  Family members including husband with her.  Evalee Jefferson, PA-C 04/28/13 1406

## 2013-04-28 NOTE — ED Notes (Signed)
Pt here because her best friend just passed away and pt has history of anxiety and panic attacks.  Pt very tearful and per husband requested to sign in to ED to get something to help her calm down.

## 2013-04-28 NOTE — ED Provider Notes (Signed)
Date: 04/28/2013 1326  Rate: 71  Rhythm: normal sinus rhythm  QRS Axis: normal  Intervals: normal  ST/T Wave abnormalities: normal  Conduction Disutrbances:none    Sharyon Cable, MD 04/28/13 1359

## 2013-04-28 NOTE — ED Provider Notes (Signed)
Medical screening examination/treatment/procedure(s) were performed by non-physician practitioner and as supervising physician I was immediately available for consultation/collaboration.   Sharyon Cable, MD 04/28/13 [phone]

## 2013-07-16 ENCOUNTER — Other Ambulatory Visit: Payer: Self-pay | Admitting: Family Medicine

## 2013-07-16 DIAGNOSIS — Z1231 Encounter for screening mammogram for malignant neoplasm of breast: Secondary | ICD-10-CM

## 2013-07-31 ENCOUNTER — Telehealth: Payer: Self-pay | Admitting: Nurse Practitioner

## 2013-07-31 NOTE — Telephone Encounter (Signed)
Triage to make appointment

## 2013-08-02 ENCOUNTER — Ambulatory Visit
Admission: RE | Admit: 2013-08-02 | Discharge: 2013-08-02 | Disposition: A | Discharge status: Home / Self Care | Payer: Managed Care, Other (non HMO) | Source: Ambulatory Visit | Attending: Family Medicine | Admitting: Family Medicine

## 2013-08-02 DIAGNOSIS — Z1231 Encounter for screening mammogram for malignant neoplasm of breast: Secondary | ICD-10-CM

## 2013-08-03 ENCOUNTER — Other Ambulatory Visit: Payer: Self-pay | Admitting: Nurse Practitioner

## 2013-08-03 DIAGNOSIS — R928 Other abnormal and inconclusive findings on diagnostic imaging of breast: Secondary | ICD-10-CM

## 2013-08-06 NOTE — Telephone Encounter (Signed)
No return call from patient. Will return call to office if appt needed

## 2013-08-08 ENCOUNTER — Other Ambulatory Visit: Payer: Self-pay | Admitting: Family Medicine

## 2013-08-08 DIAGNOSIS — R928 Other abnormal and inconclusive findings on diagnostic imaging of breast: Secondary | ICD-10-CM

## 2013-08-23 ENCOUNTER — Ambulatory Visit
Admission: RE | Admit: 2013-08-23 | Discharge: 2013-08-23 | Disposition: A | Discharge status: Home / Self Care | Payer: Managed Care, Other (non HMO) | Source: Ambulatory Visit | Attending: Family Medicine | Admitting: Family Medicine

## 2013-08-23 DIAGNOSIS — R928 Other abnormal and inconclusive findings on diagnostic imaging of breast: Secondary | ICD-10-CM

## 2013-09-04 ENCOUNTER — Encounter: Payer: Self-pay | Admitting: Nurse Practitioner

## 2013-09-04 ENCOUNTER — Ambulatory Visit (INDEPENDENT_AMBULATORY_CARE_PROVIDER_SITE_OTHER): Payer: Managed Care, Other (non HMO) | Admitting: Nurse Practitioner

## 2013-09-04 VITALS — BP 113/76 | HR 86 | Temp 99.1°F | Ht 62.0 in | Wt 174.0 lb

## 2013-09-04 DIAGNOSIS — F329 Major depressive disorder, single episode, unspecified: Secondary | ICD-10-CM | POA: Insufficient documentation

## 2013-09-04 DIAGNOSIS — M25512 Pain in left shoulder: Secondary | ICD-10-CM

## 2013-09-04 DIAGNOSIS — Z01419 Encounter for gynecological examination (general) (routine) without abnormal findings: Secondary | ICD-10-CM

## 2013-09-04 DIAGNOSIS — Z124 Encounter for screening for malignant neoplasm of cervix: Secondary | ICD-10-CM

## 2013-09-04 DIAGNOSIS — F4310 Post-traumatic stress disorder, unspecified: Secondary | ICD-10-CM

## 2013-09-04 DIAGNOSIS — F341 Dysthymic disorder: Secondary | ICD-10-CM

## 2013-09-04 DIAGNOSIS — K589 Irritable bowel syndrome without diarrhea: Secondary | ICD-10-CM | POA: Insufficient documentation

## 2013-09-04 DIAGNOSIS — K5090 Crohn's disease, unspecified, without complications: Secondary | ICD-10-CM | POA: Insufficient documentation

## 2013-09-04 DIAGNOSIS — Z0000 Encounter for general adult medical examination without abnormal findings: Secondary | ICD-10-CM

## 2013-09-04 LAB — POCT URINALYSIS DIPSTICK
Glucose, UA: NEGATIVE
Ketones, UA: NEGATIVE
Leukocytes, UA: NEGATIVE
Protein, UA: NEGATIVE

## 2013-09-04 LAB — POCT CBC
HCT, POC: 43.6 % (ref 37.7–47.9)
Lymph, poc: 2.6 (ref 0.6–3.4)
MCHC: 32.6 g/dL (ref 31.8–35.4)
MCV: 87.2 fL (ref 80–97)
MPV: 9.3 fL (ref 0–99.8)
POC Granulocyte: 5.4 (ref 2–6.9)
Platelet Count, POC: 242 10*3/uL (ref 142–424)
RDW, POC: 13.5 %
WBC: 8.3 10*3/uL (ref 4.6–10.2)

## 2013-09-04 LAB — POCT UA - MICROSCOPIC ONLY: Casts, Ur, LPF, POC: NEGATIVE

## 2013-09-04 MED ORDER — BUSPIRONE HCL 15 MG PO TABS: 15.0000 mg | ORAL_TABLET | Freq: Four times a day (QID) | ORAL | Status: DC

## 2013-09-04 MED ORDER — LINACLOTIDE 145 MCG PO CAPS: 145.0000 ug | ORAL_CAPSULE | Freq: Two times a day (BID) | ORAL | Status: DC

## 2013-09-04 MED ORDER — ESOMEPRAZOLE MAGNESIUM 40 MG PO CPDR: 40.0000 mg | DELAYED_RELEASE_CAPSULE | Freq: Every day | ORAL | Status: DC

## 2013-09-04 MED ORDER — ALPRAZOLAM 0.5 MG PO TABS: 0.5000 mg | ORAL_TABLET | Freq: Every evening | ORAL | Status: DC | PRN

## 2013-09-04 NOTE — Patient Instructions (Signed)

## 2013-09-04 NOTE — Progress Notes (Signed)
Subjective:    Patient ID: Kathryn Olson, female    DOB: 1961/07/04, 52 y.o.   MRN: [id number]  HPI  Patient in today for annual physical exam and PAP- SHe is on several  meds and has several medical problems- she is doing well right now-. 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 Gilford Rile- now she sees Anabel Bene in mental health.  Patient Active Problem List   Diagnosis Date Noted  . Crohn's disease 09/04/2013  . IBS (irritable bowel syndrome) 09/04/2013  . Depression 09/04/2013  . Dysthymic disorder 01/29/2013  . PTSD (post-traumatic stress disorder) 01/29/2013  . Chronic back pain greater than 3 months duration 01/29/2013  . CNS disorder 01/29/2013  . Insomnia secondary to depression with anxiety 01/29/2013  . Insomnia secondary to chronic pain 01/29/2013  . Constipation 05/10/2012  . Weight loss, abnormal 05/10/2012  . Abdominal pain 05/10/2012  . Vomiting 05/10/2012  . GERD (gastroesophageal reflux disease) 05/10/2012  . Abnormal CT scan, colon 05/10/2012    Outpatient Encounter Prescriptions as of 09/04/2013  Medication Sig  . ALPRAZolam (XANAX) 0.5 MG tablet Take 1 tablet (0.5 mg total) by mouth 3 (three) times daily as needed for anxiety.  . busPIRone (BUSPAR) 15 MG tablet Take 1 tablet (15 mg total) by mouth 4 (four) times daily.  . calcium carbonate (TUMS - DOSED IN MG ELEMENTAL CALCIUM) 500 MG chewable tablet Chew 1 tablet by mouth daily.  . Carbamazepine, Antipsychotic, (EQUETRO) 300 MG CP12 Take 1 capsule (300 mg total) by mouth at bedtime.  . docusate sodium (COLACE) 100 MG capsule Take 100 mg by mouth 2 (two) times daily.  Marland Kitchen EPINEPHrine (EPI-PEN) 0.3 mg/0.3 mL DEVI Inject 0.3 mg into the muscle as needed (for allergic reaction).   Marland Kitchen esomeprazole (NEXIUM) 40 MG capsule Take 40 mg by mouth daily before breakfast.  . Linaclotide (LINZESS) 145 MCG CAPS capsule Take 145 mcg by mouth 2 (two) times daily.  . ondansetron (ZOFRAN) 4 MG tablet Take 1 tablet (4 mg  total) by mouth every 6 (six) hours as needed for nausea.  . [DISCONTINUED] Linaclotide (LINZESS) 145 MCG CAPS Take 1 capsule (145 mcg total) by mouth daily before breakfast.  . [DISCONTINUED] acetaminophen (TYLENOL) 500 MG tablet Take 2,000 mg by mouth at bedtime as needed for pain.  . [DISCONTINUED] baclofen (LIORESAL) 10 MG tablet Take 1 tablet (10 mg total) by mouth daily.     Review of Systems  Constitutional: Negative.   HENT: Negative.   Eyes: Negative.   Respiratory: Negative.   Cardiovascular: Negative.   Gastrointestinal: Negative.   Musculoskeletal:       Left shoulder limited ROM   Hematological: Negative.        Objective:   Physical Exam  Constitutional: She is oriented to person, place, and time. She appears well-developed and well-nourished.  HENT:  Head: Normocephalic.  Right Ear: Hearing, tympanic membrane, external ear and ear canal normal.  Left Ear: Hearing, tympanic membrane, external ear and ear canal normal.  Nose: Nose normal.  Mouth/Throat: Uvula is midline and oropharynx is clear and moist.  Eyes: Conjunctivae and EOM are normal. Pupils are equal, round, and reactive to light.  Neck: Normal range of motion and full passive range of motion without pain. Neck supple. No JVD present. Carotid bruit is not present. No mass and no thyromegaly present.  Cardiovascular: Normal rate, normal heart sounds and intact distal pulses.   No murmur heard. Pulmonary/Chest: Effort normal and breath sounds normal.  Right breast exhibits no inverted nipple, no mass, no nipple discharge, no skin change and no tenderness. Left breast exhibits no inverted nipple, no mass, no nipple discharge, no skin change and no tenderness.  Abdominal: Soft. Bowel sounds are normal. She exhibits no mass. There is no tenderness.  Genitourinary: Vagina normal and uterus normal. No breast swelling, tenderness, discharge or bleeding.  bimanual exam-No adnexal masses or tenderness. Vaginal cuff  intact  Musculoskeletal: Normal range of motion.  Decreased ROM of left shoulder due to pian with ext and flexion. Grips =Bil  Lymphadenopathy:    She has no cervical adenopathy.  Neurological: She is alert and oriented to person, place, and time. She has normal reflexes.  Skin: Skin is warm and dry.  Psychiatric: She has a normal mood and affect. Her behavior is normal. Judgment and thought content normal.     BP 113/76  Pulse 86  Temp(Src) 99.1 F (37.3 C) (Oral)  Ht 5' 2"  (1.575 m)  Wt 174 lb (78.926 kg)  BMI 31.82 kg/m2      Assessment & Plan:  . 1. Annual physical exam   2. Crohn's disease   3. IBS (irritable bowel syndrome)   4. Depression   5. Left shoulder pain   6. Encounter for routine gynecological examination   7. Dysthymic disorder   8. PTSD (post-traumatic stress disorder)    Orders Placed This Encounter  Procedures  . CMP14+EGFR  . NMR, lipoprofile  . Thyroid Panel With TSH  . Ambulatory referral to Orthopedic Surgery    Referral Priority:  Routine    Referral Type:  Surgical    Referral Reason:  Specialty Services Required    Requested Specialty:  Orthopedic Surgery    Number of Visits Requested:  1  . POCT UA - Microscopic Only  . POCT urinalysis dipstick  . POCT CBC   Meds ordered this encounter  Medications  . calcium carbonate (TUMS - DOSED IN MG ELEMENTAL CALCIUM) 500 MG chewable tablet    Sig: Chew 1 tablet by mouth daily.  Marland Kitchen DISCONTD: Linaclotide (LINZESS) 145 MCG CAPS capsule    Sig: Take 145 mcg by mouth 2 (two) times daily.  Marland Kitchen esomeprazole (NEXIUM) 40 MG capsule    Sig: Take 1 capsule (40 mg total) by mouth daily before breakfast.    Dispense:  30 capsule    Refill:  6    Order Specific Question:  Supervising Provider    Answer:  Chipper Herb [[id number]]  . busPIRone (BUSPAR) 15 MG tablet    Sig: Take 1 tablet (15 mg total) by mouth 4 (four) times daily.    Dispense:  120 tablet    Refill:  1    Order Specific Question:   Supervising Provider    Answer:  Chipper Herb [[id number]]  . Linaclotide (LINZESS) 145 MCG CAPS capsule    Sig: Take 1 capsule (145 mcg total) by mouth 2 (two) times daily.    Dispense:  60 capsule    Refill:  5    Order Specific Question:  Supervising Provider    Answer:  Chipper Herb [[id number]]  . ALPRAZolam (XANAX) 0.5 MG tablet    Sig: Take 1 tablet (0.5 mg total) by mouth at bedtime as needed for anxiety.    Dispense:  30 tablet    Refill:  0    Order Specific Question:  Supervising Provider    Answer:  Sharyon Cable [phone]    Continue all meds Labs  pending Diet and exercise encouraged  Health maintenance reviewed Follow up in 6 months  [place], FNP

## 2013-09-05 LAB — NMR, LIPOPROFILE
LDL Particle Number: 1555 nmol/L — ABNORMAL HIGH (ref <1000)
LDL Size: 20.6 nm (ref >20.5)
LP-IR Score: 51 — ABNORMAL HIGH (ref <=45)

## 2013-09-05 LAB — CMP14+EGFR
ALT: 11 IU/L (ref 0–32)
Albumin: 4.3 g/dL (ref 3.5–5.5)
Alkaline Phosphatase: 94 IU/L (ref 39–117)
BUN/Creatinine Ratio: 21 (ref 9–23)
BUN: 15 mg/dL (ref 6–24)
CO2: 27 mmol/L (ref 18–29)
Chloride: 104 mmol/L (ref 97–108)
Creatinine, Ser: 0.72 mg/dL (ref 0.57–1.00)
GFR calc Af Amer: 111 mL/min/{1.73_m2} (ref >59)
Globulin, Total: 2.7 g/dL (ref 1.5–4.5)
Glucose: 113 mg/dL — ABNORMAL HIGH (ref 65–99)
Potassium: 5.2 mmol/L (ref 3.5–5.2)
Total Bilirubin: 0.2 mg/dL (ref 0.0–1.2)
Total Protein: 7 g/dL (ref 6.0–8.5)

## 2013-09-05 LAB — THYROID PANEL WITH TSH
T3 Uptake Ratio: 32 % (ref 24–39)
T4, Total: 7.5 ug/dL (ref 4.5–12.0)
TSH: 0.944 u[IU]/mL (ref 0.450–4.500)

## 2013-09-07 LAB — PAP IG W/ RFLX HPV ASCU

## 2013-10-01 ENCOUNTER — Telehealth: Payer: Self-pay | Admitting: *Deleted

## 2013-10-04 ENCOUNTER — Telehealth: Payer: Self-pay | Admitting: Nurse Practitioner

## 2013-10-04 NOTE — Telephone Encounter (Signed)
Unable to reach patient by phone. Left detailed message on her personal voicemail with normal results and need to repeat lipid panel in 6 months after watching diet and increasing exercise level.

## 2013-10-08 NOTE — Telephone Encounter (Signed)
Patient aware.

## 2013-12-16 ENCOUNTER — Emergency Department (HOSPITAL_COMMUNITY): Payer: Managed Care, Other (non HMO)

## 2013-12-16 ENCOUNTER — Encounter (HOSPITAL_COMMUNITY): Payer: Self-pay | Admitting: Emergency Medicine

## 2013-12-16 ENCOUNTER — Emergency Department (HOSPITAL_COMMUNITY)
Admission: EM | Admit: 2013-12-16 | Discharge: 2013-12-16 | Disposition: A | Discharge status: Home / Self Care | Payer: Managed Care, Other (non HMO) | Attending: Emergency Medicine | Admitting: Emergency Medicine

## 2013-12-16 DIAGNOSIS — Z792 Long term (current) use of antibiotics: Secondary | ICD-10-CM | POA: Insufficient documentation

## 2013-12-16 DIAGNOSIS — J40 Bronchitis, not specified as acute or chronic: Secondary | ICD-10-CM

## 2013-12-16 DIAGNOSIS — F411 Generalized anxiety disorder: Secondary | ICD-10-CM | POA: Insufficient documentation

## 2013-12-16 DIAGNOSIS — R197 Diarrhea, unspecified: Secondary | ICD-10-CM | POA: Insufficient documentation

## 2013-12-16 DIAGNOSIS — Z8679 Personal history of other diseases of the circulatory system: Secondary | ICD-10-CM | POA: Insufficient documentation

## 2013-12-16 DIAGNOSIS — Z79899 Other long term (current) drug therapy: Secondary | ICD-10-CM | POA: Insufficient documentation

## 2013-12-16 DIAGNOSIS — J209 Acute bronchitis, unspecified: Secondary | ICD-10-CM | POA: Insufficient documentation

## 2013-12-16 DIAGNOSIS — F4310 Post-traumatic stress disorder, unspecified: Secondary | ICD-10-CM | POA: Insufficient documentation

## 2013-12-16 DIAGNOSIS — R110 Nausea: Secondary | ICD-10-CM | POA: Insufficient documentation

## 2013-12-16 DIAGNOSIS — G40909 Epilepsy, unspecified, not intractable, without status epilepticus: Secondary | ICD-10-CM | POA: Insufficient documentation

## 2013-12-16 DIAGNOSIS — Z880 Allergy status to penicillin: Secondary | ICD-10-CM | POA: Insufficient documentation

## 2013-12-16 DIAGNOSIS — Z87891 Personal history of nicotine dependence: Secondary | ICD-10-CM | POA: Insufficient documentation

## 2013-12-16 DIAGNOSIS — Z8719 Personal history of other diseases of the digestive system: Secondary | ICD-10-CM | POA: Insufficient documentation

## 2013-12-16 HISTORY — DX: Unspecified convulsions: R56.9

## 2013-12-16 LAB — CBC WITH DIFFERENTIAL/PLATELET
BASOS ABS: 0 10*3/uL (ref 0.0–0.1)
BASOS PCT: 0 % (ref 0–1)
Eosinophils Absolute: 0.2 10*3/uL (ref 0.0–0.7)
Eosinophils Relative: 2 % (ref 0–5)
HCT: 41.5 % (ref 36.0–46.0)
HEMOGLOBIN: 13.9 g/dL (ref 12.0–15.0)
LYMPHS PCT: 20 % (ref 12–46)
Lymphs Abs: 1.7 10*3/uL (ref 0.7–4.0)
MCH: 30.2 pg (ref 26.0–34.0)
MCHC: 33.5 g/dL (ref 30.0–36.0)
MCV: 90 fL (ref 78.0–100.0)
MONO ABS: 0.5 10*3/uL (ref 0.1–1.0)
MONOS PCT: 6 % (ref 3–12)
NEUTROS ABS: 6.3 10*3/uL (ref 1.7–7.7)
Neutrophils Relative %: 72 % (ref 43–77)
Platelets: 228 10*3/uL (ref 150–400)
RBC: 4.61 MIL/uL (ref 3.87–5.11)
RDW: 13.6 % (ref 11.5–15.5)
WBC: 8.7 10*3/uL (ref 4.0–10.5)

## 2013-12-16 LAB — COMPREHENSIVE METABOLIC PANEL
ALT: 15 U/L (ref 0–35)
AST: 17 U/L (ref 0–37)
Albumin: 3.7 g/dL (ref 3.5–5.2)
Alkaline Phosphatase: 100 U/L (ref 39–117)
BILIRUBIN TOTAL: 0.2 mg/dL — AB (ref 0.3–1.2)
BUN: 15 mg/dL (ref 6–23)
CALCIUM: 9.2 mg/dL (ref 8.4–10.5)
CHLORIDE: 103 meq/L (ref 96–112)
CO2: 27 meq/L (ref 19–32)
Creatinine, Ser: 0.74 mg/dL (ref 0.50–1.10)
GFR calc Af Amer: >90 mL/min (ref >90)
Glucose, Bld: 117 mg/dL — ABNORMAL HIGH (ref 70–99)
Potassium: 4.1 mEq/L (ref 3.7–5.3)
Sodium: 140 mEq/L (ref 137–147)
Total Protein: 7.4 g/dL (ref 6.0–8.3)

## 2013-12-16 MED ORDER — AZITHROMYCIN 250 MG PO TABS
500.0000 mg | ORAL_TABLET | Freq: Once | ORAL | Status: AC
  Administered 2013-12-16: 500 mg via ORAL
  Filled 2013-12-16: qty 2

## 2013-12-16 MED ORDER — BENZONATATE 100 MG PO CAPS: 100.0000 mg | ORAL_CAPSULE | Freq: Three times a day (TID) | ORAL | Status: DC

## 2013-12-16 MED ORDER — ACETAMINOPHEN 500 MG PO TABS
1000.0000 mg | ORAL_TABLET | Freq: Once | ORAL | Status: AC
  Administered 2013-12-16: 1000 mg via ORAL
  Filled 2013-12-16: qty 2

## 2013-12-16 MED ORDER — BENZONATATE 100 MG PO CAPS
200.0000 mg | ORAL_CAPSULE | Freq: Once | ORAL | Status: AC
  Administered 2013-12-16: 200 mg via ORAL
  Filled 2013-12-16: qty 2

## 2013-12-16 MED ORDER — AZITHROMYCIN 250 MG PO TABS: 250.0000 mg | ORAL_TABLET | Freq: Every day | ORAL | Status: DC

## 2013-12-16 MED ORDER — SODIUM CHLORIDE 0.9 % IV BOLUS (SEPSIS)
1000.0000 mL | Freq: Once | INTRAVENOUS | Status: AC
  Administered 2013-12-16: 1000 mL via INTRAVENOUS

## 2013-12-16 MED ORDER — ONDANSETRON HCL 4 MG/2ML IJ SOLN
4.0000 mg | Freq: Once | INTRAMUSCULAR | Status: AC
  Administered 2013-12-16: 4 mg via INTRAVENOUS
  Filled 2013-12-16: qty 2

## 2013-12-16 NOTE — ED Notes (Signed)
Cough and congestion since yesterday. C/O headache rating pain 10/10. C/O pain in chest with cough rating it 8/10. Lung sounds clear bilaterally. Pt achy all over. No reported fevers (did not check at home) but felt warm and reports having chills. Will continue to monitor.

## 2013-12-16 NOTE — ED Notes (Signed)
Pt c/o chest congestion, cough and head congestion since last night, unknown of fever, + nausea and diarrhea

## 2013-12-16 NOTE — ED Provider Notes (Signed)
CSN: [id number]     Arrival date & time 12/16/13  1108 History   First MD Initiated Contact with Patient 12/16/13 1124     Chief Complaint  Patient presents with  . Cough     (Consider location/radiation/quality/duration/timing/severity/associated sxs/prior Treatment) HPI Comments: Kathryn Olson is a 53 y.o. Female presenting with 1 day history of flu like symptoms which includes persistent cough with clear sputum production, generalized body aches, headache, nasal congestion with clear rhinorrhea,  low grade subjective fever, nausea without emesis and diarrhea which was limited to yesterday, describing nonbloody watery diarrhea which has improved with no episodes today.  She has not taken an anti-diarrheal.  Symptoms due to not include shortness of breath, neck pain or stiffness, abdominal pain or vomiting. She has had nausea and has had episodes of near post tussive emesis.  She also describes burning chest pain which is present only when coughing.  The patient has taken tylenol prior to arrival with no significant improvement in symptoms.      The history is provided by the patient and the spouse.    Past Medical History  Diagnosis Date  . Environmental allergies   . Anxiety   . Family history of anesthesia complication     MH during C Section  . 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 Complication of anesthesia   . H/O colonoscopy   . Migraine   . Crohn's disease June 2013  . IBS (irritable bowel syndrome) June 2014  . Obsessive-compulsive disorder   . PTSD (post-traumatic stress disorder)   . Seizures    Past Surgical History  Procedure Laterality Date  . Partial hysterectomy    . Tubal ligation    . Cesarean section      X2  . Bladder surgery      age 72  . Esophagogastroduodenoscopy      remote, had ulcers, [place], Dr. Truddie Coco  . Colonoscopy  05/22/2012    SLF: Polyps, multiple hyperplastic in the sigmoid colon/Polyp in the  rectum/  MODERATE Diverticulosis throughout the colon/ Internal hemorrhoids  . Esophagogastroduodenoscopy  05/22/2012    SLF: Stricture in the distal esophagus/Moderate gastritis   Family History  Problem Relation Age of Onset  . Colon cancer Maternal Grandfather     ?less than age 37s  . Heart attack Father   . Alcohol abuse Father   . Diabetes Mother   . Hypertension Mother   . Dementia Mother   . Depression Mother   . Inflammatory bowel disease Neg Hx   . ADD / ADHD Neg Hx   . Drug abuse Neg Hx   . Paranoid behavior Neg Hx   . Schizophrenia Neg Hx   . Seizures Neg Hx   . Sexual abuse Neg Hx   . Physical abuse Neg Hx   . Liver disease Brother     does know details, but related to MVA?  Marland Kitchen Anxiety disorder Sister   . Bipolar disorder Sister   . OCD Sister   . Anxiety disorder Sister   . Depression Sister   . Malignant hyperthermia Daughter   . Heart murmur Son    History  Substance Use Topics  . Smoking status: Former Smoker    Quit date: 01/01/1989  . Smokeless tobacco: Never Used     Comment: started back every day as of 03/30/2013  . Alcohol Use: No     Comment: socially   OB  History   Grav Para Term Preterm Abortions TAB SAB Ect Mult Living                 Review of Systems  Constitutional: Positive for fever and chills.  HENT: Positive for congestion and rhinorrhea. Negative for sore throat.   Eyes: Negative.   Respiratory: Positive for cough. Negative for chest tightness and shortness of breath.   Cardiovascular: Positive for chest pain.  Gastrointestinal: Positive for nausea and diarrhea. Negative for vomiting and abdominal pain.  Genitourinary: Negative.  Negative for dysuria.  Musculoskeletal: Positive for myalgias. Negative for arthralgias, joint swelling and neck pain.  Skin: Negative.  Negative for rash and wound.  Neurological: Negative for dizziness, weakness, light-headedness, numbness and headaches.  Psychiatric/Behavioral: Negative.        Allergies  Aspirin; Peanut-containing drug products; Penicillins; Sulfa antibiotics; Codeine; and Rubbing alcohol  Home Medications   Current Outpatient Rx  Name  Route  Sig  Dispense  Refill  . ALPRAZolam (XANAX) 0.5 MG tablet   Oral   Take 1 tablet (0.5 mg total) by mouth at bedtime as needed for anxiety.   30 tablet   0   . azithromycin (ZITHROMAX Z-PAK) 250 MG tablet   Oral   Take 1 tablet (250 mg total) by mouth daily.   4 tablet   0   . benzonatate (TESSALON) 100 MG capsule   Oral   Take 1 capsule (100 mg total) by mouth every 8 (eight) hours.   21 capsule   0   . busPIRone (BUSPAR) 15 MG tablet   Oral   Take 1 tablet (15 mg total) by mouth 4 (four) times daily.   120 tablet   1   . calcium carbonate (TUMS - DOSED IN MG ELEMENTAL CALCIUM) 500 MG chewable tablet   Oral   Chew 1 tablet by mouth daily.         . Carbamazepine, Antipsychotic, (EQUETRO) 300 MG CP12   Oral   Take 1 capsule (300 mg total) by mouth at bedtime.   35 each   0   . docusate sodium (COLACE) 100 MG capsule   Oral   Take 100 mg by mouth 2 (two) times daily.         Marland Kitchen EPINEPHrine (EPI-PEN) 0.3 mg/0.3 mL DEVI   Intramuscular   Inject 0.3 mg into the muscle as needed (for allergic reaction).          Marland Kitchen esomeprazole (NEXIUM) 40 MG capsule   Oral   Take 1 capsule (40 mg total) by mouth daily before breakfast.   30 capsule   6   . Linaclotide (LINZESS) 145 MCG CAPS capsule   Oral   Take 1 capsule (145 mcg total) by mouth 2 (two) times daily.   60 capsule   5   . ondansetron (ZOFRAN) 4 MG tablet   Oral   Take 1 tablet (4 mg total) by mouth every 6 (six) hours as needed for nausea.   30 tablet   1    BP 135/85  Pulse 88  Temp(Src) 98 F (36.7 C) (Oral)  Resp 18  Ht 5' 2"  (1.575 m)  Wt 187 lb 5 oz (84.964 kg)  BMI 34.25 kg/m2  SpO2 97% Physical Exam  Constitutional: She is oriented to person, place, and time. She appears well-developed and well-nourished.   HENT:  Head: Normocephalic and atraumatic.  Right Ear: Tympanic membrane and ear canal normal.  Left Ear: Tympanic membrane and ear  canal normal.  Nose: Mucosal edema and rhinorrhea present.  Mouth/Throat: Uvula is midline, oropharynx is clear and moist and mucous membranes are normal. No oropharyngeal exudate, posterior oropharyngeal edema, posterior oropharyngeal erythema or tonsillar abscesses.  Eyes: Conjunctivae are normal.  Neck: Normal range of motion. Neck supple.  Cardiovascular: Normal rate and normal heart sounds.   Pulmonary/Chest: Effort normal. No respiratory distress. She has decreased breath sounds in the right lower field. She has no wheezes. She has no rhonchi. She has no rales.  Abdominal: Soft. There is no tenderness.  Musculoskeletal: Normal range of motion.  Lymphadenopathy:    She has no cervical adenopathy.  Neurological: She is alert and oriented to person, place, and time.  Skin: Skin is warm and dry. No rash noted.  Psychiatric: She has a normal mood and affect.    ED Course  Procedures (including critical care time) Labs Review Labs Reviewed  COMPREHENSIVE METABOLIC PANEL - Abnormal; Notable for the following:    Glucose, Bld 117 (*)    Total Bilirubin 0.2 (*)    All other components within normal limits  CBC WITH DIFFERENTIAL   Imaging Review Dg Chest 2 View  12/16/2013   CLINICAL DATA:  Cough and shortness of breath with fever, chills and diarrhea 1 day.  EXAM: CHEST  2 VIEW  COMPARISON:  01/25/2012  FINDINGS: Lungs are adequately inflated as there is mild increased density in the posterior perihilar region on the lateral film. No evidence of effusion. Cardiomediastinal silhouette and remainder the exam is unchanged.  IMPRESSION: Mild increased density in the posterior parahilar region on the lateral film as cannot exclude an infectious process.   Electronically Signed   By: Marin Olp M.D.   On: 12/16/2013 12:35     EKG Interpretation None       MDM   Final diagnoses:  Bronchitis   Pt with a 1 day history of viral type symptoms. Bronchitis,  Also could be influenza.  Pt was given NS IV 1 L,  zofran and tessalon.  Nausea resolved, coughing improved.  She was given zithromax 500 mg, prescription for remaining z pack given equivocal cxr.  Encouraged rest, increased fluid intake. Recheck by pcp or return here for any worsening sx (weakness, sob,  Elevating fever, uncontrolled emesis,etc.)  Pt and husband understand plans.  Patients labs and/or radiological studies were viewed and considered during the medical decision making and disposition process. Pt's appears stable, vitals signs remained normal during visit.      Evalee Jefferson, PA-C 12/16/13 2131  Evalee Jefferson, PA-C 12/16/13 2132

## 2013-12-16 NOTE — Discharge Instructions (Signed)
Bronchitis Bronchitis is inflammation of the airways that extend from the windpipe into the lungs (bronchi). The inflammation often causes mucus to develop, which leads to a cough. If the inflammation becomes severe, it may cause shortness of breath. CAUSES  Bronchitis may be caused by:   Viral infections.   Bacteria.   Cigarette smoke.   Allergens, pollutants, and other irritants.  SIGNS AND SYMPTOMS  The most common symptom of bronchitis is a frequent cough that produces mucus. Other symptoms include:  Fever.   Body aches.   Chest congestion.   Chills.   Shortness of breath.   Sore throat.  DIAGNOSIS  Bronchitis is usually diagnosed through a medical history and physical exam. Tests, such as chest X-rays, are sometimes done to rule out other conditions.  TREATMENT  You may need to avoid contact with whatever caused the problem (smoking, for example). Medicines are sometimes needed. These may include:  Antibiotics. These may be prescribed if the condition is caused by bacteria.  Cough suppressants. These may be prescribed for relief of cough symptoms.   Inhaled medicines. These may be prescribed to help open your airways and make it easier for you to breathe.   Steroid medicines. These may be prescribed for those with recurrent (chronic) bronchitis. HOME CARE INSTRUCTIONS  Get plenty of rest.   Drink enough fluids to keep your urine clear or pale yellow (unless you have a medical condition that requires fluid restriction). Increasing fluids may help thin your secretions and will prevent dehydration.   Only take over-the-counter or prescription medicines as directed by your health care provider.  Only take antibiotics as directed. Make sure you finish them even if you start to feel better.  Avoid secondhand smoke, irritating chemicals, and strong fumes. These will make bronchitis worse. If you are a smoker, quit smoking. Consider using nicotine gum or  skin patches to help control withdrawal symptoms. Quitting smoking will help your lungs heal faster.   Put a cool-mist humidifier in your bedroom at night to moisten the air. This may help loosen mucus. Change the water in the humidifier daily. You can also run the hot water in your shower and sit in the bathroom with the door closed for 5 10 minutes.   Follow up with your health care provider as directed.   Wash your hands frequently to avoid catching bronchitis again or spreading an infection to others.  SEEK MEDICAL CARE IF: Your symptoms do not improve after 1 week of treatment.  SEEK IMMEDIATE MEDICAL CARE IF:  Your fever increases.  You have chills.   You have chest pain.   You have worsening shortness of breath.   You have bloody sputum.  You faint.  You have lightheadedness.  You have a severe headache.   You vomit repeatedly. MAKE SURE YOU:   Understand these instructions.  Will watch your condition.  Will get help right away if you are not doing well or get worse. Document Released: 10/04/2005 Document Revised: 07/25/2013 Document Reviewed: 05/29/2013 [facility] Patient Information 2014 [place].   Rest,  Make sure you are drinking plenty of fluids.  Take your next dose of zithromax tomorrow morning.  You may continue using the tessalon perles for cough suppression.

## 2013-12-18 NOTE — ED Provider Notes (Signed)
Medical screening examination/treatment/procedure(s) were performed by non-physician practitioner and as supervising physician I was immediately available for consultation/collaboration.   EKG Interpretation None        Luby Seamans B. Karle Starch, MD 12/18/13 [id number]

## 2014-01-11 ENCOUNTER — Telehealth: Payer: Self-pay | Admitting: Nurse Practitioner

## 2014-01-11 ENCOUNTER — Telehealth (HOSPITAL_COMMUNITY): Payer: Self-pay | Admitting: *Deleted

## 2014-01-11 ENCOUNTER — Telehealth: Payer: Self-pay | Admitting: Gastroenterology

## 2014-01-11 NOTE — Telephone Encounter (Signed)
Patients dismissed what do we need to do

## 2014-01-11 NOTE — Telephone Encounter (Signed)
Patient needs a written Rx for her Linzess and Nexium shes working on getting assistance, will be by Monday to pick up please advise

## 2014-01-11 NOTE — Telephone Encounter (Signed)
agree

## 2014-01-14 ENCOUNTER — Telehealth: Payer: Self-pay | Admitting: Gastroenterology

## 2014-01-14 MED ORDER — LINACLOTIDE 145 MCG PO CAPS: 145.0000 ug | ORAL_CAPSULE | Freq: Every day | ORAL | Status: DC

## 2014-01-14 MED ORDER — ESOMEPRAZOLE MAGNESIUM 40 MG PO CPDR: 40.0000 mg | DELAYED_RELEASE_CAPSULE | Freq: Every day | ORAL | Status: DC

## 2014-01-14 NOTE — Telephone Encounter (Addendum)
One time RX. Patient with history of noncompliance. Never had her MRI of the head done or her back in 2013.  Needs followup appointment with Dr. Oneida Alar.

## 2014-01-14 NOTE — Telephone Encounter (Signed)
Pt is aware and she said she will schedule OV when she comes to pick up prescriptions.

## 2014-01-14 NOTE — Telephone Encounter (Signed)
Pt was last seen in out office on 08/22/2012 by Vickey Huger, NP.  These prescriptions were given to her by Chevis Pretty in 08/2013.  Routing to Neil Crouch, PA for recommendation in Dr. Nona Dell absence today.

## 2014-01-14 NOTE — Telephone Encounter (Signed)
Pt is aware. She will schedule ov when she comes to pick up the prescriptions.

## 2014-01-14 NOTE — Telephone Encounter (Signed)
Patient came by front office window this morning saying she was here to pick up her 2 prescriptions that she had called about on Friday. I didn't have anything up front to give her and asked was it a prescription or samples she was needing and she said prescriptions. I spoke with DS and she said the patient hadn't been seen in some time and she was waiting for a reply from [place]. I told patient that the nurse was aware and was waiting for a reply back from extender and since we havent seen patient in awhile that she was due an OV and she quickly told me "No, she wasn't making an appointment because she doesn't have the copay". Patient said that SF told her that she would be on this medicine forever and she shouldn't have to come in. Please advise. Patient is wanting her Linzess and Nexium and she can be reached at [phone]

## 2014-01-14 NOTE — Telephone Encounter (Signed)
FYI to Neil Crouch, PA , who addressed the previous phone note.

## 2014-01-14 NOTE — Telephone Encounter (Signed)
I addressed earlier today. Should be RX printed for both Linzess and Nexium for one month. No further refills without OV though.

## 2014-01-14 NOTE — Addendum Note (Signed)
Addended by: Mahala Menghini on: 01/14/2014 10:48 AM   Modules accepted: Orders

## 2014-01-16 NOTE — Telephone Encounter (Signed)
Returned call to patient, see any further documentation about patients balance in our old practice management system.

## 2014-02-18 ENCOUNTER — Ambulatory Visit: Payer: Managed Care, Other (non HMO) | Attending: Orthopedic Surgery | Admitting: Physical Therapy

## 2014-02-18 DIAGNOSIS — M25519 Pain in unspecified shoulder: Secondary | ICD-10-CM | POA: Diagnosis not present

## 2014-02-18 DIAGNOSIS — R5381 Other malaise: Secondary | ICD-10-CM | POA: Diagnosis not present

## 2014-02-18 DIAGNOSIS — M545 Low back pain, unspecified: Secondary | ICD-10-CM | POA: Diagnosis not present

## 2014-02-18 DIAGNOSIS — M546 Pain in thoracic spine: Secondary | ICD-10-CM | POA: Insufficient documentation

## 2014-02-18 DIAGNOSIS — IMO0001 Reserved for inherently not codable concepts without codable children: Secondary | ICD-10-CM | POA: Diagnosis not present

## 2014-02-18 DIAGNOSIS — K5090 Crohn's disease, unspecified, without complications: Secondary | ICD-10-CM | POA: Insufficient documentation

## 2014-02-18 DIAGNOSIS — M542 Cervicalgia: Secondary | ICD-10-CM | POA: Diagnosis not present

## 2014-02-20 ENCOUNTER — Ambulatory Visit: Payer: Managed Care, Other (non HMO) | Admitting: Physical Therapy

## 2014-02-20 DIAGNOSIS — IMO0001 Reserved for inherently not codable concepts without codable children: Secondary | ICD-10-CM | POA: Diagnosis not present

## 2014-02-21 ENCOUNTER — Encounter: Payer: Self-pay | Admitting: Gastroenterology

## 2014-02-21 ENCOUNTER — Ambulatory Visit (INDEPENDENT_AMBULATORY_CARE_PROVIDER_SITE_OTHER): Payer: Managed Care, Other (non HMO) | Admitting: Gastroenterology

## 2014-02-21 ENCOUNTER — Encounter (INDEPENDENT_AMBULATORY_CARE_PROVIDER_SITE_OTHER): Payer: Self-pay

## 2014-02-21 VITALS — BP 128/82 | HR 77 | Temp 97.9°F | Ht 62.0 in | Wt 189.4 lb

## 2014-02-21 DIAGNOSIS — K219 Gastro-esophageal reflux disease without esophagitis: Secondary | ICD-10-CM

## 2014-02-21 DIAGNOSIS — K589 Irritable bowel syndrome without diarrhea: Secondary | ICD-10-CM

## 2014-02-21 DIAGNOSIS — F329 Major depressive disorder, single episode, unspecified: Secondary | ICD-10-CM

## 2014-02-21 DIAGNOSIS — F3289 Other specified depressive episodes: Secondary | ICD-10-CM

## 2014-02-21 DIAGNOSIS — F32A Depression, unspecified: Secondary | ICD-10-CM

## 2014-02-21 MED ORDER — ESOMEPRAZOLE MAGNESIUM 40 MG PO CPDR: DELAYED_RELEASE_CAPSULE | ORAL | Status: DC

## 2014-02-21 MED ORDER — ONDANSETRON HCL 4 MG PO TABS: 4.0000 mg | ORAL_TABLET | Freq: Four times a day (QID) | ORAL | Status: DC | PRN

## 2014-02-21 MED ORDER — LINACLOTIDE 290 MCG PO CAPS: ORAL_CAPSULE | ORAL | Status: DC

## 2014-02-21 NOTE — Assessment & Plan Note (Addendum)
ASSOCIATED WITH CONSTIPATION  INCREASE LINZESS EAT FIBER DRINK WATER OPV IN 3 MOS

## 2014-02-21 NOTE — Progress Notes (Signed)
Subjective:    Patient ID: Kathryn Olson, female    DOB: [birth year], 53 y.o.   MRN: [id number]  Redge Gainer, MD  HPI Pt last seen 2013: 169 lbs. Now having trouble with neck pain, leg numbness, left sided abd pain, and constipation. Taking Liinzess. Helping. Still having hard stools. Occasional regurgitation: once was every day for a month. Now she's eating better and takes a Nexium in the evening sometimes and sx are fairly well controlled. STAYS COLD. NAUSEA: EVERY DAY. VOMITING: 1-2X/WEEK(LIQUID USUALLY). ONE TIME IT WAS BROWN"THROWING UP HER BOWEL". MAY HAVE ABD PAIN ALL THE TIME. DRINK WATER. EATS SOME FIBER: BROWN RICE, WHEAT BREAD. Marland Kitchen STOPPED IBUPROFEN: 2-3 MOS AGO. PT DENIES FEVER, CHILLS, BRBPR, nausea, vomiting, melena, DIARRHEA,  problems swallowing, OR problems with sedation.  Heartburn or indigestion: NEEDS EXTRA NEXIUM 2X/WEEK. AWAITING HER DISBAILITY. FEELS LIKE A SEVERE SHARP, STABBING. STARTS IN LEFT FLANK AND MOVES TO RIGHT UNDER THE NAVEL. DOESN'T CHANGE WITH BMs OR FOOD. FOOD DOESN'T MAKE IT WORSE. BM DOESN'T MAKE IT BETTER.   Past Medical History  Diagnosis Date  . Environmental allergies   . Anxiety   . Family history of anesthesia complication     MH during C Section  . 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 Complication of anesthesia   . H/O colonoscopy   . Migraine   . Crohn's disease June 2013  . IBS (irritable bowel syndrome) June 2014  . Obsessive-compulsive disorder   . PTSD (post-traumatic stress disorder)   . Seizures    Past Surgical History  Procedure Laterality Date  . Partial hysterectomy    . Tubal ligation    . Cesarean section      X2  . Bladder surgery      age 17  . Esophagogastroduodenoscopy      remote, had ulcers, [place], Dr. Truddie Coco  . Colonoscopy  05/22/2012    SLF: Polyps, multiple hyperplastic in the sigmoid colon/Polyp in the rectum/  MODERATE Diverticulosis throughout the colon/ Internal  hemorrhoids  . Esophagogastroduodenoscopy  05/22/2012    SLF: Stricture in the distal esophagus/Moderate gastritis   Allergies  Allergen Reactions  . Aspirin Anaphylaxis  . Peanut-Containing Drug Products Anaphylaxis       . Penicillins Anaphylaxis    Stops her heart  . Sulfa Antibiotics Anaphylaxis  . Codeine Itching  . Rubbing Alcohol [Alcohol] Rash    Current Outpatient Prescriptions  Medication Sig Dispense Refill  . ALPRAZolam (XANAX) 0.5 MG tablet Take 1 tablet (0.5 mg total) by mouth at bedtime as needed for anxiety.    . busPIRone (BUSPAR) 15 MG tablet Take 1 tablet (15 mg total) by mouth 4 (four) times daily.    . calcium carbonate (TUMS - DOSED IN MG ELEMENTAL CALCIUM) 500 MG chewable tablet Chew 1 tablet by mouth daily. OUT OF NEXIUM DUE TO COST OF MEDS   . Carbamazepine, Antipsychotic, (EQUETRO) 300 MG CP12 Take 1 capsule (300 mg total) by mouth at bedtime.    . docusate sodium (COLACE) 100 MG capsule Take 100 mg by mouth 2 (two) times daily. 2-3 X/DAY   . esomeprazole (NEXIUM) 40 MG capsule Take 1 capsule (40 mg total) by mouth daily before breakfast. LAST DOSE. LAST MO.   . traMADol (ULTRAM) 50 MG tablet Take by mouth every 6 (six) hours as needed.    Marland Kitchen EPINEPHrine (EPI-PEN) 0.3 mg/0.3 mL DEVI Inject 0.3 mg into the muscle as  needed (for allergic reaction).     . ondansetron (ZOFRAN) 4 MG tablet Take 1 tablet (4 mg total) by mouth every 6 (six) hours as needed for nausea. LAST DOSE MAR 2015     Family History  Problem Relation Age of Onset  . Colon cancer Maternal Grandfather     ?less than age 78s  . Heart attack Father   . Alcohol abuse Father   . Diabetes Mother   . Hypertension Mother   . Dementia Mother   . Depression Mother   . Inflammatory bowel disease Neg Hx   . ADD / ADHD Neg Hx   . Drug abuse Neg Hx   . Paranoid behavior Neg Hx   . Schizophrenia Neg Hx   . Seizures Neg Hx   . Sexual abuse Neg Hx   . Physical abuse Neg Hx   . Colon polyps Neg Hx    . Liver disease Brother     does know details, but related to MVA?  Marland Kitchen Anxiety disorder Sister   . Bipolar disorder Sister   . OCD Sister   . Anxiety disorder Sister   . Depression Sister   . Malignant hyperthermia Daughter   . Heart murmur Son     History   Social History  . Marital Status: Married    Spouse Name: N/A    Number of Children: 2  . Years of Education: N/A   Occupational History  . plant, manual labor, lifting    Social History Main Topics  . Smoking status: Former Smoker    Quit date: 01/01/1989  . Smokeless tobacco: Never Used     Comment: Quit a few years ago  . Alcohol Use: No     Comment: socially  . Drug Use: No  . Sexual Activity: Yes    Birth Control/ Protection: Surgical     Review of Systems PER HPI OTHERWISE ALL SYSTEMS ARE NEGATIVE.     Objective:   Physical Exam  Vitals reviewed. Constitutional: She is oriented to person, place, and time. No distress.  BECAME TEARFUL WHEN TALKING ABOUT PERSONAL LOSSES  HENT:  Head: Normocephalic and atraumatic.  Mouth/Throat: Oropharynx is clear and moist. No oropharyngeal exudate.  Eyes: Pupils are equal, round, and reactive to light. No scleral icterus.  TONGUE AND LEFT EYEBROW PIERCED  Neck: Normal range of motion. Neck supple.  Cardiovascular: Normal rate, regular rhythm and normal heart sounds.   Pulmonary/Chest: Effort normal and breath sounds normal. No respiratory distress.  Abdominal: Soft. Bowel sounds are normal. She exhibits no distension. There is tenderness. There is no rebound and no guarding.  MILD TTP IN THE EPIGASTRIUM, LLQ, AND LUQ   Musculoskeletal: She exhibits no edema.  WALKS ASSISTED WITH A WALKER  Lymphadenopathy:    She has no cervical adenopathy.  Neurological: She is alert and oriented to person, place, and time.  NO  NEW FOCAL DEFICITS   Psychiatric:  SLIGHTLY DEPRESSED MOOD, NL AFFECT           Assessment & Plan:

## 2014-02-21 NOTE — Patient Instructions (Signed)
CONTINUE NEXIUM. TAKE 30 MINUTES BEFORE MEALS TWICE DAILY.  CONTINUE YOUR WEIGHT LOSS EFFORTS. REVIEW MILITARY DIET.  OTHERWISE FOLLOW A LOW FAT DIET. SEE INFO BELOW.  CONTINUE LINZESS.  FOLLOW UP IN 3 MOS.

## 2014-02-21 NOTE — Addendum Note (Signed)
Addended by: Danie Binder on: 02/21/2014 03:04 PM   Modules accepted: Orders

## 2014-02-21 NOTE — Assessment & Plan Note (Signed)
MULTIPLE PSYCHOSOCIAL STRESSORS.  LAUGH TID Levy

## 2014-02-21 NOTE — Addendum Note (Signed)
Addended by: Danie Binder on: 02/21/2014 03:09 PM   Modules accepted: Orders

## 2014-02-21 NOTE — Assessment & Plan Note (Signed)
SX NOT IDEALLY CONTROLLED ON NEXIUM DAILY.  NEXIUM BID LOW FAT DIET OPV IN 3 MOS

## 2014-02-22 NOTE — Progress Notes (Signed)
Reminder in epic °

## 2014-02-25 NOTE — Progress Notes (Signed)
cc'd to pcp 

## 2014-02-26 ENCOUNTER — Ambulatory Visit: Payer: Managed Care, Other (non HMO) | Admitting: Physical Therapy

## 2014-02-26 DIAGNOSIS — IMO0001 Reserved for inherently not codable concepts without codable children: Secondary | ICD-10-CM | POA: Diagnosis not present

## 2014-03-13 ENCOUNTER — Encounter: Payer: Self-pay | Admitting: Physical Therapy

## 2014-03-18 DIAGNOSIS — Z9289 Personal history of other medical treatment: Secondary | ICD-10-CM

## 2014-03-18 HISTORY — DX: Personal history of other medical treatment: Z92.89

## 2014-03-21 ENCOUNTER — Ambulatory Visit: Payer: Managed Care, Other (non HMO) | Attending: Orthopedic Surgery | Admitting: Physical Therapy

## 2014-03-21 DIAGNOSIS — M546 Pain in thoracic spine: Secondary | ICD-10-CM | POA: Diagnosis not present

## 2014-03-21 DIAGNOSIS — M542 Cervicalgia: Secondary | ICD-10-CM | POA: Diagnosis not present

## 2014-03-21 DIAGNOSIS — IMO0001 Reserved for inherently not codable concepts without codable children: Secondary | ICD-10-CM | POA: Insufficient documentation

## 2014-03-21 DIAGNOSIS — M545 Low back pain, unspecified: Secondary | ICD-10-CM | POA: Insufficient documentation

## 2014-03-21 DIAGNOSIS — M25519 Pain in unspecified shoulder: Secondary | ICD-10-CM | POA: Insufficient documentation

## 2014-03-21 DIAGNOSIS — K5090 Crohn's disease, unspecified, without complications: Secondary | ICD-10-CM | POA: Insufficient documentation

## 2014-03-21 DIAGNOSIS — R5381 Other malaise: Secondary | ICD-10-CM | POA: Insufficient documentation

## 2014-03-27 ENCOUNTER — Encounter: Payer: Self-pay | Admitting: *Deleted

## 2014-03-28 ENCOUNTER — Telehealth: Payer: Self-pay | Admitting: Neurology

## 2014-03-28 ENCOUNTER — Encounter: Payer: Self-pay | Admitting: Neurology

## 2014-03-28 ENCOUNTER — Ambulatory Visit (INDEPENDENT_AMBULATORY_CARE_PROVIDER_SITE_OTHER): Payer: Managed Care, Other (non HMO) | Admitting: Neurology

## 2014-03-28 VITALS — BP 132/85 | HR 76 | Ht 61.5 in | Wt 194.0 lb

## 2014-03-28 DIAGNOSIS — R569 Unspecified convulsions: Secondary | ICD-10-CM

## 2014-03-28 DIAGNOSIS — R51 Headache: Secondary | ICD-10-CM

## 2014-03-28 NOTE — Patient Instructions (Signed)
Overall you are doing fairly well but I do want to suggest a few things today:   Remember to drink plenty of fluid, eat healthy meals and do not skip any meals. Try to eat protein with a every meal and eat a healthy snack such as fruit or nuts in between meals. Try to keep a regular sleep-wake schedule and try to exercise daily, particularly in the form of walking, 20-30 minutes a day, if you can.   As far as diagnostic testing:  1)Please schedule an EEG when you check out 2)I would like you to have a MRI of the brain, you will be called to schedule this  Follow up once workup completed. Please call us with any interim questions, concerns, problems, updates or refill requests.   Please also call us for any test results so we can go over those with you on the phone.  My clinical assistant and will answer any of your questions and relay your messages to me and also relay most of my messages to you.   Our phone number is [phone]. We also have an after hours call service for urgent matters and there is a physician on-call for urgent questions. For any emergencies you know to call 911 or go to the nearest emergency room

## 2014-03-28 NOTE — Progress Notes (Signed)
[facility]    Provider:  Dr Janann Colonel Referring Provider: Chipper Herb, MD Primary Care Physician:  Emelda Fear, DO  CC:  headache  HPI:  Kathryn Olson is a 53 y.o. female here as a referral from Dr. Laurance Flatten for headache evaluation  Has had headaches for years, unsure how long she has had them. States that they are continuous and has been non-stop for years. Top of head feels like someone is hitting it with a Teacher, English as a foreign language. Gets to a >10/10, currently 10/10. Permanent nausea. +Photo and phonophobia. Notes some blurred vision that comes and goes. No transient obscuration. Currently taking tegretol 354m once daily, states this is for her headaches. [facility]   Notes some weakness in her hands and legs. Describes as proximal weakness in bilateral LE with some giveaway weakness in her RLE. She is currently being evaluated by ortho surgery for this concern.   States she has seizures in her sleep, she states she wakes up and the bed is wet and she has lost control of bowels. Notes being unclear as to what occurs during the events or how often she has them. Husband works nights, has never witnessed an event. Currently taking tegretol though unclear if this was started for seizures.   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   Review of Systems: Out of a complete 14 system review, the patient complains of only the following symptoms, and all other reviewed systems are negative. + blurred vision, eye pain, easy bruising, confusion, headaches, numbness, weakness, difficulty swallowing, insomnia, sleepiness, restless legs  History   Social History  . Marital Status: Married    Spouse Name: N/A    Number of Children: 2  . Years of Education: N/A   Occupational History  . plant, manual labor, lifting   . Disabled     Social History Main Topics  .  Smoking status: Former Smoker    Quit date: 01/01/1989  . Smokeless tobacco: Never Used     Comment: Quit a few years ago  . Alcohol Use: No     Comment: socially  . Drug Use: No  . Sexual Activity: Yes    Birth Control/ Protection: Surgical   Other Topics Concern  . Not on file   Social History Narrative   Patient is married with 2 children.   Patient is disabled.   Patient has 12+ years or schooling    Patient states that she can write with both hands.           Family History  Problem Relation Age of Onset  . Colon cancer Maternal Grandfather     ?less than age 9430s . Heart attack Father   . Alcohol abuse Father   . Diabetes Mother   . Hypertension Mother   . Dementia Mother   . Depression Mother   . Inflammatory bowel disease Neg Hx   . ADD / ADHD Neg Hx   . Drug abuse Neg Hx   . Paranoid behavior Neg Hx   . Schizophrenia Neg Hx   . Seizures Neg Hx   . Sexual abuse Neg Hx   . Physical abuse Neg Hx   . Colon polyps Neg Hx   . Liver disease Brother     does know details, but related to MVA?  .Marland KitchenAnxiety disorder Sister   . Bipolar disorder Sister   . OCD  Sister   . Anxiety disorder Sister   . Depression Sister   . Malignant hyperthermia Daughter   . Heart murmur Son     Past Medical History  Diagnosis Date  . Environmental allergies   . Anxiety   . Family history of anesthesia complication     MH during C Section  . 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 Complication of anesthesia   . H/O colonoscopy   . Migraine   . Crohn's disease June 2013  . IBS (irritable bowel syndrome) June 2014  . Obsessive-compulsive disorder   . PTSD (post-traumatic stress disorder)   . Seizures     Past Surgical History  Procedure Laterality Date  . Partial hysterectomy    . Tubal ligation    . Cesarean section      X2  . Bladder surgery      age 11  . Esophagogastroduodenoscopy      remote, had ulcers, [place], Dr.  Truddie Coco  . Colonoscopy  05/22/2012    SLF: Polyps, multiple hyperplastic in the sigmoid colon/Polyp in the rectum/  MODERATE Diverticulosis throughout the colon/ Internal hemorrhoids  . Esophagogastroduodenoscopy  05/22/2012    SLF: Stricture in the distal esophagus/Moderate gastritis    Current Outpatient Prescriptions  Medication Sig Dispense Refill  . ALPRAZolam (XANAX) 0.5 MG tablet Take 1 tablet (0.5 mg total) by mouth at bedtime as needed for anxiety.  30 tablet  0  . busPIRone (BUSPAR) 15 MG tablet Take 1 tablet (15 mg total) by mouth 4 (four) times daily.  120 tablet  1  . calcium carbonate (TUMS - DOSED IN MG ELEMENTAL CALCIUM) 500 MG chewable tablet Chew 1 tablet by mouth daily.      . Carbamazepine, Antipsychotic, (EQUETRO) 300 MG CP12 Take 1 capsule (300 mg total) by mouth at bedtime.  35 each  0  . dexlansoprazole (DEXILANT) 60 MG capsule Take 60 mg by mouth 2 (two) times daily.      Marland Kitchen docusate sodium (COLACE) 100 MG capsule Take 100 mg by mouth 2 (two) times daily.      Marland Kitchen EPINEPHrine (EPI-PEN) 0.3 mg/0.3 mL DEVI Inject 0.3 mg into the muscle as needed (for allergic reaction).       Marland Kitchen esomeprazole (NEXIUM) 40 MG capsule 1 PO 30 MINS PRIOR TO FIRST AND LAST MEAL  60 capsule  11  . Linaclotide (LINZESS) 290 MCG CAPS capsule 1 PO 30 MINS BEFORE BREAKFAST. IT MAY CAUSE DIARRHEA.  30 capsule  11  . ondansetron (ZOFRAN) 4 MG tablet Take 1 tablet (4 mg total) by mouth every 6 (six) hours as needed for nausea.  30 tablet  1  . traMADol (ULTRAM) 50 MG tablet Take by mouth every 6 (six) hours as needed.       No current facility-administered medications for this visit.    Allergies as of 03/28/2014 - Review Complete 03/28/2014  Allergen Reaction Noted  . Aspirin Anaphylaxis 11/17/2011  . Peanut-containing drug products Anaphylaxis 11/17/2011  . Penicillins Anaphylaxis 11/17/2011  . Sulfa antibiotics Anaphylaxis 11/17/2011  . Codeine Itching 11/17/2011  . Rubbing alcohol [alcohol] Rash  11/17/2011    Vitals: BP 132/85  Pulse 76  Ht 5' 1.5" (1.562 m)  Wt 194 lb (87.998 kg)  BMI 36.07 kg/m2 Last Weight:  Wt Readings from Last 1 Encounters:  03/28/14 194 lb (87.998 kg)   Last Height:   Ht Readings from Last 1 Encounters:  03/28/14 5' 1.5" (1.562 m)     Physical exam: Exam: Gen: NAD, conversant, reports 10/10 pain but appears comfortable Eyes: anicteric sclerae, moist conjunctivae HENT: Atraumatic, oropharynx clear Neck: Trachea midline; supple,  Lungs: CTA, no wheezing, rales, rhonic                          CV: RRR, no MRG Abdomen: Soft, non-tender;  Extremities: No peripheral edema  Skin: Normal temperature, no rash,  Psych: Appropriate affect, pleasant  Neuro: MS: AA&Ox3, appropriately interactive, normal affect   Speech: fluent w/o paraphasic error  Memory: good recent and remote recall  CN: PERRL, VFF to FC bilaterally, EOMI no nystagmus, fundoscopic exam wnl bilat, no ptosis, sensation intact to LT V1-V3 bilat, face symmetric, no weakness, hearing grossly intact, palate elevates symmetrically, shoulder shrug 5/5 bilat,  tongue protrudes midline, no fasiculations noted.  Motor: normal bulk and tone Strength: 5/5  In all extremities some giveaway weakness noted in bilateral LE proximally  Coord: rapid alternating and point-to-point (FNF, HTS) movements intact.  Reflexes: symmetrical, bilat downgoing toes  Sens: LT intact in all extremities  Gait: posture, stance, stride and arm-swing normal. Tandem gait intact. Able to walk on heels and toes. Romberg absent.   Assessment:  After physical and neurologic examination, review of laboratory studies, imaging, neurophysiology testing and pre-existing records, assessment will be reviewed on the problem list.  Plan:  Treatment plan and additional workup will be reviewed under Problem List.  1)Headache 2)Seizures  53y/o woman presenting for initial evaluation of chronic unremitting headache for  multiple years. Unclear etiology of her headache. Patient does note history of blurred vision but history and exam not consistent with a diagnosis of IIH. Due to chronic severe nature will check MRI brain. Unclear nature of seizure disorder as they have never been witnessed. Will check EEG. Depending on results would consider starting Topamax 61m BID (if no hx of glaucoma or renal stones) which would theoretically treat both headache and seizure disorder. Follow up once workup completed.   PJim Like DO  [facility] [address]SWallowaGMarathon Neligh [id number] Phone [phone] [phone]

## 2014-04-03 ENCOUNTER — Ambulatory Visit (INDEPENDENT_AMBULATORY_CARE_PROVIDER_SITE_OTHER): Payer: Managed Care, Other (non HMO) | Admitting: Radiology

## 2014-04-03 DIAGNOSIS — R569 Unspecified convulsions: Secondary | ICD-10-CM

## 2014-04-03 DIAGNOSIS — R51 Headache: Secondary | ICD-10-CM

## 2014-04-03 NOTE — Procedures (Signed)
    History:  Kathryn Olson is a 53 year old patient with a history of chronic headaches. The patient has a history of seizures coming out of sleep, and she will wake up with urinary and bowel incontinence. The patient being evaluated for possible seizures.  This is a routine EEG. No skull defects are noted. Medications include alprazolam, BuSpar, calcium, carbamazepine, Dexilant, Colace, Nexium, Linzess, Zofran, and Ultram.   EEG classification: Normal awake and asleep  Description of the recording: The background rhythms of this recording consists of a fairly well modulated medium amplitude background activity of 9 Hz. As the record progresses, the patient initially is in the waking state, but appears to enter the early stage II sleep during the recording, with rudimentary sleep spindles and vertex sharp wave activity seen. During the wakeful state, photic stimulation is performed, and this results in a bilateral and symmetric photic driving response. Hyperventilation was also performed, and this results in a minimal buildup of the background rhythm activities without significant slowing seen. At no time during the recording does there appear to be evidence of spike or spike wave discharges or evidence of focal slowing. Throughout the recording, overlying beta frequency activity seen in a generalized fashion. EKG monitor shows no evidence of cardiac rhythm abnormalities with a heart rate of 78.  Impression: This is a normal EEG recording in the waking and sleeping state. No evidence of ictal or interictal discharges were seen at any time during the recording. The beta frequency activity seen throughout the recording is likely a medication effect from a benzodiazepine such as alprazolam.

## 2014-04-06 ENCOUNTER — Emergency Department (HOSPITAL_COMMUNITY)
Admission: EM | Admit: 2014-04-06 | Discharge: 2014-04-07 | Disposition: A | Discharge status: Home / Self Care | Payer: Managed Care, Other (non HMO) | Attending: Emergency Medicine | Admitting: Emergency Medicine

## 2014-04-06 ENCOUNTER — Encounter (HOSPITAL_COMMUNITY): Payer: Self-pay | Admitting: Emergency Medicine

## 2014-04-06 DIAGNOSIS — T628X1A Toxic effect of other specified noxious substances eaten as food, accidental (unintentional), initial encounter: Secondary | ICD-10-CM | POA: Insufficient documentation

## 2014-04-06 DIAGNOSIS — L299 Pruritus, unspecified: Secondary | ICD-10-CM | POA: Insufficient documentation

## 2014-04-06 DIAGNOSIS — Z8719 Personal history of other diseases of the digestive system: Secondary | ICD-10-CM | POA: Insufficient documentation

## 2014-04-06 DIAGNOSIS — Y9289 Other specified places as the place of occurrence of the external cause: Secondary | ICD-10-CM | POA: Insufficient documentation

## 2014-04-06 DIAGNOSIS — G8929 Other chronic pain: Secondary | ICD-10-CM | POA: Insufficient documentation

## 2014-04-06 DIAGNOSIS — Z87891 Personal history of nicotine dependence: Secondary | ICD-10-CM | POA: Insufficient documentation

## 2014-04-06 DIAGNOSIS — Z880 Allergy status to penicillin: Secondary | ICD-10-CM | POA: Insufficient documentation

## 2014-04-06 DIAGNOSIS — Z8679 Personal history of other diseases of the circulatory system: Secondary | ICD-10-CM | POA: Insufficient documentation

## 2014-04-06 DIAGNOSIS — G40909 Epilepsy, unspecified, not intractable, without status epilepticus: Secondary | ICD-10-CM | POA: Insufficient documentation

## 2014-04-06 DIAGNOSIS — Y9389 Activity, other specified: Secondary | ICD-10-CM | POA: Insufficient documentation

## 2014-04-06 DIAGNOSIS — F411 Generalized anxiety disorder: Secondary | ICD-10-CM | POA: Insufficient documentation

## 2014-04-06 DIAGNOSIS — T7840XA Allergy, unspecified, initial encounter: Secondary | ICD-10-CM

## 2014-04-06 HISTORY — DX: Other chronic pain: G89.29

## 2014-04-06 HISTORY — DX: Headache: R51

## 2014-04-06 HISTORY — DX: Headache, unspecified: R51.9

## 2014-04-06 HISTORY — DX: Unspecified abdominal pain: R10.9

## 2014-04-06 HISTORY — DX: Personal history of other medical treatment: Z92.89

## 2014-04-06 MED ORDER — DIPHENHYDRAMINE HCL 50 MG/ML IJ SOLN
50.0000 mg | Freq: Once | INTRAMUSCULAR | Status: AC
  Administered 2014-04-06: 50 mg via INTRAVENOUS
  Filled 2014-04-06: qty 1

## 2014-04-06 MED ORDER — ONDANSETRON HCL 4 MG/2ML IJ SOLN
4.0000 mg | INTRAMUSCULAR | Status: AC | PRN
  Administered 2014-04-06 – 2014-04-07 (×2): 4 mg via INTRAVENOUS
  Filled 2014-04-06 (×2): qty 2

## 2014-04-06 MED ORDER — FAMOTIDINE IN NACL 20-0.9 MG/50ML-% IV SOLN
20.0000 mg | Freq: Once | INTRAVENOUS | Status: AC
  Administered 2014-04-06: 20 mg via INTRAVENOUS
  Filled 2014-04-06: qty 50

## 2014-04-06 MED ORDER — METHYLPREDNISOLONE SODIUM SUCC 125 MG IJ SOLR
125.0000 mg | Freq: Once | INTRAMUSCULAR | Status: AC
  Administered 2014-04-06: 125 mg via INTRAVENOUS
  Filled 2014-04-06: qty 2

## 2014-04-06 NOTE — ED Provider Notes (Signed)
CSN: [id number]     Arrival date & time 04/06/14  2254 History   First MD Initiated Contact with Patient 04/06/14 2257     Chief Complaint  Patient presents with  . Allergic Reaction      HPI Pt was seen at 2300.  Per pt and her spouse, c/o gradual onset and persistence of constant "allergic reaction to peanut oil" that began 3 hours PTA. Pt states they ate at a local fast food restaurant where the food was cooked in peanut oil. Pt c/o N/V and her "back itching." Pt states this is consistent with her usual allergic reaction to peanut oil. Pt states she previously had a epi pen but "it's expired." Pt requesting refill of same. Pt did not take any meds PTA. Pt denies SOB/wheezing, no dysphagia, no intra-oral edema, no drooling/stridor.    Past Medical History  Diagnosis Date  . Environmental allergies   . Anxiety   . Family history of anesthesia complication     MH during C Section  . 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 Complication of anesthesia   . H/O colonoscopy   . Migraine   . Crohn's disease June 2013  . IBS (irritable bowel syndrome) June 2014  . Obsessive-compulsive disorder   . PTSD (post-traumatic stress disorder)   . Seizures   . Chronic abdominal pain   . Chronic headaches   . History of electroencephalogram 03/2014    normal   Past Surgical History  Procedure Laterality Date  . Partial hysterectomy    . Tubal ligation    . Cesarean section      X2  . Bladder surgery      age 53  . Esophagogastroduodenoscopy      remote, had ulcers, [place], Dr. Truddie Coco  . Colonoscopy  05/22/2012    SLF: Polyps, multiple hyperplastic in the sigmoid colon/Polyp in the rectum/  MODERATE Diverticulosis throughout the colon/ Internal hemorrhoids  . Esophagogastroduodenoscopy  05/22/2012    SLF: Stricture in the distal esophagus/Moderate gastritis   Family History  Problem Relation Age of Onset  . Colon cancer Maternal Grandfather      ?less than age 48s  . Heart attack Father   . Alcohol abuse Father   . Diabetes Mother   . Hypertension Mother   . Dementia Mother   . Depression Mother   . Inflammatory bowel disease Neg Hx   . ADD / ADHD Neg Hx   . Drug abuse Neg Hx   . Paranoid behavior Neg Hx   . Schizophrenia Neg Hx   . Seizures Neg Hx   . Sexual abuse Neg Hx   . Physical abuse Neg Hx   . Colon polyps Neg Hx   . Liver disease Brother     does know details, but related to MVA?  Marland Kitchen Anxiety disorder Sister   . Bipolar disorder Sister   . OCD Sister   . Anxiety disorder Sister   . Depression Sister   . Malignant hyperthermia Daughter   . Heart murmur Son    History  Substance Use Topics  . Smoking status: Former Smoker    Quit date: 01/01/1989  . Smokeless tobacco: Never Used     Comment: Quit a few years ago  . Alcohol Use: No     Comment: socially    Review of Systems ROS: Statement: All systems negative except as marked or noted in the HPI; Constitutional:  Negative for fever and chills. ; ; Eyes: Negative for eye pain, redness and discharge. ; ; ENMT: Negative for ear pain, hoarseness, nasal congestion, sinus pressure and sore throat. ; ; Cardiovascular: Negative for chest pain, palpitations, diaphoresis, dyspnea and peripheral edema. ; ; Respiratory: Negative for cough, wheezing and stridor. ; ; Gastrointestinal: +N/V. Negative for diarrhea, abdominal pain, blood in stool, hematemesis, jaundice and rectal bleeding. . ; ; Genitourinary: Negative for dysuria, flank pain and hematuria. ; ; Musculoskeletal: Negative for back pain and neck pain. Negative for swelling and trauma.; ; Skin: +pruritis. Negative for rash, abrasions, blisters, bruising and skin lesion.; ; Neuro: Negative for headache, lightheadedness and neck stiffness. Negative for weakness, altered level of consciousness , altered mental status, extremity weakness, paresthesias, involuntary movement, seizure and syncope.      Allergies  Aspirin;  Peanut-containing drug products; Penicillins; Sulfa antibiotics; Codeine; and Rubbing alcohol  Home Medications   Prior to Admission medications   Medication Sig Start Date End Date Taking? Authorizing Provider  ALPRAZolam Duanne Moron) 0.5 MG tablet Take 1 tablet (0.5 mg total) by mouth at bedtime as needed for anxiety. 09/04/13   Mary-Margaret Hassell Done, FNP  busPIRone (BUSPAR) 15 MG tablet Take 1 tablet (15 mg total) by mouth 4 (four) times daily. 09/04/13   Mary-Margaret Hassell Done, FNP  calcium carbonate (TUMS - DOSED IN MG ELEMENTAL CALCIUM) 500 MG chewable tablet Chew 1 tablet by mouth daily.    Historical Provider, MD  Carbamazepine, Antipsychotic, (EQUETRO) 300 MG CP12 Take 1 capsule (300 mg total) by mouth at bedtime. 01/29/13   Darrol Jump, MD  dexlansoprazole (DEXILANT) 60 MG capsule Take 60 mg by mouth 2 (two) times daily.    Historical Provider, MD  docusate sodium (COLACE) 100 MG capsule Take 100 mg by mouth 2 (two) times daily.    Historical Provider, MD  EPINEPHrine (EPI-PEN) 0.3 mg/0.3 mL DEVI Inject 0.3 mg into the muscle as needed (for allergic reaction).     Historical Provider, MD  esomeprazole (NEXIUM) 40 MG capsule 1 PO 30 MINS PRIOR TO FIRST AND LAST MEAL 02/21/14   Danie Binder, MD  Linaclotide (LINZESS) 290 MCG CAPS capsule 1 PO 30 MINS BEFORE BREAKFAST. IT MAY CAUSE DIARRHEA. 02/21/14   Danie Binder, MD  ondansetron (ZOFRAN) 4 MG tablet Take 1 tablet (4 mg total) by mouth every 6 (six) hours as needed for nausea. 02/21/14   Danie Binder, MD  traMADol (ULTRAM) 50 MG tablet Take by mouth every 6 (six) hours as needed.    Historical Provider, MD   BP 133/79  Pulse 68  Temp(Src) 98.4 F (36.9 C) (Oral)  Resp 16  SpO2 96% Physical Exam 2305: Physical examination:  Nursing notes reviewed; Vital signs and O2 SAT reviewed;  Constitutional: Well developed, Well nourished, Well hydrated, Tearful.; Head:  Normocephalic, atraumatic; Eyes: EOMI, PERRL, No scleral icterus; ENMT: Mouth  and pharynx normal, Mucous membranes moist. Mouth and pharynx without lesions. No tonsillar exudates. No intra-oral edema. No submandibular or sublingual edema. No hoarse voice, no drooling, no stridor. No trismus.; Neck: Supple, Full range of motion, No lymphadenopathy; Cardiovascular: Tachycardic rate and rhythm, No gallop; Respiratory: Breath sounds clear & equal bilaterally, No wheezes.  Speaking full sentences with ease, Normal respiratory effort/excursion; Chest: Nontender, Movement normal; Abdomen: +vomiting during exam. Soft, Nontender, Nondistended, Normal bowel sounds; Genitourinary: No CVA tenderness; Extremities: Pulses normal, No tenderness, No edema, No calf edema or asymmetry.; Neuro: AA&Ox3, Major CN grossly intact.  Speech clear. No  gross focal motor or sensory deficits in extremities.; Skin: Color normal, Warm, Dry. No hives.; Psych:  Anxious, tearful.   ED Course  Procedures     EKG Interpretation   Date/Time:  Saturday April 06 2014 22:56:48 EDT Ventricular Rate:  115 PR Interval:  180 QRS Duration: 79 QT Interval:  328 QTC Calculation: 370 R Axis:   46 Text Interpretation:  Sinus tachycardia Baseline wander When compared with  ECG of 04/28/2013 Rate faster Otherwise no significant change Confirmed by  [facility]  MD, Nunzio Cory [phone]) on 04/06/2014 11:43:29 PM      MDM  MDM Reviewed: previous chart, nursing note and vitals Reviewed previous: ECG Interpretation: ECG     0330:  After 4+ hours observation, pt continues to feel "better" and wants to go home now. No further N/V. No hives, no SOB. VS remain stable. Pt requested a dose of her usual chronic abd pain med (ultram) while in the ED. Dx and testing d/w pt and family.  Questions answered.  Verb understanding, agreeable to d/c home with outpt f/u.    Alfonzo Feller, DO 04/09/14 1819

## 2014-04-06 NOTE — ED Notes (Signed)
Pt presents with c/o allergic reaction to peanut oil. Per pt's husband, pt had some fish at a cookout and it was cooked in peanut oil. Pt reports abdominal pain and tongue burning at this time. Pt says that she is not having any shortness of breath or throat swelling. Pt is alert and oriented. Pt's voice is somewhat muffled but she can answer questions in complete sentences.

## 2014-04-07 ENCOUNTER — Encounter (HOSPITAL_COMMUNITY): Payer: Self-pay | Admitting: Emergency Medicine

## 2014-04-07 MED ORDER — EPINEPHRINE 0.3 MG/0.3ML IJ SOAJ
0.3000 mg | INTRAMUSCULAR | Status: DC | PRN
Start: 2014-04-07 — End: 2014-07-28

## 2014-04-07 MED ORDER — PREDNISONE 20 MG PO TABS: 40.0000 mg | ORAL_TABLET | Freq: Every day | ORAL | Status: DC

## 2014-04-07 MED ORDER — TRAMADOL HCL 50 MG PO TABS
50.0000 mg | ORAL_TABLET | Freq: Once | ORAL | Status: AC
  Administered 2014-04-07: 50 mg via ORAL
  Filled 2014-04-07: qty 1

## 2014-04-07 NOTE — Discharge Instructions (Signed)
Emergency Department Resource Guide 1) Find a Doctor and Pay Out of Pocket Although you won't have to find out who is covered by your insurance plan, it is a good idea to ask around and get recommendations. You will then need to call the office and see if the doctor you have chosen will accept you as a new patient and what types of options they offer for patients who are self-pay. Some doctors offer discounts or will set up payment plans for their patients who do not have insurance, but you will need to ask so you aren't surprised when you get to your appointment.  2) Contact Your Local Health Department Not all health departments have doctors that can see patients for sick visits, but many do, so it is worth a call to see if yours does. If you don't know where your local health department is, you can check in your phone book. The CDC also has a tool to help you locate your state's health department, and many state websites also have listings of all of their local health departments.  3) Find a [facility] If your illness is not likely to be very severe or complicated, you may want to try a walk in clinic. These are popping up all over the country in pharmacies, drugstores, and shopping centers. They're usually staffed by nurse practitioners or physician assistants that have been trained to treat common illnesses and complaints. They're usually fairly quick and inexpensive. However, if you have serious medical issues or chronic medical problems, these are probably not your best option.  No Primary Care Doctor: - Call Health Connect at  [phone] - they can help you locate a primary care doctor that  accepts your insurance, provides certain services, etc. - Physician Referral Service- [phone]  Chronic Pain Problems: Organization         Address  Phone   Notes  [facility]  [phone] Patients need to be referred by their primary care doctor.   Medication  Assistance: Organization         Address  Phone   Notes  [facility] Medication [facility] [address] [place], [place] [address] [phone] --Must be a resident of [facility] -- Must have NO insurance coverage whatsoever (no Medicaid/ Medicare, etc.) -- The pt. MUST have a primary care doctor that directs their care regularly and follows them in the community   [facility]ist  [phone]   [employer]  [phone]    Agencies that provide inexpensive medical care: Organization         Address  Phone   Notes  [place]  [phone]   [facility]    [phone]   [facility] [place], [place] [address] [phone]   [place] [address], [place] [phone]   Planned Parenthood    [phone]   [facility]    [phone]   New Home and [address], [place] Phone:  [phone], Fax:  [phone] Hours of Operation:  9 am - 6 pm, M-F.  Also accepts Medicaid/Medicare and self-pay.  [facility] for [place], [address], [place] Phone: [phone], Fax: [phone]. Hours of Operation:  8:30 am - 5:30 pm, M-F.  Also accepts Medicaid and self-pay.  [facility] [address], [place] Phone: [phone]   [place], [place], [place] [phone], Ext. [phone] Mondays & Thursdays: 7-9 AM.  First 15 patients are seen on a first come, first serve basis.    [place] Providers:  Organization         Address  Phone   Notes  [facility] [address], [place] [phone] Also accepts self-pay patients.  [facility] [address], [place]  [phone]   [place], [address], [place]  [phone]   [facility] [address], [place] [phone]   Lucianne Lei [address], [place]   [phone] Only accepts [place] Access [place] patients after they have their name applied to their card.   Self-Pay (no insurance) in [facility] [place]:  Organization         Address  Phone   Notes  Sickle Cell Patients, [facility] Internal Medicine [place] [phone]   [facility] [place] [phone]   [facility] [place]  [place], [place], [place] [phone]   [facility]/Dr. Osei-Bonsu  [address] [place] or [address], [place] [phone] Phone number for both [place] and [place] locations is the same.  Urgent Medical and [facility] [address] [place] [phone]   [facility] [address], [place] or [address] [phone]   [facility] [address] [place] [phone], phone; [phone], fax Sees patients 1st and 3rd Saturday of every month.  Must not qualify for public or private insurance (i.e. Medicaid, Medicare, [place] Health Choice, Veterans' Benefits)  Household income should be no more than 200% of the poverty level The clinic cannot treat you if you are pregnant or think you are pregnant  Sexually transmitted diseases are not treated at the clinic.    Dental Care: Organization         Address  Phone  Notes  [facility] Department of [facility] [place] [phone] Accepts children up to age 16 who are enrolled in [place] or [place]; pregnant women with a Medicaid card; and children who have applied for Medicaid or [place] Health Choice, but were declined, whose parents can pay a reduced fee at time of service.  [facility]  Department of [facility]  [address], [place] [phone] Accepts children up to age 29 who are enrolled in [place] or [place]; pregnant women with a Medicaid card; and children who have applied for Medicaid or  Health Choice, but were declined, whose parents can pay a reduced fee at time of service.  [address] Adult Dental Access PROGRAM  [place] [phone] Patients are seen by appointment only. Walk-ins are not accepted. [place] will see patients 42 years of age and older. Monday - Tuesday (8am-5pm) Most Wednesdays (8:30-5pm) $30 per visit, cash only  [facility] Adult Dental Access PROGRAM  [address], [facility] [phone] Patients are seen by appointment only. Walk-ins are not accepted. [place] will see patients 76 years of age and older. One  Wednesday Evening (Monthly: Volunteer Based).  $30 per visit, cash only  [place]  [phone] for adults; Children under age 21, call Graduate Pediatric Dentistry at [phone]. Children aged 47-14, please call [phone] to request a pediatric application.  Dental services are provided in all areas of dental care including fillings, crowns and bridges, complete and partial dentures, implants, gum treatment, root canals, and extractions. Preventive care is also provided. Treatment is provided to both adults and children. Patients are selected via a lottery and there is often a waiting list.   [facility] [address] [place]  [phone] [email or web address]   [facility] [address] [place], [place] [phone], Ext. [phone] Second and Fourth Thursday of each month, opens at 6:30 AM; Clinic ends at 9 AM.  Patients are seen on a first-come first-served basis, and a limited number are seen during each clinic.   [facility]  [address] Hillard Danker [place], [place] [phone]    Eligibility Requirements You must have lived in [place], [place], or [place] counties for at least the last three months.   You cannot be eligible for state or federal sponsored [employer], including [employer], [place], or [employer].   You generally cannot be eligible for healthcare insurance through your employer.    How to apply: Eligibility screenings are held every Tuesday and Wednesday afternoon from 1:00 pm until 4:00 pm. You do not need an appointment for the interview!  [facility] [address], [place], [place]   [place]  [place] Department  [place]  [phone]    Behavioral Health Resources in the Community: Intensive Outpatient Programs Organization         Address  Phone  Notes  [place]. [address] [place], [place] [phone]   [facility]patient [address], [place], [place]   ADS: Alcohol & Drug Svcs [address], [place], [place]   [place] [address]  [place], [place] or [phone]   Substance Abuse Resources Organization         Address  Phone  Notes  Alcohol and Drug Services  [phone]   [place]  [phone]   [facility] [place]   Chinita Pester  [phone]   Residential & Outpatient Substance Abuse Program  [phone]   Psychological Services Organization         Address  Phone  Notes  [facility]  [place]  [phone]   [place] [address], [place] or [phone]    Mobile Crisis Teams Organization         Address  Phone  Notes  [facility], Mobile Crisis Care Unit  [phone]   [facility] Psychotherapeutic Services  [address]  [place], [place]   [place] Levels [address] [place] [phone]    Self-Help/Support Groups Organization         Address  Phone             Notes  [place]. of [place] - variety of support groups  [place] Call for more information  Narcotics Anonymous (NA), [facility] [address], [employer]   2 meetings at this location  Residential Treatment Programs Organization         Address  Phone  Notes  [facility] [address]    [place]  [phone]   [facility]  [address] [place] [address], [place], [place]   [place], [place] [phone] Admissions: 8am-3pm M-F  Incentives Substance [place] [address],    [place], [place] [phone]   [facility] [address] [place], [place], [place]   [facility] [address],  [place], [place]   [facility] Programs - Intensive Outpatient [address], [place], [place]   [facility] [address].,  [place], [place] [phone] or [phone]   [facility]idential Treatment Services (RTS) [address], [place], [place] Accepts Medicaid  [facility] [place] [address]  [place] [phone] Substance Abuse/Addiction Treatment   [facility] Organization         Address  Phone  Notes  [facility]  [phone]   Domenic Schwab, PhD [address] Arlis Porta [place], [place]   [phone] or [phone]   [facility] [place], [place] [phone]   [facility] [address], [place], [place] [phone] Insurance/Medicaid/sponsorship through [facility] and Families [address], [address]                                    [place], [place] [phone] [place] [address], [place] [phone]    Dr. Adele Schilder  [phone]   [facility] Dept. 1) [address] [place] 2) [place] 3)  [place] [address], [place] [phone]  [phone]   [place] [phone] or [phone] (After Hours)       Take the prescription as directed.  Take over the counter benadryl, as directed on packaging, as needed for rash or itching.  If the benadryl is too sedating, take an over the counter non-sedating antihistamine such as claritin, allegra or zyrtec, as directed on packaging.  Call your regular medical doctor on Monday to schedule a follow up appointment within the next 2 days.  Return to the Emergency Department immediately sooner if worsening.

## 2014-04-12 ENCOUNTER — Ambulatory Visit
Admission: RE | Admit: 2014-04-12 | Discharge: 2014-04-12 | Disposition: A | Discharge status: Home / Self Care | Payer: Managed Care, Other (non HMO) | Source: Ambulatory Visit | Attending: Neurology | Admitting: Neurology

## 2014-04-12 DIAGNOSIS — R51 Headache: Secondary | ICD-10-CM

## 2014-04-12 DIAGNOSIS — R569 Unspecified convulsions: Secondary | ICD-10-CM

## 2014-04-16 ENCOUNTER — Other Ambulatory Visit: Payer: Self-pay | Admitting: Neurology

## 2014-04-16 MED ORDER — TOPIRAMATE 25 MG PO TABS: 25.0000 mg | ORAL_TABLET | Freq: Two times a day (BID) | ORAL | Status: DC

## 2014-04-17 NOTE — Progress Notes (Signed)
Quick Note:  I called and relayed the MRI, EEG results to pt. Also relayed the medication prescribed topamax 40m po bid. She did not have kidney stones or glaucoma. She verbalized understanding. Forwarded note to clinic asst for appt with CCecille Rubin NP ______

## 2014-04-18 ENCOUNTER — Telehealth: Payer: Self-pay | Admitting: *Deleted

## 2014-04-18 NOTE — Progress Notes (Signed)
Quick Note:  Left message for patient to return the call to schedule appointment with NP CM per Dr Janann Colonel. ______

## 2014-04-18 NOTE — Telephone Encounter (Signed)
Left message for patient to call back to schedule appointment with NP CM per Dr. Janann Colonel.

## 2014-05-27 ENCOUNTER — Encounter: Payer: Self-pay | Admitting: Gastroenterology

## 2014-07-04 ENCOUNTER — Ambulatory Visit (INDEPENDENT_AMBULATORY_CARE_PROVIDER_SITE_OTHER): Payer: Managed Care, Other (non HMO) | Admitting: Gastroenterology

## 2014-07-04 ENCOUNTER — Encounter: Payer: Self-pay | Admitting: Gastroenterology

## 2014-07-04 VITALS — BP 136/86 | HR 61 | Temp 97.1°F | Ht 62.0 in

## 2014-07-04 DIAGNOSIS — K589 Irritable bowel syndrome without diarrhea: Secondary | ICD-10-CM

## 2014-07-04 DIAGNOSIS — K219 Gastro-esophageal reflux disease without esophagitis: Secondary | ICD-10-CM

## 2014-07-04 MED ORDER — LINACLOTIDE 290 MCG PO CAPS: ORAL_CAPSULE | ORAL | Status: DC

## 2014-07-04 NOTE — Progress Notes (Signed)
nic'd

## 2014-07-04 NOTE — Assessment & Plan Note (Signed)
SX CONTROLLED.  CONTINUE NEXIUM. TAKE 30 MINUTES BEFORE MEALS TWICE DAILY. CONTINUE YOUR WEIGHT LOSS EFFORTS. LOW FAT DIET AVOID TRIGGERS OPV IN 6 MOS

## 2014-07-04 NOTE — Assessment & Plan Note (Signed)
SX FAIRLY WELL CONTROLLED.  TAKE LINZESS WITH MEALS. [place]. OPV IN 6 MOS

## 2014-07-04 NOTE — Patient Instructions (Addendum)
TAKE LINZESS WITH MEALS.  [place].  DRINK WATER TO KEEP YOUR URINE LIGHT YELLOW.  FOLLW A HIGH FIBER/LOW FAT DIET.  CONTINUE YOUR WEIGHT LOSS EFFORTS.  FOLLOW UP IN 6 MOS.

## 2014-07-04 NOTE — Progress Notes (Signed)
Subjective:    Patient ID: Kathryn Olson, female    DOB: 1961/03/16, 53 y.o.   MRN: [id number]  [place], DO  HPI MEDICINES SHE'S ON IS DOING GOOD. BOWELS BE HARD BUT SOMETIMES SOFT. sTILL HAS LEFT SIDED ABD PAIN. TAKING LINZESS BEFORE MEALS AND MIRALAX(BID) AND COLACE BID. NIGHT SWEATS. DRINKING WATER. HAVING PROBLEM WITH HAs AND CONGESTION. OCCASIONAL NAUSEA: DAILY USES MEDS. OCCASIONAL DIARRHEA CHEST PAIN: WHEN SHE LAYS DOWN AT NIGHT(ALL THE TIME). WALKING WITH A WALKER. HANDS AND FEET SWELL.  PT DENIES FEVER, CHILLS, HEMATOCHEZIA, HEMATEMESIS, Vomiting, melena,  SHORTNESS OF BREATH,  problems swallowing, or heartburn or indigestion.  Past Medical History  Diagnosis Date  . Environmental allergies   . Anxiety   . Family history of anesthesia complication     MH during C Section  . 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 Complication of anesthesia   . H/O colonoscopy   . Migraine   . Crohn's disease June 2013  . IBS (irritable bowel syndrome) June 2014  . Obsessive-compulsive disorder   . PTSD (post-traumatic stress disorder)   . Seizures   . Chronic abdominal pain   . Chronic headaches   . History of electroencephalogram 03/2014    normal   Past Surgical History  Procedure Laterality Date  . Partial hysterectomy    . Tubal ligation    . Cesarean section      X2  . Bladder surgery      age 66  . Esophagogastroduodenoscopy      remote, had ulcers, [place], Dr. Truddie Coco  . Colonoscopy  05/22/2012    SLF: Polyps, multiple hyperplastic in the sigmoid colon/Polyp in the rectum/  MODERATE Diverticulosis throughout the colon/ Internal hemorrhoids  . Esophagogastroduodenoscopy  05/22/2012    SLF: Stricture in the distal esophagus/Moderate gastritis    Allergies  Allergen Reactions  . Aspirin Anaphylaxis  . Peanut-Containing Drug Products Anaphylaxis       . Penicillins Anaphylaxis    Stops her heart  . Sulfa Antibiotics  Anaphylaxis  . Codeine Itching  . Rubbing Alcohol [Alcohol] Rash   Current Outpatient Prescriptions  Medication Sig Dispense Refill  . busPIRone (BUSPAR) 15 MG tablet Take 1 tablet (15 mg total) by mouth 4 (four) times daily.    . Carbamazepine (EQUETRO) 300 MG CP12 Take 600 mg by mouth at bedtime.    . docusate sodium (COLACE) 100 MG capsule Take 100 mg by mouth 2 (two) times daily.    Marland Kitchen EPINEPHrine (EPI-PEN) 0.3 mg/0.3 mL DEVI Inject 0.3 mg into the muscle as needed (for allergic reaction).     Marland Kitchen esomeprazole (NEXIUM) 40 MG capsule Take 40 mg by mouth bid. BEFORE BREAKFAST AND LUNCH   . Linaclotide (LINZESS) 290 MCG CAPS capsule Take 290 mcg by mouth daily.     . ondansetron (ZOFRAN) 4 MG tablet Take 1 tablet (4 mg total) by mouth every 6 (six) hours as needed for nausea.    Marland Kitchen topiramate (TOPAMAX) 25 MG tablet Take 1 tablet (25 mg total) by mouth 2 (two) times daily.    . traMADol (ULTRAM) 50 MG tablet Take 50 mg by mouth every 6 (six) hours as needed for moderate pain.     Marland Kitchen ALPRAZolam (XANAX) 0.5 MG tablet Take 1 tablet (0.5 mg total) by mouth at bedtime as needed for anxiety.    .      . EPINEPHrine (EPIPEN) 0.3 mg/0.3 mL IJ SOAJ  injection Inject 0.3 mLs (0.3 mg total) into the muscle as needed (for allergic reaction).    .         Review of Systems     Objective:   Physical Exam  Vitals reviewed. Constitutional: She is oriented to person, place, and time. She appears well-developed and well-nourished. No distress.  HENT:  Head: Normocephalic and atraumatic.  Mouth/Throat: Oropharynx is clear and moist. No oropharyngeal exudate.  TONGUE AND EYEBROW PIERCED.  Eyes: Pupils are equal, round, and reactive to light. No scleral icterus.  Neck: Normal range of motion. Neck supple.  Cardiovascular: Normal rate, regular rhythm and normal heart sounds.   Pulmonary/Chest: Effort normal and breath sounds normal. No respiratory distress.  Abdominal: Soft. Bowel sounds are normal. She  exhibits no distension. There is no tenderness.  Genitourinary:  HAS VAGINA PIERCED.  Musculoskeletal: She exhibits no edema.  WALKS ASSISTED WITH A CANE.   Lymphadenopathy:    She has no cervical adenopathy.  Neurological: She is alert and oriented to person, place, and time.  NO  NEW FOCAL DEFICITS   Psychiatric:  FLAT AFFECT, NL MOOD          Assessment & Plan:

## 2014-07-05 ENCOUNTER — Encounter (HOSPITAL_COMMUNITY): Payer: Self-pay | Admitting: Emergency Medicine

## 2014-07-05 ENCOUNTER — Emergency Department (HOSPITAL_COMMUNITY): Payer: Managed Care, Other (non HMO)

## 2014-07-05 ENCOUNTER — Emergency Department (HOSPITAL_COMMUNITY)
Admission: EM | Admit: 2014-07-05 | Discharge: 2014-07-05 | Disposition: A | Discharge status: Home / Self Care | Payer: Managed Care, Other (non HMO) | Attending: Emergency Medicine | Admitting: Emergency Medicine

## 2014-07-05 DIAGNOSIS — R1013 Epigastric pain: Secondary | ICD-10-CM | POA: Diagnosis not present

## 2014-07-05 DIAGNOSIS — Z9889 Other specified postprocedural states: Secondary | ICD-10-CM | POA: Insufficient documentation

## 2014-07-05 DIAGNOSIS — Z880 Allergy status to penicillin: Secondary | ICD-10-CM | POA: Insufficient documentation

## 2014-07-05 DIAGNOSIS — Z9851 Tubal ligation status: Secondary | ICD-10-CM | POA: Insufficient documentation

## 2014-07-05 DIAGNOSIS — G8929 Other chronic pain: Secondary | ICD-10-CM | POA: Diagnosis not present

## 2014-07-05 DIAGNOSIS — F411 Generalized anxiety disorder: Secondary | ICD-10-CM | POA: Insufficient documentation

## 2014-07-05 DIAGNOSIS — G40909 Epilepsy, unspecified, not intractable, without status epilepticus: Secondary | ICD-10-CM | POA: Insufficient documentation

## 2014-07-05 DIAGNOSIS — Z87891 Personal history of nicotine dependence: Secondary | ICD-10-CM | POA: Diagnosis not present

## 2014-07-05 DIAGNOSIS — K589 Irritable bowel syndrome without diarrhea: Secondary | ICD-10-CM | POA: Insufficient documentation

## 2014-07-05 DIAGNOSIS — IMO0002 Reserved for concepts with insufficient information to code with codable children: Secondary | ICD-10-CM | POA: Diagnosis not present

## 2014-07-05 DIAGNOSIS — R1084 Generalized abdominal pain: Secondary | ICD-10-CM | POA: Insufficient documentation

## 2014-07-05 DIAGNOSIS — Z9089 Acquired absence of other organs: Secondary | ICD-10-CM | POA: Diagnosis not present

## 2014-07-05 DIAGNOSIS — G43909 Migraine, unspecified, not intractable, without status migrainosus: Secondary | ICD-10-CM | POA: Diagnosis not present

## 2014-07-05 DIAGNOSIS — Z79899 Other long term (current) drug therapy: Secondary | ICD-10-CM | POA: Insufficient documentation

## 2014-07-05 LAB — CBC WITH DIFFERENTIAL/PLATELET
Basophils Absolute: 0 10*3/uL (ref 0.0–0.1)
Basophils Relative: 0 % (ref 0–1)
EOS ABS: 0.2 10*3/uL (ref 0.0–0.7)
EOS PCT: 3 % (ref 0–5)
HCT: 42.9 % (ref 36.0–46.0)
Hemoglobin: 14 g/dL (ref 12.0–15.0)
LYMPHS ABS: 3.1 10*3/uL (ref 0.7–4.0)
Lymphocytes Relative: 46 % (ref 12–46)
MCH: 29 pg (ref 26.0–34.0)
MCHC: 32.6 g/dL (ref 30.0–36.0)
MCV: 88.8 fL (ref 78.0–100.0)
Monocytes Absolute: 0.5 10*3/uL (ref 0.1–1.0)
Monocytes Relative: 8 % (ref 3–12)
NEUTROS PCT: 43 % (ref 43–77)
Neutro Abs: 2.9 10*3/uL (ref 1.7–7.7)
Platelets: 252 10*3/uL (ref 150–400)
RBC: 4.83 MIL/uL (ref 3.87–5.11)
RDW: 13.5 % (ref 11.5–15.5)
WBC: 6.8 10*3/uL (ref 4.0–10.5)

## 2014-07-05 LAB — COMPREHENSIVE METABOLIC PANEL
ALK PHOS: 94 U/L (ref 39–117)
ALT: 17 U/L (ref 0–35)
ANION GAP: 12 (ref 5–15)
AST: 17 U/L (ref 0–37)
Albumin: 3.8 g/dL (ref 3.5–5.2)
BUN: 11 mg/dL (ref 6–23)
CO2: 25 mEq/L (ref 19–32)
Calcium: 9.1 mg/dL (ref 8.4–10.5)
Chloride: 103 mEq/L (ref 96–112)
Creatinine, Ser: 0.78 mg/dL (ref 0.50–1.10)
GFR calc Af Amer: >90 mL/min (ref >90)
GLUCOSE: 128 mg/dL — AB (ref 70–99)
POTASSIUM: 4.3 meq/L (ref 3.7–5.3)
Sodium: 140 mEq/L (ref 137–147)
TOTAL PROTEIN: 7.2 g/dL (ref 6.0–8.3)
Total Bilirubin: 0.3 mg/dL (ref 0.3–1.2)

## 2014-07-05 LAB — URINALYSIS, ROUTINE W REFLEX MICROSCOPIC
BILIRUBIN URINE: NEGATIVE
Glucose, UA: NEGATIVE mg/dL
Hgb urine dipstick: NEGATIVE
KETONES UR: NEGATIVE mg/dL
Leukocytes, UA: NEGATIVE
NITRITE: NEGATIVE
PROTEIN: NEGATIVE mg/dL
Specific Gravity, Urine: >1.03 — ABNORMAL HIGH (ref 1.005–1.030)
UROBILINOGEN UA: 0.2 mg/dL (ref 0.0–1.0)
pH: 5 (ref 5.0–8.0)

## 2014-07-05 MED ORDER — OXYCODONE-ACETAMINOPHEN 5-325 MG PO TABS: 1.0000 | ORAL_TABLET | Freq: Four times a day (QID) | ORAL | Status: DC | PRN

## 2014-07-05 MED ORDER — HYDROMORPHONE HCL 1 MG/ML IJ SOLN
1.0000 mg | Freq: Once | INTRAMUSCULAR | Status: AC
  Administered 2014-07-05: 1 mg via INTRAVENOUS
  Filled 2014-07-05: qty 1

## 2014-07-05 MED ORDER — PREDNISONE 10 MG PO TABS: 20.0000 mg | ORAL_TABLET | Freq: Every day | ORAL | Status: DC

## 2014-07-05 MED ORDER — HYDROMORPHONE HCL 1 MG/ML IJ SOLN: 1.0000 mg | Freq: Once | INTRAMUSCULAR | Status: DC

## 2014-07-05 MED ORDER — IOHEXOL 300 MG/ML  SOLN
50.0000 mL | Freq: Once | INTRAMUSCULAR | Status: AC | PRN
  Administered 2014-07-05: 50 mL via ORAL

## 2014-07-05 MED ORDER — ONDANSETRON HCL 4 MG/2ML IJ SOLN: 4.0000 mg | Freq: Once | INTRAMUSCULAR | Status: DC

## 2014-07-05 MED ORDER — IOHEXOL 300 MG/ML  SOLN
100.0000 mL | Freq: Once | INTRAMUSCULAR | Status: AC | PRN
  Administered 2014-07-05: 100 mL via INTRAVENOUS

## 2014-07-05 MED ORDER — SODIUM CHLORIDE 0.9 % IV BOLUS (SEPSIS)
1000.0000 mL | Freq: Once | INTRAVENOUS | Status: AC
  Administered 2014-07-05: 1000 mL via INTRAVENOUS

## 2014-07-05 MED ORDER — ONDANSETRON HCL 4 MG/2ML IJ SOLN
4.0000 mg | Freq: Once | INTRAMUSCULAR | Status: AC
  Administered 2014-07-05: 4 mg via INTRAVENOUS
  Filled 2014-07-05: qty 2

## 2014-07-05 NOTE — ED Notes (Signed)
EDP at bedside  

## 2014-07-05 NOTE — Discharge Instructions (Signed)
Follow up with your md next week. °

## 2014-07-05 NOTE — ED Notes (Addendum)
D/c papers printed and reviewed with pt prior to med orders being placed. IV removed for d/c before orders showed in chart.

## 2014-07-05 NOTE — ED Notes (Signed)
Pt still in CT

## 2014-07-05 NOTE — ED Notes (Signed)
Pt c/o abd pain 10/10 since Wednesday, vomiting as well.

## 2014-07-05 NOTE — ED Notes (Signed)
Pt reports abdominal pain & nausea the started 2 days ago. Pt not vomiting at this time.

## 2014-07-05 NOTE — ED Provider Notes (Addendum)
CSN: [id number]     Arrival date & time 07/05/14  0346 History   First MD Initiated Contact with Patient 07/05/14 [phone]     Chief Complaint  Patient presents with  . Abdominal Pain     (Consider location/radiation/quality/duration/timing/severity/associated sxs/prior Treatment) Patient is a 53 y.o. female presenting with abdominal pain. The history is provided by the patient (the pt complains of abd pain for a week.  she states it feels like her chrons pain).  Abdominal Pain Pain location:  Generalized Pain quality: aching   Pain radiates to:  Does not radiate Pain severity:  Moderate Onset quality:  Gradual Timing:  Constant Progression:  Worsening Chronicity:  Recurrent Associated symptoms: no chest pain, no cough, no diarrhea, no fatigue and no hematuria     Past Medical History  Diagnosis Date  . Environmental allergies   . Anxiety   . Family history of anesthesia complication     MH during C Section  . 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 Complication of anesthesia   . H/O colonoscopy   . Migraine   . Crohn's disease June 2013  . IBS (irritable bowel syndrome) June 2014  . Obsessive-compulsive disorder   . PTSD (post-traumatic stress disorder)   . Seizures   . Chronic abdominal pain   . Chronic headaches   . History of electroencephalogram 03/2014    normal   Past Surgical History  Procedure Laterality Date  . Partial hysterectomy    . Tubal ligation    . Cesarean section      X2  . Bladder surgery      age 65  . Esophagogastroduodenoscopy      remote, had ulcers, [place], Dr. Truddie Coco  . Colonoscopy  05/22/2012    SLF: Polyps, multiple hyperplastic in the sigmoid colon/Polyp in the rectum/  MODERATE Diverticulosis throughout the colon/ Internal hemorrhoids  . Esophagogastroduodenoscopy  05/22/2012    SLF: Stricture in the distal esophagus/Moderate gastritis   Family History  Problem Relation Age of Onset  .  Colon cancer Maternal Grandfather     ?less than age 77s  . Heart attack Father   . Alcohol abuse Father   . Diabetes Mother   . Hypertension Mother   . Dementia Mother   . Depression Mother   . Inflammatory bowel disease Neg Hx   . ADD / ADHD Neg Hx   . Drug abuse Neg Hx   . Paranoid behavior Neg Hx   . Schizophrenia Neg Hx   . Seizures Neg Hx   . Sexual abuse Neg Hx   . Physical abuse Neg Hx   . Colon polyps Neg Hx   . Liver disease Brother     does know details, but related to MVA?  Marland Kitchen Anxiety disorder Sister   . Bipolar disorder Sister   . OCD Sister   . Anxiety disorder Sister   . Depression Sister   . Malignant hyperthermia Daughter   . Heart murmur Son    History  Substance Use Topics  . Smoking status: Former Smoker    Quit date: 01/01/1989  . Smokeless tobacco: Never Used     Comment: Quit a few years ago  . Alcohol Use: No     Comment: socially   OB History   Grav Para Term Preterm Abortions TAB SAB Ect Mult Living  Review of Systems  Constitutional: Negative for appetite change and fatigue.  HENT: Negative for congestion, ear discharge and sinus pressure.   Eyes: Negative for discharge.  Respiratory: Negative for cough.   Cardiovascular: Negative for chest pain.  Gastrointestinal: Positive for abdominal pain. Negative for diarrhea.  Genitourinary: Negative for frequency and hematuria.  Musculoskeletal: Negative for back pain.  Skin: Negative for rash.  Neurological: Negative for seizures and headaches.  Psychiatric/Behavioral: Negative for hallucinations.      Allergies  Aspirin; Peanut-containing drug products; Penicillins; Sulfa antibiotics; Codeine; and Rubbing alcohol  Home Medications   Prior to Admission medications   Medication Sig Start Date End Date Taking? Authorizing Provider  ALPRAZolam Duanne Moron) 0.5 MG tablet Take 1 tablet (0.5 mg total) by mouth at bedtime as needed for anxiety. 09/04/13  Yes Mary-Margaret Hassell Done,  FNP  busPIRone (BUSPAR) 15 MG tablet Take 1 tablet (15 mg total) by mouth 4 (four) times daily. 09/04/13  Yes Mary-Margaret Hassell Done, FNP  Carbamazepine (EQUETRO) 300 MG CP12 Take 600 mg by mouth at bedtime.   Yes Historical Provider, MD  docusate sodium (COLACE) 100 MG capsule Take 100 mg by mouth 2 (two) times daily.   Yes Historical Provider, MD  EPINEPHrine (EPI-PEN) 0.3 mg/0.3 mL DEVI Inject 0.3 mg into the muscle as needed (for allergic reaction).    Yes Historical Provider, MD  EPINEPHrine (EPIPEN) 0.3 mg/0.3 mL IJ SOAJ injection Inject 0.3 mLs (0.3 mg total) into the muscle as needed (for allergic reaction). 04/07/14  Yes Francine Graven, DO  esomeprazole (NEXIUM) 40 MG capsule Take 40 mg by mouth daily at 12 noon.   Yes Historical Provider, MD  Linaclotide (LINZESS) 290 MCG CAPS capsule 1 PO WITH BREAKFAST 07/04/14  Yes Danie Binder, MD  ondansetron (ZOFRAN) 4 MG tablet Take 1 tablet (4 mg total) by mouth every 6 (six) hours as needed for nausea. 02/21/14  Yes Danie Binder, MD  predniSONE (DELTASONE) 20 MG tablet Take 2 tablets (40 mg total) by mouth daily. 04/07/14  Yes Francine Graven, DO  topiramate (TOPAMAX) 25 MG tablet Take 1 tablet (25 mg total) by mouth 2 (two) times daily. 04/16/14  Yes Hulen Luster, DO  traMADol (ULTRAM) 50 MG tablet Take 50 mg by mouth every 6 (six) hours as needed for moderate pain.    Yes Historical Provider, MD   BP 130/89  Pulse 61  Temp(Src) 98.2 F (36.8 C) (Oral)  Resp 20  Ht 5' 2"  (1.575 m)  Wt 196 lb (88.905 kg)  BMI 35.84 kg/m2  SpO2 98% Physical Exam  Constitutional: She is oriented to person, place, and time. She appears well-developed.  HENT:  Head: Normocephalic.  Eyes: Conjunctivae and EOM are normal. No scleral icterus.  Neck: Neck supple. No thyromegaly present.  Cardiovascular: Normal rate and regular rhythm.  Exam reveals no gallop and no friction rub.   No murmur heard. Pulmonary/Chest: No stridor. She has no wheezes. She has  no rales. She exhibits no tenderness.  Abdominal: She exhibits no distension. There is tenderness. There is no rebound.  Mild tenderness throughout  Musculoskeletal: Normal range of motion. She exhibits no edema.  Lymphadenopathy:    She has no cervical adenopathy.  Neurological: She is oriented to person, place, and time. She exhibits normal muscle tone. Coordination normal.  Skin: No rash noted. No erythema.  Psychiatric: She has a normal mood and affect. Her behavior is normal.    ED Course  Procedures (including critical care time) Labs Review  Labs Reviewed  COMPREHENSIVE METABOLIC PANEL - Abnormal; Notable for the following:    Glucose, Bld 128 (*)    All other components within normal limits  URINALYSIS, ROUTINE W REFLEX MICROSCOPIC - Abnormal; Notable for the following:    Specific Gravity, Urine >1.030 (*)    All other components within normal limits  CBC WITH DIFFERENTIAL    Imaging Review No results found.   EKG Interpretation None      MDM   Final diagnoses:  None        Maudry Diego, MD 07/05/14 [place], MD 07/05/14 [phone]

## 2014-07-09 NOTE — Progress Notes (Signed)
cc'ed to pcp °

## 2014-07-25 ENCOUNTER — Other Ambulatory Visit: Payer: Self-pay

## 2014-07-25 DIAGNOSIS — Z1231 Encounter for screening mammogram for malignant neoplasm of breast: Secondary | ICD-10-CM

## 2014-07-28 ENCOUNTER — Encounter (HOSPITAL_COMMUNITY): Payer: Self-pay | Admitting: Emergency Medicine

## 2014-07-28 ENCOUNTER — Emergency Department (HOSPITAL_COMMUNITY)
Admission: EM | Admit: 2014-07-28 | Discharge: 2014-07-28 | Disposition: A | Discharge status: Home / Self Care | Payer: Managed Care, Other (non HMO) | Attending: Emergency Medicine | Admitting: Emergency Medicine

## 2014-07-28 DIAGNOSIS — G40909 Epilepsy, unspecified, not intractable, without status epilepticus: Secondary | ICD-10-CM | POA: Diagnosis not present

## 2014-07-28 DIAGNOSIS — G43909 Migraine, unspecified, not intractable, without status migrainosus: Secondary | ICD-10-CM | POA: Insufficient documentation

## 2014-07-28 DIAGNOSIS — Z880 Allergy status to penicillin: Secondary | ICD-10-CM | POA: Diagnosis not present

## 2014-07-28 DIAGNOSIS — R21 Rash and other nonspecific skin eruption: Secondary | ICD-10-CM | POA: Diagnosis present

## 2014-07-28 DIAGNOSIS — L299 Pruritus, unspecified: Secondary | ICD-10-CM | POA: Diagnosis not present

## 2014-07-28 DIAGNOSIS — Z79899 Other long term (current) drug therapy: Secondary | ICD-10-CM | POA: Diagnosis not present

## 2014-07-28 DIAGNOSIS — G8929 Other chronic pain: Secondary | ICD-10-CM | POA: Diagnosis not present

## 2014-07-28 DIAGNOSIS — Z87891 Personal history of nicotine dependence: Secondary | ICD-10-CM | POA: Diagnosis not present

## 2014-07-28 DIAGNOSIS — F419 Anxiety disorder, unspecified: Secondary | ICD-10-CM | POA: Diagnosis not present

## 2014-07-28 DIAGNOSIS — T7840XA Allergy, unspecified, initial encounter: Secondary | ICD-10-CM

## 2014-07-28 LAB — CBC WITH DIFFERENTIAL/PLATELET
Basophils Absolute: 0 10*3/uL (ref 0.0–0.1)
Basophils Relative: 0 % (ref 0–1)
EOS ABS: 0.2 10*3/uL (ref 0.0–0.7)
Eosinophils Relative: 3 % (ref 0–5)
HCT: 39.3 % (ref 36.0–46.0)
Hemoglobin: 13.1 g/dL (ref 12.0–15.0)
Lymphocytes Relative: 38 % (ref 12–46)
Lymphs Abs: 2.2 10*3/uL (ref 0.7–4.0)
MCH: 29.4 pg (ref 26.0–34.0)
MCHC: 33.3 g/dL (ref 30.0–36.0)
MCV: 88.3 fL (ref 78.0–100.0)
Monocytes Absolute: 0.4 10*3/uL (ref 0.1–1.0)
Monocytes Relative: 8 % (ref 3–12)
NEUTROS PCT: 51 % (ref 43–77)
Neutro Abs: 2.9 10*3/uL (ref 1.7–7.7)
PLATELETS: 234 10*3/uL (ref 150–400)
RBC: 4.45 MIL/uL (ref 3.87–5.11)
RDW: 13.3 % (ref 11.5–15.5)
WBC: 5.7 10*3/uL (ref 4.0–10.5)

## 2014-07-28 LAB — BASIC METABOLIC PANEL
Anion gap: 10 (ref 5–15)
BUN: 13 mg/dL (ref 6–23)
CO2: 25 mEq/L (ref 19–32)
Calcium: 8.8 mg/dL (ref 8.4–10.5)
Chloride: 108 mEq/L (ref 96–112)
Creatinine, Ser: 0.75 mg/dL (ref 0.50–1.10)
GFR calc Af Amer: >90 mL/min (ref >90)
Glucose, Bld: 127 mg/dL — ABNORMAL HIGH (ref 70–99)
POTASSIUM: 3.9 meq/L (ref 3.7–5.3)
SODIUM: 143 meq/L (ref 137–147)

## 2014-07-28 MED ORDER — ONDANSETRON HCL 4 MG/2ML IJ SOLN
4.0000 mg | Freq: Once | INTRAMUSCULAR | Status: AC
  Administered 2014-07-28: 4 mg via INTRAVENOUS
  Filled 2014-07-28: qty 2

## 2014-07-28 MED ORDER — DIPHENHYDRAMINE HCL 50 MG/ML IJ SOLN
50.0000 mg | Freq: Once | INTRAMUSCULAR | Status: AC
  Administered 2014-07-28: 50 mg via INTRAVENOUS
  Filled 2014-07-28: qty 1

## 2014-07-28 MED ORDER — FAMOTIDINE 20 MG PO TABS: 20.0000 mg | ORAL_TABLET | Freq: Two times a day (BID) | ORAL | Status: DC

## 2014-07-28 MED ORDER — DIPHENHYDRAMINE HCL 50 MG/ML IJ SOLN
25.0000 mg | Freq: Once | INTRAMUSCULAR | Status: AC
  Administered 2014-07-28: 25 mg via INTRAVENOUS
  Filled 2014-07-28: qty 1

## 2014-07-28 MED ORDER — METHYLPREDNISOLONE SODIUM SUCC 125 MG IJ SOLR
125.0000 mg | Freq: Once | INTRAMUSCULAR | Status: AC
  Administered 2014-07-28: 125 mg via INTRAVENOUS
  Filled 2014-07-28: qty 2

## 2014-07-28 MED ORDER — TOBRAMYCIN-DEXAMETHASONE 0.3-0.1 % OP SUSP: OPHTHALMIC | Status: DC

## 2014-07-28 MED ORDER — FAMOTIDINE IN NACL 20-0.9 MG/50ML-% IV SOLN
20.0000 mg | Freq: Once | INTRAVENOUS | Status: AC
Start: 2014-07-28 — End: 2014-07-28
  Administered 2014-07-28: 20 mg via INTRAVENOUS
  Filled 2014-07-28: qty 50

## 2014-07-28 MED ORDER — HYDROMORPHONE HCL 1 MG/ML IJ SOLN
0.5000 mg | Freq: Once | INTRAMUSCULAR | Status: AC
  Administered 2014-07-28: 0.5 mg via INTRAVENOUS
  Filled 2014-07-28: qty 1

## 2014-07-28 MED ORDER — SODIUM CHLORIDE 0.9 % IV BOLUS (SEPSIS)
500.0000 mL | Freq: Once | INTRAVENOUS | Status: AC
  Administered 2014-07-28: 500 mL via INTRAVENOUS

## 2014-07-28 MED ORDER — PREDNISONE 10 MG PO TABS: 20.0000 mg | ORAL_TABLET | Freq: Every day | ORAL | Status: DC

## 2014-07-28 NOTE — ED Notes (Signed)
MD at bedside. 

## 2014-07-28 NOTE — Discharge Instructions (Signed)
Take benadryl 25 mg every 4 hours for itching or rash.  Follow up with your md this week

## 2014-07-28 NOTE — ED Provider Notes (Signed)
CSN: [id number]     Arrival date & time 07/28/14  0938 History  This chart was scribed for Kathryn Diego, MD by Zola Button, ED Scribe. This patient was seen in room APA08/APA08 and the patient's care was started at 9:04 AM.       Chief Complaint  Patient presents with  . Allergic Reaction      Patient is a 53 y.o. female presenting with allergic reaction. The history is provided by the patient (pt complains of swelling and rash to face.  pt has had inflamed conjunctiva for 3 days). No language interpreter was used.  Allergic Reaction Presenting symptoms: itching and rash   Itching:    Location:  Face   Severity:  Moderate Severity:  Moderate Prior allergic episodes:  Food/nut allergies Context: no animal exposure   Relieved by:  None tried Worsened by:  Nothing tried Ineffective treatments:  None tried  HPI Comments: Kathryn Olson is a 53 y.o. female who presents to the Emergency Department complaining of an allergic reaction that occurred PTA. She notes itching in her neck and face, and burning and swelling in her eyes bilaterally. Patient denies having similar episodes previously, but she does note having an allergic reaction to peanut oil months ago during which she stopped breathing. Patient does not know what caused her current allergic reaction.  Past Medical History  Diagnosis Date  . Environmental allergies   . Anxiety   . Family history of anesthesia complication     MH during C Section  . 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 Complication of anesthesia   . H/O colonoscopy   . Migraine   . Crohn's disease June 2013  . IBS (irritable bowel syndrome) June 2014  . Obsessive-compulsive disorder   . PTSD (post-traumatic stress disorder)   . Seizures   . Chronic abdominal pain   . Chronic headaches   . History of electroencephalogram 03/2014    normal   Past Surgical History  Procedure Laterality Date  . Partial hysterectomy     . Tubal ligation    . Cesarean section      X2  . Bladder surgery      age 54  . Esophagogastroduodenoscopy      remote, had ulcers, [place], Dr. Truddie Coco  . Colonoscopy  05/22/2012    SLF: Polyps, multiple hyperplastic in the sigmoid colon/Polyp in the rectum/  MODERATE Diverticulosis throughout the colon/ Internal hemorrhoids  . Esophagogastroduodenoscopy  05/22/2012    SLF: Stricture in the distal esophagus/Moderate gastritis   Family History  Problem Relation Age of Onset  . Colon cancer Maternal Grandfather     ?less than age 10s  . Heart attack Father   . Alcohol abuse Father   . Diabetes Mother   . Hypertension Mother   . Dementia Mother   . Depression Mother   . Inflammatory bowel disease Neg Hx   . ADD / ADHD Neg Hx   . Drug abuse Neg Hx   . Paranoid behavior Neg Hx   . Schizophrenia Neg Hx   . Seizures Neg Hx   . Sexual abuse Neg Hx   . Physical abuse Neg Hx   . Colon polyps Neg Hx   . Liver disease Brother     does know details, but related to MVA?  Marland Kitchen Anxiety disorder Sister   . Bipolar disorder Sister   . OCD Sister   . Anxiety  disorder Sister   . Depression Sister   . Malignant hyperthermia Daughter   . Heart murmur Son    History  Substance Use Topics  . Smoking status: Former Smoker    Quit date: 01/01/1989  . Smokeless tobacco: Never Used     Comment: Quit a few years ago  . Alcohol Use: No     Comment: socially   OB History   Grav Para Term Preterm Abortions TAB SAB Ect Mult Living                 Review of Systems  Constitutional: Negative for appetite change and fatigue.  HENT: Negative for congestion, ear discharge and sinus pressure.   Eyes: Negative for discharge.  Respiratory: Negative for cough.   Cardiovascular: Negative for chest pain.  Gastrointestinal: Negative for abdominal pain and diarrhea.  Genitourinary: Negative for frequency and hematuria.  Musculoskeletal: Negative for back pain.  Skin: Positive for itching and  rash.  Neurological: Negative for seizures and headaches.  Psychiatric/Behavioral: Negative for hallucinations.      Allergies  Aspirin; Peanut-containing drug products; Penicillins; Sulfa antibiotics; Codeine; and Rubbing alcohol  Home Medications   Prior to Admission medications   Medication Sig Start Date End Date Taking? Authorizing Provider  ALPRAZolam Duanne Moron) 0.5 MG tablet Take 0.5 mg by mouth 3 (three) times daily as needed for anxiety. 09/04/13  Yes Mary-Margaret Hassell Done, FNP  busPIRone (BUSPAR) 15 MG tablet Take 1 tablet (15 mg total) by mouth 4 (four) times daily. 09/04/13  Yes Mary-Margaret Hassell Done, FNP  Carbamazepine (EQUETRO) 100 MG CP12 12 hr capsule Take 100 mg by mouth 2 (two) times daily.   Yes Historical Provider, MD  dexlansoprazole (DEXILANT) 60 MG capsule Take 60 mg by mouth daily.   Yes Historical Provider, MD  docusate sodium (COLACE) 100 MG capsule Take 100 mg by mouth 2 (two) times daily.   Yes Historical Provider, MD  DULoxetine (CYMBALTA) 60 MG capsule Take 60 mg by mouth daily.   Yes Historical Provider, MD  EPINEPHrine (EPI-PEN) 0.3 mg/0.3 mL DEVI Inject 0.3 mg into the muscle as needed (for allergic reaction).    Yes Historical Provider, MD  esomeprazole (NEXIUM) 40 MG capsule Take 40 mg by mouth daily at 12 noon.   Yes Historical Provider, MD  Linaclotide (LINZESS) 290 MCG CAPS capsule 1 PO WITH BREAKFAST 07/04/14  Yes Danie Binder, MD  ondansetron (ZOFRAN) 4 MG tablet Take 1 tablet (4 mg total) by mouth every 6 (six) hours as needed for nausea. 02/21/14  Yes Danie Binder, MD  topiramate (TOPAMAX) 100 MG tablet Take 100 mg by mouth 2 (two) times daily.   Yes Historical Provider, MD  topiramate (TOPAMAX) 25 MG tablet Take 1 tablet (25 mg total) by mouth 2 (two) times daily. 04/16/14  Yes Hulen Luster, DO  traMADol (ULTRAM) 50 MG tablet Take 50 mg by mouth every 6 (six) hours as needed for moderate pain.    Yes Historical Provider, MD   BP 162/87  Pulse  79  Temp(Src) 98.9 F (37.2 C) (Oral)  Resp 18  Ht 5' 2"  (1.575 m)  Wt 192 lb (87.091 kg)  BMI 35.11 kg/m2  SpO2 99% Physical Exam  Nursing note and vitals reviewed. Constitutional: She is oriented to person, place, and time. She appears well-developed.  HENT:  Head: Normocephalic.  Eyes: EOM are normal. No scleral icterus.  Swelling around both eyes,  Allergic conjunctivitis  Neck: Neck supple. No thyromegaly present.  Swelling  around neck  Cardiovascular: Normal rate and regular rhythm.  Exam reveals no gallop and no friction rub.   No murmur heard. Pulmonary/Chest: No stridor. She has no wheezes. She has no rales. She exhibits no tenderness.  Abdominal: She exhibits no distension. There is no tenderness. There is no rebound.  Musculoskeletal: Normal range of motion. She exhibits no edema.  Lymphadenopathy:    She has no cervical adenopathy.  Neurological: She is oriented to person, place, and time. She exhibits normal muscle tone. Coordination normal.  Skin: Rash noted. No erythema.  Rash on face  Psychiatric: She has a normal mood and affect. Her behavior is normal.    ED Course  Procedures  DIAGNOSTIC STUDIES: Oxygen Saturation is 99% on RA, nml by my interpretation.    COORDINATION OF CARE: 9:06 AM-Discussed treatment plan which includes medications and labs with pt at bedside and pt agreed to plan.   Labs Review Labs Reviewed  CBC WITH DIFFERENTIAL  BASIC METABOLIC PANEL    Imaging Review No results found.   EKG Interpretation None      MDM   Final diagnoses:  None   The chart was scribed for me under my direct supervision.  I personally performed the history, physical, and medical decision making and all procedures in the evaluation of this patient.Kathryn Diego, MD 07/28/14 1225

## 2014-07-28 NOTE — ED Notes (Signed)
Pt states that she has started having upper abdomen pain. MD notified.

## 2014-07-28 NOTE — ED Notes (Addendum)
Pt reports rash,blurred vision, and bilateral eye swelling x3 days. Pt denies any pain or new self care products/meds. Pt reports took benadryl prior to arrival with minimal relief. Airway patent.

## 2014-07-28 NOTE — ED Notes (Signed)
Pt states she is beginning to itch again. Dr. Roderic Palau made aware.

## 2014-08-05 ENCOUNTER — Ambulatory Visit
Admission: RE | Admit: 2014-08-05 | Discharge: 2014-08-05 | Disposition: A | Discharge status: Home / Self Care | Payer: Managed Care, Other (non HMO) | Source: Ambulatory Visit

## 2014-08-05 DIAGNOSIS — Z1231 Encounter for screening mammogram for malignant neoplasm of breast: Secondary | ICD-10-CM

## 2014-08-11 ENCOUNTER — Emergency Department (HOSPITAL_COMMUNITY)
Admission: EM | Admit: 2014-08-11 | Discharge: 2014-08-11 | Disposition: A | Discharge status: Home / Self Care | Payer: Managed Care, Other (non HMO) | Attending: Emergency Medicine | Admitting: Emergency Medicine

## 2014-08-11 ENCOUNTER — Encounter (HOSPITAL_COMMUNITY): Payer: Self-pay | Admitting: Emergency Medicine

## 2014-08-11 DIAGNOSIS — H02843 Edema of right eye, unspecified eyelid: Secondary | ICD-10-CM | POA: Diagnosis not present

## 2014-08-11 DIAGNOSIS — Z880 Allergy status to penicillin: Secondary | ICD-10-CM | POA: Insufficient documentation

## 2014-08-11 DIAGNOSIS — Z79899 Other long term (current) drug therapy: Secondary | ICD-10-CM | POA: Insufficient documentation

## 2014-08-11 DIAGNOSIS — H02846 Edema of left eye, unspecified eyelid: Secondary | ICD-10-CM | POA: Insufficient documentation

## 2014-08-11 DIAGNOSIS — R609 Edema, unspecified: Secondary | ICD-10-CM | POA: Insufficient documentation

## 2014-08-11 DIAGNOSIS — G8929 Other chronic pain: Secondary | ICD-10-CM | POA: Diagnosis not present

## 2014-08-11 DIAGNOSIS — T7840XA Allergy, unspecified, initial encounter: Secondary | ICD-10-CM

## 2014-08-11 DIAGNOSIS — F419 Anxiety disorder, unspecified: Secondary | ICD-10-CM | POA: Diagnosis not present

## 2014-08-11 DIAGNOSIS — Z8719 Personal history of other diseases of the digestive system: Secondary | ICD-10-CM | POA: Diagnosis not present

## 2014-08-11 DIAGNOSIS — G43909 Migraine, unspecified, not intractable, without status migrainosus: Secondary | ICD-10-CM | POA: Diagnosis not present

## 2014-08-11 DIAGNOSIS — G40909 Epilepsy, unspecified, not intractable, without status epilepticus: Secondary | ICD-10-CM | POA: Insufficient documentation

## 2014-08-11 DIAGNOSIS — Z87891 Personal history of nicotine dependence: Secondary | ICD-10-CM | POA: Diagnosis not present

## 2014-08-11 DIAGNOSIS — L500 Allergic urticaria: Secondary | ICD-10-CM | POA: Insufficient documentation

## 2014-08-11 MED ORDER — PREDNISONE 50 MG PO TABS
60.0000 mg | ORAL_TABLET | Freq: Once | ORAL | Status: AC
  Administered 2014-08-11: 60 mg via ORAL
  Filled 2014-08-11 (×2): qty 1

## 2014-08-11 MED ORDER — HYDROXYZINE HCL 25 MG PO TABS: 25.0000 mg | ORAL_TABLET | Freq: Four times a day (QID) | ORAL | Status: DC | PRN

## 2014-08-11 MED ORDER — FAMOTIDINE 20 MG PO TABS
40.0000 mg | ORAL_TABLET | Freq: Once | ORAL | Status: AC
  Administered 2014-08-11: 40 mg via ORAL
  Filled 2014-08-11: qty 2

## 2014-08-11 MED ORDER — DIPHENHYDRAMINE HCL 50 MG/ML IJ SOLN
50.0000 mg | Freq: Once | INTRAMUSCULAR | Status: AC
  Administered 2014-08-11: 50 mg via INTRAMUSCULAR
  Filled 2014-08-11: qty 1

## 2014-08-11 MED ORDER — PREDNISONE 20 MG PO TABS: 40.0000 mg | ORAL_TABLET | Freq: Every day | ORAL | Status: DC

## 2014-08-11 NOTE — Discharge Instructions (Signed)
Emergency Department Resource Guide 1) Find a Doctor and Pay Out of Pocket Although you won't have to find out who is covered by your insurance plan, it is a good idea to ask around and get recommendations. You will then need to call the office and see if the doctor you have chosen will accept you as a new patient and what types of options they offer for patients who are self-pay. Some doctors offer discounts or will set up payment plans for their patients who do not have insurance, but you will need to ask so you aren't surprised when you get to your appointment.  2) Contact Your Local Health Department Not all health departments have doctors that can see patients for sick visits, but many do, so it is worth a call to see if yours does. If you don't know where your local health department is, you can check in your phone book. The CDC also has a tool to help you locate your state's health department, and many state websites also have listings of all of their local health departments.  3) Find a [facility] If your illness is not likely to be very severe or complicated, you may want to try a walk in clinic. These are popping up all over the country in pharmacies, drugstores, and shopping centers. They're usually staffed by nurse practitioners or physician assistants that have been trained to treat common illnesses and complaints. They're usually fairly quick and inexpensive. However, if you have serious medical issues or chronic medical problems, these are probably not your best option.  No Primary Care Doctor: - Call Health Connect at  [phone] - they can help you locate a primary care doctor that  accepts your insurance, provides certain services, etc. - Physician Referral Service- [phone]  Chronic Pain Problems: Organization         Address  Phone   Notes  [facility]  [phone] Patients need to be referred by their primary care doctor.   Medication  Assistance: Organization         Address  Phone   Notes  [facility] Medication [facility] [address] [place], [place] [address] [phone] --Must be a resident of [facility] -- Must have NO insurance coverage whatsoever (no Medicaid/ Medicare, etc.) -- The pt. MUST have a primary care doctor that directs their care regularly and follows them in the community   [facility]  [phone]   [employer]  [phone]    Agencies that provide inexpensive medical care: Organization         Address  Phone   Notes  [place]  [phone]   [facility]    [phone]   [facility] [place], [place] [phone]   [place] [address], [place] [phone]   Planned Parenthood    [phone]   [facility]    [phone]   [place] and [place] [address], [place] Phone:  [phone], Fax:  [phone] Hours of Operation:  9 am - 6 pm, M-F.  Also accepts Medicaid/Medicare and self-pay.  [facility] for [place], [address], [place] Phone: [phone], Fax: [phone]. Hours of Operation:  8:30 am - 5:30 pm, M-F.  Also accepts Medicaid and self-pay.  [facility] [address], [place] Phone: [phone]   [place], [place], [place] [phone], Ext. [phone] Mondays & Thursdays: 7-9 AM.  First 15 patients are seen on a first come, first serve basis.    [place] Providers:  Organization         Address  Phone   Notes  [facility] [address], [place] [phone] Also accepts self-pay patients.  [facility] [address], [place]  [phone]   [place], [address], [place]  [phone]   [facility] [address] [place] [phone]   Lucianne Lei [address], [place]   [phone] Only accepts [place] Access [place] patients after they have their name applied to their card.   Self-Pay (no insurance) in [facility]:  Organization         Address  Phone   Notes  Sickle Cell Patients, [facility] [place] [phone]   [facility] Urgent Care [place] [phone]   [facility] [place]  [place], [place],  [phone]   [facility]/Dr. Osei-Bonsu  [address], [place] or [address], [place] [phone] Phone number for both [place] and [place] locations is the same.  Urgent Medical and [facility] [address], [place] [phone]   [facility] [address], [place] or [address] [phone]   [facility] [address], [place] [phone], phone; [phone], fax Sees patients 1st and 3rd Saturday of every month.  Must not qualify for public or private insurance (i.e. Medicaid, Medicare, [place] Health Choice, Veterans' Benefits)  Household income should be no more than 200% of the poverty level The clinic cannot treat you if you are pregnant or think you are pregnant  Sexually transmitted diseases are not treated at the clinic.    Dental Care: Organization         Address  Phone  Notes  [facility] Department of [facility] [phone] Accepts children up to age 11 who are enrolled in [place] or [place]; pregnant women with a Medicaid card; and children who have applied for Medicaid or [place] Health Choice, but were declined, whose parents can pay a reduced fee at time of service.  [facility]  Department of [facility]  [address], [place] [phone] Accepts children up to age 31 who are enrolled in [place] or [place]; pregnant women with a Medicaid card; and children who have applied for Medicaid or  Health Choice, but were declined, whose parents can pay a reduced fee at time of service.  [place] Adult Dental Access PROGRAM  [place] [phone] Patients are seen by appointment only. Walk-ins are not accepted. [place] will see patients 45 years of age and older. Monday - Tuesday (8am-5pm) Most Wednesdays (8:30-5pm) $30 per visit, cash only  [facility] Adult Dental Access PROGRAM  [address], [facility] [phone] Patients are seen by appointment only. Walk-ins are not accepted. [place] will see patients 63 years of age and older. One  Wednesday Evening (Monthly: Volunteer Based).  $30 per visit, cash only  [place]  [phone] for adults; Children under age 62, call Graduate Pediatric Dentistry at [phone]. Children aged 16-14, please call [phone] to request a pediatric application.  Dental services are provided in all areas of dental care including fillings, crowns and bridges, complete and partial dentures, implants, gum treatment, root canals, and extractions. Preventive care is also provided. Treatment is provided to both adults and children. Patients are selected via a lottery and there is often a waiting list.   [facility] [address], [place]  [phone] [email or web address]   [facility] [address] [place], [place] [phone], Ext. [phone] Second and Fourth Thursday of each month, opens at 6:30 AM; Clinic ends at 9 AM.  Patients are seen on a first-come first-served basis, and a limited number are seen during each clinic.   [facility]  [address] Hillard Danker [place], [place] [phone]    Eligibility Requirements You must have lived in [place], [place], or [place] counties for at least the last three months.   You cannot be eligible for state or federal sponsored [employer], including [employer], [place], or [employer].   You generally cannot be eligible for healthcare insurance through your employer.    How to apply: Eligibility screenings are held every Tuesday and Wednesday afternoon from 1:00 pm until 4:00 pm. You do not need an appointment for the interview!  [facility] [address], [place], [place]   [place]  [place] Department  [place]  [phone]    Behavioral Health Resources in the Community: Intensive Outpatient Programs Organization         Address  Phone  Notes  [place]. [address], [place], [place] [phone]   [facility] Outpatient [address], [place], [place]   ADS: Alcohol & Drug S[facility] [address] [place], [place]   [place] [address],  [place], [place] or [phone]   Substance Abuse Resources Organization         Address  Phone  Notes  Alcohol and Drug Services  [phone]   [facility]  [phone]   The [place]   Chinita Pester  [phone]   Residential & Outpatient Substance Abuse Program  [phone]   Psychological Services Organization         Address  Phone  Notes  [facility] [place]  [place]  [phone]   [place] [address], [place] or [phone]    Mobile Crisis Teams Organization         Address  Phone  Notes  [facility], Mobile Crisis Care Unit  [phone]   [facility]ive Psychotherapeutic Services  [address]  [place], [place]   [place] Levels [address] [place] [phone]    Self-Help/Support Groups Organization         Address  Phone             Notes  [place]. of [place] - variety of support groups  [place] Call for more information  Narcotics Anonymous (NA), [facility] [address], [employer] [place]  2 meetings at this location  Residential Treatment Programs Organization         Address  Phone  Notes  [facility] [address],    [place]  [phone]   [facility]  [address] [place] [id number], [place], [place]   [place], [place] [phone] Admissions: 8am-3pm M-F  Incentives Substance [place] [address]    [place], [place] [phone]   [facility] [address] [place], [place], [place]   [facility] [address]  [place], [place]   [facility] Programs - Intensive Outpatient [address]een [address], [place], [place]   [facility] [place] [address]  [place], [place] [phone] or [phone]   [facility]idential Treatment Services (RTS) [address] [place], [place] Accepts Medicaid  [facility] [place] [address],  [facility] [place] [phone] Substance Abuse/Addiction Treatment   [facility] Organization         Address  Phone  Notes  [facility]  [phone]   Domenic Schwab, PhD [address] Arlis [place], [place]   [phone] or [phone]   [place], [place] [phone]   [facility] [address] [place], [place] [phone] Insurance/Medicaid/sponsorship through [facility] and Families [address], [address] [place]                                    [place], [place] [phone] [place] [address], [place] [phone]    Dr. Adele Schilder  [phone]   [facility] Dept. 1) [address] [place] 2) [place] 3)  [place] [address], [place] [phone]  [phone]   [place] [phone] or [phone] (After Hours)       Take the prescriptions as directed. Call your regular medical doctor and your Allergy doctor tomorrow to schedule a follow up appointment this week.  Return to the Emergency Department immediately sooner if worsening.

## 2014-08-11 NOTE — ED Notes (Signed)
Pt states with rash that itches to face for 2 weeks and was seen here for it, pt states her lips have started tingling today and vision blurry, pt denies SOB or difficultly swallowing

## 2014-08-11 NOTE — ED Provider Notes (Signed)
CSN: [id number]     Arrival date & time 08/11/14  1759 History   First MD Initiated Contact with Patient 08/11/14 1854     Chief Complaint  Patient presents with  . Allergic Reaction     HPI Pt was seen at 1910.  Per pt, c/o gradual onset and persistence of constant "itching rash to my face" that began when she woke up this morning. Pt states she had similar symptoms 2 weeks ago, was evaluated in the ED, dx allergic reaction and was prescribed prednisone, pepcid, and "some eye drops." States her symptoms improved after taking the medications. Pt states both episodes of her symptoms "start when I wake up in the morning" and "I go to bed just fine." Pt describes the rash as "itching." States her eyelids edema "make it hard to see." States she took a benadryl this morning without sustained relief. Denies intra-oral edema, no hoarse voice, no SOB/wheezing, no diffuse rash, no eye pain.    Past Medical History  Diagnosis Date  . Environmental allergies   . Anxiety   . Family history of anesthesia complication     MH during C Section  . 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 Complication of anesthesia   . H/O colonoscopy   . Migraine   . Crohn's disease June 2013  . IBS (irritable bowel syndrome) June 2014  . Obsessive-compulsive disorder   . PTSD (post-traumatic stress disorder)   . Seizures   . Chronic abdominal pain   . Chronic headaches   . History of electroencephalogram 03/2014    normal   Past Surgical History  Procedure Laterality Date  . Partial hysterectomy    . Tubal ligation    . Cesarean section      X2  . Bladder surgery      age 40  . Esophagogastroduodenoscopy      remote, had ulcers, [place], Dr. Truddie Coco  . Colonoscopy  05/22/2012    SLF: Polyps, multiple hyperplastic in the sigmoid colon/Polyp in the rectum/  MODERATE Diverticulosis throughout the colon/ Internal hemorrhoids  . Esophagogastroduodenoscopy  05/22/2012   SLF: Stricture in the distal esophagus/Moderate gastritis   Family History  Problem Relation Age of Onset  . Colon cancer Maternal Grandfather     ?less than age 63s  . Heart attack Father   . Alcohol abuse Father   . Diabetes Mother   . Hypertension Mother   . Dementia Mother   . Depression Mother   . Inflammatory bowel disease Neg Hx   . ADD / ADHD Neg Hx   . Drug abuse Neg Hx   . Paranoid behavior Neg Hx   . Schizophrenia Neg Hx   . Seizures Neg Hx   . Sexual abuse Neg Hx   . Physical abuse Neg Hx   . Colon polyps Neg Hx   . Liver disease Brother     does know details, but related to MVA?  Marland Kitchen Anxiety disorder Sister   . Bipolar disorder Sister   . OCD Sister   . Anxiety disorder Sister   . Depression Sister   . Malignant hyperthermia Daughter   . Heart murmur Son    History  Substance Use Topics  . Smoking status: Former Smoker    Quit date: 01/01/1989  . Smokeless tobacco: Never Used     Comment: Quit a few years ago  . Alcohol Use: No     Comment:  socially    Review of Systems ROS: Statement: All systems negative except as marked or noted in the HPI; Constitutional: Negative for fever and chills. ; ; Eyes: Negative for eye pain, redness and discharge. ; ; ENMT: Negative for ear pain, hoarseness, nasal congestion, sinus pressure and sore throat. ; ; Cardiovascular: Negative for chest pain, palpitations, diaphoresis, dyspnea and peripheral edema. ; ; Respiratory: Negative for cough, wheezing and stridor. ; ; Gastrointestinal: Negative for nausea, vomiting, diarrhea, abdominal pain, blood in stool, hematemesis, jaundice and rectal bleeding. . ; ; Genitourinary: Negative for dysuria, flank pain and hematuria. ; ; Musculoskeletal: Negative for back pain and neck pain. Negative for swelling and trauma.; ; Skin: +hives. Negative for abrasions, blisters, bruising and skin lesion.; ; Neuro: Negative for headache, lightheadedness and neck stiffness. Negative for weakness, altered  level of consciousness , altered mental status, extremity weakness, paresthesias, involuntary movement, seizure and syncope.    Allergies  Aspirin; Peanut-containing drug products; Penicillins; Sulfa antibiotics; Codeine; Adhesive; and Rubbing alcohol  Home Medications   Prior to Admission medications   Medication Sig Start Date End Date Taking? Authorizing Provider  albuterol (PROVENTIL HFA;VENTOLIN HFA) 108 (90 BASE) MCG/ACT inhaler Inhale 2 puffs into the lungs every 6 (six) hours as needed for wheezing or shortness of breath.   Yes Historical Provider, MD  ALPRAZolam Duanne Moron) 0.5 MG tablet Take 0.5 mg by mouth 3 (three) times daily as needed for anxiety. 09/04/13  Yes Mary-Margaret Hassell Done, FNP  busPIRone (BUSPAR) 15 MG tablet Take 1 tablet (15 mg total) by mouth 4 (four) times daily. 09/04/13  Yes Mary-Margaret Hassell Done, FNP  Carbamazepine (EQUETRO) 100 MG CP12 12 hr capsule Take 100 mg by mouth 2 (two) times daily.   Yes Historical Provider, MD  dexlansoprazole (DEXILANT) 60 MG capsule Take 60 mg by mouth daily.   Yes Historical Provider, MD  DULoxetine (CYMBALTA) 60 MG capsule Take 60 mg by mouth daily.   Yes Historical Provider, MD  esomeprazole (NEXIUM) 40 MG capsule Take 40 mg by mouth daily at 12 noon.   Yes Historical Provider, MD  levocetirizine (XYZAL) 5 MG tablet Take 5 mg by mouth every evening.   Yes Historical Provider, MD  Linaclotide (LINZESS) 290 MCG CAPS capsule Take 290 mcg by mouth daily. 07/04/14  Yes Danie Binder, MD  topiramate (TOPAMAX) 100 MG tablet Take 100 mg by mouth 2 (two) times daily.   Yes Historical Provider, MD  topiramate (TOPAMAX) 25 MG tablet Take 1 tablet (25 mg total) by mouth 2 (two) times daily. 04/16/14  Yes Hulen Luster, DO  traMADol (ULTRAM) 50 MG tablet Take 50 mg by mouth 4 (four) times daily.    Yes Historical Provider, MD  EPINEPHrine (EPI-PEN) 0.3 mg/0.3 mL DEVI Inject 0.3 mg into the muscle as needed (for allergic reaction).     Historical  Provider, MD  ondansetron (ZOFRAN) 4 MG tablet Take 1 tablet (4 mg total) by mouth every 6 (six) hours as needed for nausea. 02/21/14   Danie Binder, MD   BP 150/82  Pulse 72  Temp(Src) 97.9 F (36.6 C) (Oral)  Resp 20  SpO2 100% Physical Exam 1915: Physical examination:  Nursing notes reviewed; Vital signs and O2 SAT reviewed;  Constitutional: Well developed, Well nourished, Well hydrated, In no acute distress; Head:  Normocephalic, atraumatic; Eyes: EOMI, PERRL, No scleral icterus. No conjunctival erythema or chemosis. No discharge.; ENMT: Mouth and pharynx normal, Mucous membranes moist. +hives on central face, mild swelling to bilat  periorbital areas. No open wounds. Mouth and pharynx without lesions. No tonsillar exudates. No intra-oral edema. No submandibular or sublingual edema. No hoarse voice, no drooling, no stridor. No pain with manipulation of larynx. No trismus.; Neck: Supple, Full range of motion, No lymphadenopathy; Cardiovascular: Regular rate and rhythm, No murmur, rub, or gallop; Respiratory: Breath sounds clear & equal bilaterally, No rales, rhonchi, wheezes.  Speaking full sentences with ease, Normal respiratory effort/excursion; Chest: Nontender, Movement normal; Abdomen: Soft, Nontender, Nondistended, Normal bowel sounds; Genitourinary: No CVA tenderness; Extremities: Pulses normal, No tenderness, No edema, No calf edema or asymmetry.; Neuro: AA&Ox3, Major CN grossly intact.  Speech clear. No gross focal motor or sensory deficits in extremities.; Skin: Color normal, Warm, Dry.   ED Course  Procedures     MDM  MDM Reviewed: previous chart, nursing note and vitals    2155:  Hives and edema now appear improved after meds and pt wants to go home. VS remain stable, Sats 100% R/A, resps easy, no intra-oral edema. States she "still feels a little itchy" despite benadryl. Will rx hydroxyzine and prednisone. Dx and testing d/w pt and family.  Questions answered.  Verb  understanding, agreeable to d/c home with outpt f/u.      Francine Graven, DO 08/13/14 1422

## 2014-08-19 ENCOUNTER — Emergency Department (HOSPITAL_COMMUNITY)
Admission: EM | Admit: 2014-08-19 | Discharge: 2014-08-19 | Disposition: A | Discharge status: Home / Self Care | Payer: Managed Care, Other (non HMO) | Attending: Emergency Medicine | Admitting: Emergency Medicine

## 2014-08-19 ENCOUNTER — Encounter (HOSPITAL_COMMUNITY): Payer: Self-pay | Admitting: Emergency Medicine

## 2014-08-19 DIAGNOSIS — Z79899 Other long term (current) drug therapy: Secondary | ICD-10-CM | POA: Insufficient documentation

## 2014-08-19 DIAGNOSIS — G40909 Epilepsy, unspecified, not intractable, without status epilepticus: Secondary | ICD-10-CM | POA: Diagnosis not present

## 2014-08-19 DIAGNOSIS — Z7952 Long term (current) use of systemic steroids: Secondary | ICD-10-CM | POA: Insufficient documentation

## 2014-08-19 DIAGNOSIS — Z880 Allergy status to penicillin: Secondary | ICD-10-CM | POA: Insufficient documentation

## 2014-08-19 DIAGNOSIS — F419 Anxiety disorder, unspecified: Secondary | ICD-10-CM | POA: Insufficient documentation

## 2014-08-19 DIAGNOSIS — Z8719 Personal history of other diseases of the digestive system: Secondary | ICD-10-CM | POA: Insufficient documentation

## 2014-08-19 DIAGNOSIS — Z87891 Personal history of nicotine dependence: Secondary | ICD-10-CM | POA: Insufficient documentation

## 2014-08-19 DIAGNOSIS — R220 Localized swelling, mass and lump, head: Secondary | ICD-10-CM | POA: Diagnosis present

## 2014-08-19 DIAGNOSIS — G43909 Migraine, unspecified, not intractable, without status migrainosus: Secondary | ICD-10-CM | POA: Diagnosis not present

## 2014-08-19 DIAGNOSIS — L509 Urticaria, unspecified: Secondary | ICD-10-CM | POA: Diagnosis not present

## 2014-08-19 DIAGNOSIS — G8929 Other chronic pain: Secondary | ICD-10-CM | POA: Diagnosis not present

## 2014-08-19 MED ORDER — FAMOTIDINE 20 MG PO TABS: 20.0000 mg | ORAL_TABLET | Freq: Every day | ORAL | Status: DC

## 2014-08-19 MED ORDER — PREDNISONE 20 MG PO TABS
60.0000 mg | ORAL_TABLET | Freq: Once | ORAL | Status: AC
  Administered 2014-08-19: 60 mg via ORAL
  Filled 2014-08-19: qty 3

## 2014-08-19 MED ORDER — FAMOTIDINE 20 MG PO TABS
20.0000 mg | ORAL_TABLET | Freq: Once | ORAL | Status: AC
  Administered 2014-08-19: 20 mg via ORAL
  Filled 2014-08-19: qty 1

## 2014-08-19 MED ORDER — PREDNISONE 10 MG PO TABS: ORAL_TABLET | ORAL | Status: DC

## 2014-08-19 NOTE — ED Provider Notes (Signed)
CSN: [id number]     Arrival date & time 08/19/14  1101 History   First MD Initiated Contact with Patient 08/19/14 1112     Chief Complaint  Patient presents with  . Allergic Reaction  . Facial Swelling  . lips swollen      (Consider location/radiation/quality/duration/timing/severity/associated sxs/prior Treatment) HPI  This is a 53 are old female who presents with facial swelling and rash. Patient reports that this is her third visit to the ER for the same thing. She has been on 2 courses of prednisone. She states when she finishes the prednisone the rash comes back.  She reports bilateral eye swelling. Her face is itchy. Patient states over the last 2 days she has had worsening rash since finishing her last course of prednisone. She denies any shortness of breath, throat swelling. She states that she feels like her skin is peeling. She has a appointment with a allergist on Thursday. She denies any new exposures including medications, foods, detergents, soaps.  Past Medical History  Diagnosis Date  . Environmental allergies   . Anxiety   . Family history of anesthesia complication     MH during C Section  . 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 Complication of anesthesia   . H/O colonoscopy   . Migraine   . Crohn's disease June 2013  . IBS (irritable bowel syndrome) June 2014  . Obsessive-compulsive disorder   . PTSD (post-traumatic stress disorder)   . Seizures   . Chronic abdominal pain   . Chronic headaches   . History of electroencephalogram 03/2014    normal   Past Surgical History  Procedure Laterality Date  . Partial hysterectomy    . Tubal ligation    . Cesarean section      X2  . Bladder surgery      age 53  . Esophagogastroduodenoscopy      remote, had ulcers, [place], Dr. Truddie Coco  . Colonoscopy  05/22/2012    SLF: Polyps, multiple hyperplastic in the sigmoid colon/Polyp in the rectum/  MODERATE Diverticulosis  throughout the colon/ Internal hemorrhoids  . Esophagogastroduodenoscopy  05/22/2012    SLF: Stricture in the distal esophagus/Moderate gastritis   Family History  Problem Relation Age of Onset  . Colon cancer Maternal Grandfather     ?less than age 85s  . Heart attack Father   . Alcohol abuse Father   . Diabetes Mother   . Hypertension Mother   . Dementia Mother   . Depression Mother   . Inflammatory bowel disease Neg Hx   . ADD / ADHD Neg Hx   . Drug abuse Neg Hx   . Paranoid behavior Neg Hx   . Schizophrenia Neg Hx   . Seizures Neg Hx   . Sexual abuse Neg Hx   . Physical abuse Neg Hx   . Colon polyps Neg Hx   . Liver disease Brother     does know details, but related to MVA?  Marland Kitchen Anxiety disorder Sister   . Bipolar disorder Sister   . OCD Sister   . Anxiety disorder Sister   . Depression Sister   . Malignant hyperthermia Daughter   . Heart murmur Son    History  Substance Use Topics  . Smoking status: Former Smoker    Quit date: 01/01/1989  . Smokeless tobacco: Never Used     Comment: Quit a few years ago  . Alcohol Use: No  Comment: socially   OB History    No data available     Review of Systems  Constitutional: Negative for fever.  HENT:       Facial swelling  Respiratory: Negative for chest tightness and shortness of breath.   Cardiovascular: Negative for chest pain.  Gastrointestinal: Negative for nausea, vomiting and abdominal pain.  Skin: Positive for rash.  Neurological: Negative for headaches.  All other systems reviewed and are negative.     Allergies  Aspirin; Peanut-containing drug products; Penicillins; Sulfa antibiotics; Codeine; Adhesive; and Rubbing alcohol  Home Medications   Prior to Admission medications   Medication Sig Start Date End Date Taking? Authorizing Provider  albuterol (PROVENTIL HFA;VENTOLIN HFA) 108 (90 BASE) MCG/ACT inhaler Inhale 2 puffs into the lungs every 6 (six) hours as needed for wheezing or shortness of  breath (wheezing).    Yes Historical Provider, MD  ALPRAZolam Duanne Moron) 0.5 MG tablet Take 0.5 mg by mouth 3 (three) times daily as needed for anxiety (anxiety).  09/04/13  Yes Mary-Margaret Hassell Done, FNP  busPIRone (BUSPAR) 15 MG tablet Take 1 tablet (15 mg total) by mouth 4 (four) times daily. 09/04/13  Yes Mary-Margaret Hassell Done, FNP  Carbamazepine (EQUETRO) 100 MG CP12 12 hr capsule Take 100 mg by mouth 2 (two) times daily.   Yes Historical Provider, MD  dexlansoprazole (DEXILANT) 60 MG capsule Take 60 mg by mouth daily.   Yes Historical Provider, MD  diphenhydrAMINE (BENADRYL) 25 MG tablet Take 25 mg by mouth every 6 (six) hours as needed for itching (itching).   Yes Historical Provider, MD  DULoxetine (CYMBALTA) 60 MG capsule Take 60 mg by mouth daily.   Yes Historical Provider, MD  esomeprazole (NEXIUM) 40 MG capsule Take 40 mg by mouth daily at 12 noon.   Yes Historical Provider, MD  hydrOXYzine (ATARAX/VISTARIL) 25 MG tablet Take 1 tablet (25 mg total) by mouth every 6 (six) hours as needed for itching. 08/11/14  Yes Francine Graven, DO  levocetirizine (XYZAL) 5 MG tablet Take 5 mg by mouth every evening.   Yes Historical Provider, MD  Linaclotide (LINZESS) 290 MCG CAPS capsule Take 290 mcg by mouth daily. 07/04/14  Yes Danie Binder, MD  ondansetron (ZOFRAN) 4 MG tablet Take 1 tablet (4 mg total) by mouth every 6 (six) hours as needed for nausea. 02/21/14  Yes Danie Binder, MD  topiramate (TOPAMAX) 100 MG tablet Take 100 mg by mouth 2 (two) times daily.   Yes Historical Provider, MD  topiramate (TOPAMAX) 25 MG tablet Take 1 tablet (25 mg total) by mouth 2 (two) times daily. 04/16/14  Yes Hulen Luster, DO  traMADol (ULTRAM) 50 MG tablet Take 50 mg by mouth 4 (four) times daily.    Yes Historical Provider, MD  EPINEPHrine (EPI-PEN) 0.3 mg/0.3 mL DEVI Inject 0.3 mg into the muscle as needed (for allergic reaction).     Historical Provider, MD  famotidine (PEPCID) 20 MG tablet Take 1 tablet  (20 mg total) by mouth daily. 08/19/14   Merryl Hacker, MD  predniSONE (DELTASONE) 10 MG tablet Take 50 mg by mouth for 3 days, then take 40 mg by mouth for 3 days, then take 30 mg by mouth for 3 days, then take 20 mg by mouth for 3 days, then take 10 mg by mouth for 3 days, then take 5 mg by mouth for 3 days 08/19/14   Merryl Hacker, MD   BP 143/80 mmHg  Pulse 77  Temp(Src) 97.8  F (36.6 C) (Oral)  Resp 19  SpO2 98% Physical Exam  Constitutional: She is oriented to person, place, and time.  HENT:  Head: Normocephalic and atraumatic.  Mouth/Throat: Oropharynx is clear and moist.  Diffuse swelling of the bilateral eyes, there is patchy erythema and urticaria over the nasal bridge and extending into the bilateral cheeks, no oropharyngeal involvement noted, no oropharyngeal swelling noted  Eyes: Pupils are equal, round, and reactive to light.  Neck: Normal range of motion. Neck supple.  Cardiovascular: Normal rate, regular rhythm and normal heart sounds.   No murmur heard. Pulmonary/Chest: Effort normal and breath sounds normal. No stridor. No respiratory distress. She has no wheezes.  Musculoskeletal: She exhibits no edema.  Neurological: She is alert and oriented to person, place, and time.  Skin: Skin is warm and dry.  See HEENT above  Psychiatric: She has a normal mood and affect.  Nursing note and vitals reviewed.   ED Course  Procedures (including critical care time) Labs Review Labs Reviewed - No data to display  Imaging Review No results found.   EKG Interpretation None      MDM   Final diagnoses:  Hives   Patient presents with facial swelling and hives. Patient reports recent history of similar symptoms that have improved with steroids but come back following discontinuation of steroids. She has not toxic. No evidence of airway compromise or shortness of breath.  No evidence of anaphylaxis. There does not appear to be any oropharyngeal involvement suggestive  of Stevens-Johnson's or other mucocutaneous cause. Most consistent with hives. Discussed with patient and placing on a longer prednisone taper. Feel it is appropriate that she has outpatient allergist follow-up at this time. Encouraged patient continue Benadryl and Pepcid at home as well as steroids. Patient was given strict return precautions.  After history, exam, and medical workup I feel the patient has been appropriately medically screened and is safe for discharge home. Pertinent diagnoses were discussed with the patient. Patient was given return precautions.    Merryl Hacker, MD 08/19/14 1228

## 2014-08-19 NOTE — Discharge Instructions (Signed)
Hives Hives are itchy, red, swollen areas of the skin. They can vary in size and location on your body. Hives can come and go for hours or several days (acute hives) or for several weeks (chronic hives). Hives do not spread from person to person (noncontagious). They may get worse with scratching, exercise, and emotional stress. CAUSES   Allergic reaction to food, additives, or drugs.  Infections, including the common cold.  Illness, such as vasculitis, lupus, or thyroid disease.  Exposure to sunlight, heat, or cold.  Exercise.  Stress.  Contact with chemicals. SYMPTOMS   Red or white swollen patches on the skin. The patches may change size, shape, and location quickly and repeatedly.  Itching.  Swelling of the hands, feet, and face. This may occur if hives develop deeper in the skin. DIAGNOSIS  Your caregiver can usually tell what is wrong by performing a physical exam. Skin or blood tests may also be done to determine the cause of your hives. In some cases, the cause cannot be determined. TREATMENT  Mild cases usually get better with medicines such as antihistamines. Severe cases may require an emergency epinephrine injection. If the cause of your hives is known, treatment includes avoiding that trigger.  HOME CARE INSTRUCTIONS   Avoid causes that trigger your hives.  Take antihistamines as directed by your caregiver to reduce the severity of your hives. Non-sedating or low-sedating antihistamines are usually recommended. Do not drive while taking an antihistamine.  Take any other medicines prescribed for itching as directed by your caregiver.  Wear loose-fitting clothing.  Keep all follow-up appointments as directed by your caregiver. SEEK MEDICAL CARE IF:   You have persistent or severe itching that is not relieved with medicine.  You have painful or swollen joints. SEEK IMMEDIATE MEDICAL CARE IF:   You have a fever.  Your tongue or lips are swollen.  You have  trouble breathing or swallowing.  You feel tightness in the throat or chest.  You have abdominal pain. These problems may be the first sign of a life-threatening allergic reaction. Call your local emergency services (911 in [place] MAKE SURE YOU:   Understand these instructions.  Will watch your condition.  Will get help right away if you are not doing well or get worse. Document Released: 10/04/2005 Document Revised: 10/09/2013 Document Reviewed: 12/28/2011 Exit[employer] Patient Information 2015 [employer]. This information is not intended to replace advice given to you by your health care provider. Make sure you discuss any questions you have with your health care provider.  

## 2014-08-19 NOTE — ED Notes (Signed)
Pt c/o allergic reaction for 3 days now. Pt eyes swollen, lips swollen and states that she can start to feel bumps inside her mouth.  Pt has vaseline on her face around her eyes bc she states that her skin is peeling.  Pt doesn't know what caused this reaction.

## 2014-10-14 ENCOUNTER — Other Ambulatory Visit: Payer: Self-pay

## 2014-10-14 MED ORDER — ESOMEPRAZOLE MAGNESIUM 40 MG PO CPDR: 40.0000 mg | DELAYED_RELEASE_CAPSULE | Freq: Every day | ORAL | Status: DC

## 2014-12-16 ENCOUNTER — Encounter: Payer: Self-pay | Admitting: Gastroenterology

## 2015-01-12 ENCOUNTER — Encounter (HOSPITAL_COMMUNITY): Payer: Self-pay

## 2015-01-12 ENCOUNTER — Emergency Department (HOSPITAL_COMMUNITY)
Admission: EM | Admit: 2015-01-12 | Discharge: 2015-01-12 | Disposition: A | Discharge status: Home / Self Care | Payer: Managed Care, Other (non HMO) | Attending: Emergency Medicine | Admitting: Emergency Medicine

## 2015-01-12 DIAGNOSIS — G43909 Migraine, unspecified, not intractable, without status migrainosus: Secondary | ICD-10-CM | POA: Diagnosis not present

## 2015-01-12 DIAGNOSIS — Z9889 Other specified postprocedural states: Secondary | ICD-10-CM | POA: Insufficient documentation

## 2015-01-12 DIAGNOSIS — Z87891 Personal history of nicotine dependence: Secondary | ICD-10-CM | POA: Insufficient documentation

## 2015-01-12 DIAGNOSIS — Z7952 Long term (current) use of systemic steroids: Secondary | ICD-10-CM | POA: Diagnosis not present

## 2015-01-12 DIAGNOSIS — B349 Viral infection, unspecified: Secondary | ICD-10-CM | POA: Diagnosis not present

## 2015-01-12 DIAGNOSIS — Z79899 Other long term (current) drug therapy: Secondary | ICD-10-CM | POA: Insufficient documentation

## 2015-01-12 DIAGNOSIS — G8929 Other chronic pain: Secondary | ICD-10-CM | POA: Insufficient documentation

## 2015-01-12 DIAGNOSIS — Z91040 Latex allergy status: Secondary | ICD-10-CM | POA: Diagnosis not present

## 2015-01-12 DIAGNOSIS — K589 Irritable bowel syndrome without diarrhea: Secondary | ICD-10-CM | POA: Diagnosis not present

## 2015-01-12 DIAGNOSIS — F419 Anxiety disorder, unspecified: Secondary | ICD-10-CM | POA: Insufficient documentation

## 2015-01-12 DIAGNOSIS — Z880 Allergy status to penicillin: Secondary | ICD-10-CM | POA: Insufficient documentation

## 2015-01-12 DIAGNOSIS — R42 Dizziness and giddiness: Secondary | ICD-10-CM | POA: Diagnosis present

## 2015-01-12 MED ORDER — OXYCODONE-ACETAMINOPHEN 5-325 MG PO TABS
1.0000 | ORAL_TABLET | Freq: Once | ORAL | Status: AC
  Administered 2015-01-12: 1 via ORAL
  Filled 2015-01-12: qty 1

## 2015-01-12 MED ORDER — OXYCODONE-ACETAMINOPHEN 5-325 MG PO TABS: 1.0000 | ORAL_TABLET | Freq: Four times a day (QID) | ORAL | Status: DC | PRN

## 2015-01-12 NOTE — ED Notes (Signed)
I hurt all over, my head hurts, and I having a hard time breathing with a head cold and head congestion per pt. I feel real light headed, like I am going to pass out per pt. My veins hurt in my hand per pt. Denies cough, unknown if running a fever.

## 2015-01-12 NOTE — Discharge Instructions (Signed)

## 2015-01-12 NOTE — ED Provider Notes (Signed)
CSN: [id number]     Arrival date & time 01/12/15  2058 History   First MD Initiated Contact with Patient 01/12/15 2117     Chief Complaint  Patient presents with  . URI  . Dizziness     (Consider location/radiation/quality/duration/timing/severity/associated sxs/prior Treatment) Patient is a 54 y.o. female presenting with URI and dizziness. The history is provided by the patient.  URI Presenting symptoms: congestion and rhinorrhea   Associated symptoms: myalgias   Associated symptoms: no headaches   Dizziness Associated symptoms: no chest pain, no diarrhea, no headaches, no nausea, no shortness of breath, no vomiting and no weakness    patient presents with head congestion and pain in the front of her face. States she aches all over. Husband has had URI recently. Patient is without cough. No fevers. No chest pain. Slight nausea. No vomiting.headache is dull. States she does have allergies also. Has a history of migraines and has a headache but states this feels differently.  Past Medical History  Diagnosis Date  . Environmental allergies   . Anxiety   . Family history of anesthesia complication     MH during C Section  . 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 Complication of anesthesia   . H/O colonoscopy   . Migraine   . Crohn's disease June 2013  . IBS (irritable bowel syndrome) June 2014  . Obsessive-compulsive disorder   . PTSD (post-traumatic stress disorder)   . Seizures   . Chronic abdominal pain   . Chronic headaches   . History of electroencephalogram 03/2014    normal   Past Surgical History  Procedure Laterality Date  . Partial hysterectomy    . Tubal ligation    . Cesarean section      X2  . Bladder surgery      age 26  . Esophagogastroduodenoscopy      remote, had ulcers, [place], Dr. Truddie Coco  . Colonoscopy  05/22/2012    SLF: Polyps, multiple hyperplastic in the sigmoid colon/Polyp in the rectum/  MODERATE  Diverticulosis throughout the colon/ Internal hemorrhoids  . Esophagogastroduodenoscopy  05/22/2012    SLF: Stricture in the distal esophagus/Moderate gastritis   Family History  Problem Relation Age of Onset  . Colon cancer Maternal Grandfather     ?less than age 39s  . Heart attack Father   . Alcohol abuse Father   . Diabetes Mother   . Hypertension Mother   . Dementia Mother   . Depression Mother   . Inflammatory bowel disease Neg Hx   . ADD / ADHD Neg Hx   . Drug abuse Neg Hx   . Paranoid behavior Neg Hx   . Schizophrenia Neg Hx   . Seizures Neg Hx   . Sexual abuse Neg Hx   . Physical abuse Neg Hx   . Colon polyps Neg Hx   . Liver disease Brother     does know details, but related to MVA?  Marland Kitchen Anxiety disorder Sister   . Bipolar disorder Sister   . OCD Sister   . Anxiety disorder Sister   . Depression Sister   . Malignant hyperthermia Daughter   . Heart murmur Son    History  Substance Use Topics  . Smoking status: Former Smoker    Quit date: 01/01/1989  . Smokeless tobacco: Never Used     Comment: Quit a few years ago  . Alcohol Use: No  Comment: socially   OB History    No data available     Review of Systems  Constitutional: Negative for activity change and appetite change.  HENT: Positive for congestion and rhinorrhea. Negative for dental problem.   Eyes: Negative for pain.  Respiratory: Negative for chest tightness and shortness of breath.   Cardiovascular: Negative for chest pain and leg swelling.  Gastrointestinal: Negative for nausea, vomiting, abdominal pain and diarrhea.  Genitourinary: Negative for flank pain.  Musculoskeletal: Positive for myalgias. Negative for back pain and neck stiffness.  Skin: Negative for rash.  Neurological: Positive for dizziness and light-headedness. Negative for weakness, numbness and headaches.  Psychiatric/Behavioral: Negative for behavioral problems.      Allergies  Aspirin; Peanut-containing drug products;  Penicillins; Sulfa antibiotics; Codeine; Adhesive; Latex; and Rubbing alcohol  Home Medications   Prior to Admission medications   Medication Sig Start Date End Date Taking? Authorizing Provider  albuterol (PROVENTIL HFA;VENTOLIN HFA) 108 (90 BASE) MCG/ACT inhaler Inhale 2 puffs into the lungs every 6 (six) hours as needed for wheezing or shortness of breath (wheezing).     Historical Provider, MD  ALPRAZolam Duanne Moron) 0.5 MG tablet Take 0.5 mg by mouth 3 (three) times daily as needed for anxiety (anxiety).  09/04/13   Mary-Margaret Hassell Done, FNP  busPIRone (BUSPAR) 15 MG tablet Take 1 tablet (15 mg total) by mouth 4 (four) times daily. 09/04/13   Mary-Margaret Hassell Done, FNP  Carbamazepine (EQUETRO) 100 MG CP12 12 hr capsule Take 100 mg by mouth 2 (two) times daily.    Historical Provider, MD  dexlansoprazole (DEXILANT) 60 MG capsule Take 60 mg by mouth daily.    Historical Provider, MD  diphenhydrAMINE (BENADRYL) 25 MG tablet Take 25 mg by mouth every 6 (six) hours as needed for itching (itching).    Historical Provider, MD  DULoxetine (CYMBALTA) 60 MG capsule Take 60 mg by mouth daily.    Historical Provider, MD  EPINEPHrine (EPI-PEN) 0.3 mg/0.3 mL DEVI Inject 0.3 mg into the muscle as needed (for allergic reaction).     Historical Provider, MD  esomeprazole (NEXIUM) 40 MG capsule Take 1 capsule (40 mg total) by mouth daily. 10/14/14   Orvil Feil, NP  famotidine (PEPCID) 20 MG tablet Take 1 tablet (20 mg total) by mouth daily. 08/19/14   Merryl Hacker, MD  hydrOXYzine (ATARAX/VISTARIL) 25 MG tablet Take 1 tablet (25 mg total) by mouth every 6 (six) hours as needed for itching. 08/11/14   Francine Graven, DO  levocetirizine (XYZAL) 5 MG tablet Take 5 mg by mouth every evening.    Historical Provider, MD  Linaclotide Rolan Lipa) 290 MCG CAPS capsule Take 290 mcg by mouth daily. 07/04/14   Danie Binder, MD  ondansetron (ZOFRAN) 4 MG tablet Take 1 tablet (4 mg total) by mouth every 6 (six) hours as  needed for nausea. 02/21/14   Danie Binder, MD  oxyCODONE-acetaminophen (PERCOCET/ROXICET) 5-325 MG per tablet Take 1-2 tablets by mouth every 6 (six) hours as needed for severe pain. 01/12/15   Davonna Belling, MD  predniSONE (DELTASONE) 10 MG tablet Take 50 mg by mouth for 3 days, then take 40 mg by mouth for 3 days, then take 30 mg by mouth for 3 days, then take 20 mg by mouth for 3 days, then take 10 mg by mouth for 3 days, then take 5 mg by mouth for 3 days 08/19/14   Merryl Hacker, MD  topiramate (TOPAMAX) 100 MG tablet Take 100 mg by  mouth 2 (two) times daily.    Historical Provider, MD  topiramate (TOPAMAX) 25 MG tablet Take 1 tablet (25 mg total) by mouth 2 (two) times daily. 04/16/14   Drema Dallas, DO  traMADol (ULTRAM) 50 MG tablet Take 50 mg by mouth 4 (four) times daily.     Historical Provider, MD   BP 150/85 mmHg  Pulse 81  Temp(Src) 98.7 F (37.1 C) (Oral)  Resp 24  Ht 5' 2"  (1.575 m)  Wt 204 lb (92.534 kg)  BMI 37.30 kg/m2  SpO2 99% Physical Exam  Constitutional: She appears well-developed.  HENT:  Head: Normocephalic.  Bilateral TMs normal. Some pain over bridge of nose. Nasal mucosa somewhat swollen bilaterally. No tenderness over sinuses. Extraocular movements intact. Pupils reactive.  Neck: Normal range of motion.  Cardiovascular: Normal rate and regular rhythm.   Pulmonary/Chest: Effort normal and breath sounds normal.  Abdominal: Soft.  Musculoskeletal: Normal range of motion.  Neurological: She is alert.  Skin: Skin is warm.    ED Course  Procedures (including critical care time) Labs Review Labs Reviewed - No data to display  Imaging Review No results found.   EKG Interpretation None      MDM   Final diagnoses:  Viral infection    Patient is likely viral infection. Pain over the bridge of nose. Feels bad. Has myalgias. Not orthostatic. Lungs are reassuring. No tenderness over sinuses. Will discharge home to follow-up as  needed.    Davonna Belling, MD 01/12/15 [phone]

## 2015-02-06 ENCOUNTER — Other Ambulatory Visit: Payer: Self-pay

## 2015-02-06 ENCOUNTER — Ambulatory Visit (INDEPENDENT_AMBULATORY_CARE_PROVIDER_SITE_OTHER): Payer: Managed Care, Other (non HMO) | Admitting: Gastroenterology

## 2015-02-06 ENCOUNTER — Encounter: Payer: Self-pay | Admitting: Gastroenterology

## 2015-02-06 VITALS — BP 123/87 | HR 80 | Temp 97.6°F | Ht 62.0 in | Wt 212.8 lb

## 2015-02-06 DIAGNOSIS — K589 Irritable bowel syndrome without diarrhea: Secondary | ICD-10-CM

## 2015-02-06 DIAGNOSIS — M549 Dorsalgia, unspecified: Secondary | ICD-10-CM

## 2015-02-06 DIAGNOSIS — R634 Abnormal weight loss: Secondary | ICD-10-CM

## 2015-02-06 DIAGNOSIS — K219 Gastro-esophageal reflux disease without esophagitis: Secondary | ICD-10-CM | POA: Diagnosis not present

## 2015-02-06 DIAGNOSIS — R109 Unspecified abdominal pain: Secondary | ICD-10-CM

## 2015-02-06 NOTE — Assessment & Plan Note (Signed)
SYMPTOMS FAIRLY WELL CONTROLLED ON DEXILANT.  AVOID TRIGGERS. CONTINUE DEXILANT FOLLOW UP IN 6 MOS.

## 2015-02-06 NOTE — Patient Instructions (Signed)
I WILL INITIATE THE PHYSICAL THERAPY REFERRAL.  USE YOUR WALKER AT HOME AND WHEN OUT AND ABOUT.  CONTINUE DEXILANT.  AVOID FOOD THAT TRIGGERS REFLUX. SEE INFO BELOW.   CONTINUE YOUR WEIGHT LOSS EFFORTS. YOU WERE 189 LBS LAST MAY AND NOW YOU ARE 212 LBS.  FOLLOW UP IN 6 MOS.    Lifestyle and home remedies TO MANAGE REFLUX/CHEST PAIN  You may eliminate or reduce the frequency of heartburn by making the following lifestyle changes:  . Control your weight. Being overweight is a major risk factor for heartburn and GERD. Excess pounds put pressure on your abdomen, pushing up your stomach and causing acid to back up into your esophagus.   . Eat smaller meals. 4 TO 6 MEALS A DAY. This reduces pressure on the lower esophageal sphincter, helping to prevent the valve from opening and acid from washing back into your esophagus.   Dolphus Jenny your belt. Clothes that fit tightly around your waist put pressure on your abdomen and the lower esophageal sphincter.   . Eliminate heartburn triggers. Everyone has specific triggers. Common triggers such as fatty or fried foods, spicy food, tomato sauce, carbonated beverages, alcohol, chocolate, mint, garlic, onion, caffeine and nicotine may make heartburn worse.   Marland Kitchen Avoid stooping or bending. Tying your shoes is OK. Bending over for longer periods to weed your garden isn't, especially soon after eating.   . Don't lie down after a meal. Wait at least three to four hours after eating before going to bed, and don't lie down right after eating.   Marland Kitchen PUT THE HEAD OF YOUR BED ON 6 INCH BLOCKS.   Alternative medicine . Several home remedies exist for treating GERD, but they provide only temporary relief. They include drinking baking soda (sodium bicarbonate) added to water or drinking other fluids such as baking soda mixed with cream of tartar and water.  . Although these liquids create temporary relief by neutralizing, washing away or buffering acids,  eventually they aggravate the situation by adding gas and fluid to your stomach, increasing pressure and causing more acid reflux. Further, adding more sodium to your diet may increase your blood pressure and add stress to your heart, and excessive bicarbonate ingestion can alter the acid-base balance in your body.      Low-Fat Diet BREADS, CEREALS, PASTA, RICE, DRIED PEAS, AND BEANS These products are high in carbohydrates and most are low in fat. Therefore, they can be increased in the diet as substitutes for fatty foods. They too, however, contain calories and should not be eaten in excess. Cereals can be eaten for snacks as well as for breakfast.   FRUITS AND VEGETABLES It is good to eat fruits and vegetables. Besides being sources of fiber, both are rich in vitamins and some minerals. They help you get the daily allowances of these nutrients. Fruits and vegetables can be used for snacks and desserts.  MEATS Limit lean meat, chicken, [place], and fish to no more than 6 ounces per day. Beef, Pork, and Lamb Use lean cuts of beef, pork, and lamb. Lean cuts include:  Extra-lean ground beef.  Arm roast.  Sirloin tip.  Center-cut ham.  Round steak.  Loin chops.  Rump roast.  Tenderloin.  Trim all fat off the outside of meats before cooking. It is not necessary to severely decrease the intake of red meat, but lean choices should be made. Lean meat is rich in protein and contains a highly absorbable form of iron. Premenopausal women, in  particular, should avoid reducing lean red meat because this could increase the risk for low red blood cells (iron-deficiency anemia).  Chicken and [place] These are good sources of protein. The fat of poultry can be reduced by removing the skin and underlying fat layers before cooking. Chicken and [place] can be substituted for lean red meat in the diet. Poultry should not be fried or covered with high-fat sauces. Fish and Shellfish Fish is a good source of  protein. Shellfish contain cholesterol, but they usually are low in saturated fatty acids. The preparation of fish is important. Like chicken and [place], they should not be fried or covered with high-fat sauces. EGGS Egg whites contain no fat or cholesterol. They can be eaten often. Try 1 to 2 egg whites instead of whole eggs in recipes or use egg substitutes that do not contain yolk. MILK AND DAIRY PRODUCTS Use skim or 1% milk instead of 2% or whole milk. Decrease whole milk, natural, and processed cheeses. Use nonfat or low-fat (2%) cottage cheese or low-fat cheeses made from vegetable oils. Choose nonfat or low-fat (1 to 2%) yogurt. Experiment with evaporated skim milk in recipes that call for heavy cream. Substitute low-fat yogurt or low-fat cottage cheese for sour cream in dips and salad dressings. Have at least 2 servings of low-fat dairy products, such as 2 glasses of skim (or 1%) milk each day to help get your daily calcium intake. FATS AND OILS Reduce the total intake of fats, especially saturated fat. Butterfat, lard, and beef fats are high in saturated fat and cholesterol. These should be avoided as much as possible. Vegetable fats do not contain cholesterol, but certain vegetable fats, such as coconut oil, palm oil, and palm kernel oil are very high in saturated fats. These should be limited. These fats are often used in bakery goods, processed foods, popcorn, oils, and nondairy creamers. Vegetable shortenings and some peanut butters contain hydrogenated oils, which are also saturated fats. Read the labels on these foods and check for saturated vegetable oils. Unsaturated vegetable oils and fats do not raise blood cholesterol. However, they should be limited because they are fats and are high in calories. Total fat should still be limited to 30% of your daily caloric intake. Desirable liquid vegetable oils are corn oil, cottonseed oil, olive oil, canola oil, safflower oil, soybean oil, and  sunflower oil. Peanut oil is not as good, but small amounts are acceptable. Buy a heart-healthy tub margarine that has no partially hydrogenated oils in the ingredients. Mayonnaise and salad dressings often are made from unsaturated fats, but they should also be limited because of their high calorie and fat content. Seeds, nuts, peanut butter, olives, and avocados are high in fat, but the fat is mainly the unsaturated type. These foods should be limited mainly to avoid excess calories and fat. OTHER EATING TIPS Snacks  Most sweets should be limited as snacks. They tend to be rich in calories and fats, and their caloric content outweighs their nutritional value. Some good choices in snacks are graham crackers, melba toast, soda crackers, bagels (no egg), English muffins, fruits, and vegetables. These snacks are preferable to snack crackers, [place] fries, TORTILLA CHIPS, and POTATO chips. Popcorn should be air-popped or cooked in small amounts of liquid vegetable oil. Desserts Eat fruit, low-fat yogurt, and fruit ices instead of pastries, cake, and cookies. Sherbet, angel food cake, gelatin dessert, frozen low-fat yogurt, or other frozen products that do not contain saturated fat (pure fruit juice bars, frozen  ice pops) are also acceptable.  COOKING METHODS Choose those methods that use little or no fat. They include: Poaching.  Braising.  Steaming.  Grilling.  Baking.  Stir-frying.  Broiling.  Microwaving.  Foods can be cooked in a nonstick pan without added fat, or use a nonfat cooking spray in regular cookware. Limit fried foods and avoid frying in saturated fat. Add moisture to lean meats by using water, broth, cooking wines, and other nonfat or low-fat sauces along with the cooking methods mentioned above. Soups and stews should be chilled after cooking. The fat that forms on top after a few hours in the refrigerator should be skimmed off. When preparing meals, avoid using excess salt. Salt can  contribute to raising blood pressure in some people.  EATING AWAY FROM HOME Order entres, potatoes, and vegetables without sauces or butter. When meat exceeds the size of a deck of cards (3 to 4 ounces), the rest can be taken home for another meal. Choose vegetable or fruit salads and ask for low-calorie salad dressings to be served on the side. Use dressings sparingly. Limit high-fat toppings, such as bacon, crumbled eggs, cheese, sunflower seeds, and olives. Ask for heart-healthy tub margarine instead of butter.

## 2015-02-06 NOTE — Progress Notes (Signed)
cc'ed to pcp °

## 2015-02-06 NOTE — Progress Notes (Signed)
ON RECALL LIST  °

## 2015-02-06 NOTE — Progress Notes (Signed)
Subjective:    Patient ID: Kathryn Olson, female    DOB: 31-May-1961, 54 y.o.   MRN: [id number]  Arrey, DO  HPI USE WALKER ONLY WHEN SHE IS OUT. WALKS ON TREAD MILL. NO FREE WEIGHTS. L FLANK PAIN: THROBBING/SORE, ALL THE TIME. KEEPS HER AWAKE AT NIGHT. TAKES SO MUCH PAIN MEDICINE. NO CHANGE WITH FOOD OR BOWEL MOVEMENTS. BMs: FIRST ONE IN 3 WEEKS THIS AM(#1 THEN BECAME #4). CHEST PAIN: JUST LEFT SIDE, SAME AS USUAL. SOB: ABOUT THE SAME. RIGHT NOW MID ABD PAIN. NAUSEA: MORE OFTEN SINCE 2 WEEKS SHE HAD THE FLU. LAST DIARRHEA 2 WEEKS AGO. HEARTBURN WHEN HER HUSBAND COOKS. NOW HAS SLEEP APNEA.  PT DENIES FEVER, CHILLS, HEMATOCHEZIA, HEMATEMESIS, vomiting, melena, diarrhea, CHANGE IN BOWEL IN HABITS,  abdominal pain, problems swallowing, OR heartburn or indigestion.   Past Medical History  Diagnosis Date  . Environmental allergies   . Anxiety   . Family history of anesthesia complication     MH during C Section  . 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 Complication of anesthesia   . H/O colonoscopy   . Migraine   . Crohn's disease June 2013  . IBS (irritable bowel syndrome) June 2014  . Obsessive-compulsive disorder   . PTSD (post-traumatic stress disorder)   . Seizures   . Chronic abdominal pain   . Chronic headaches   . History of electroencephalogram 03/2014    normal   Past Surgical History  Procedure Laterality Date  . Partial hysterectomy    . Tubal ligation    . Cesarean section      X2  . Bladder surgery      age 44  . Esophagogastroduodenoscopy      remote, had ulcers, [place], Dr. Truddie Coco  . Colonoscopy  05/22/2012    SLF: Polyps, multiple hyperplastic in the sigmoid colon/Polyp in the rectum/  MODERATE Diverticulosis throughout the colon/ Internal hemorrhoids  . Esophagogastroduodenoscopy  05/22/2012    SLF: Stricture in the distal esophagus/Moderate gastritis   Allergies  Allergen Reactions  . Aspirin Anaphylaxis    . Peanut-Containing Drug Products Anaphylaxis       . Penicillins Anaphylaxis    Stops her heart  . Sulfa Antibiotics Anaphylaxis  . Codeine Itching  . Adhesive [Tape] Rash  . Latex Rash  . Rubbing Alcohol [Alcohol] Rash    Current Outpatient Prescriptions  Medication Sig Dispense Refill  . albuterol (PROVENTIL HFA;VENTOLIN HFA) 108 (90 BASE) MCG/ACT inhaler Inhale 2 puffs into the lungs every 6 (six) hours as needed for wheezing or shortness of breath (wheezing).     . ALPRAZolam (XANAX) 0.5 MG tablet Take 0.5 mg by mouth 3 (three) times daily as needed for anxiety (anxiety).     . busPIRone (BUSPAR) 15 MG tablet Take 1 tablet (15 mg total) by mouth 4 (four) times daily.    . Carbamazepine (EQUETRO) 100 MG CP12 12 hr capsule Take 100 mg by mouth 2 (two) times daily.    Marland Kitchen dexlansoprazole (DEXILANT) 60 MG capsule Take 60 mg by mouth daily.    . divalproex (DEPAKOTE) 500 MG DR tablet Take 500 mg by mouth daily.    . DULoxetine (CYMBALTA) 60 MG capsule Take 60 mg by mouth daily.    Marland Kitchen EPINEPHrine (EPI-PEN) 0.3 mg/0.3 mL DEVI Inject 0.3 mg into the muscle as needed (for allergic reaction).     .      .      .  gabapentin (NEURONTIN) 300 MG capsule Take 300 mg by mouth 3 (three) times daily.    Marland Kitchen HYDROcodone-acetaminophen (NORCO) 10-325 MG per tablet Take 1 tablet by mouth every 6 (six) hours as needed.    . hydrOXYzine (ATARAX/VISTARIL) 25 MG tablet Take 1 tablet (25 mg total) by mouth every 6 (six) hours as needed for itching.    . levocetirizine (XYZAL) 5 MG tablet Take 5 mg by mouth every evening.    . Linaclotide (LINZESS) 290 MCG CAPS capsule Take 290 mcg by mouth daily.    . Olopatadine HCl 0.2 % SOLN Apply 1-2 drops to eye 2 (two) times daily.    Marland Kitchen omalizumab (XOLAIR) 150 MG injection Inject into the skin every 14 (fourteen) days.    . ondansetron (ZOFRAN) 4 MG tablet Take 1 tablet (4 mg total) by mouth every 6 (six) hours as needed for nausea. 1-2X/MO   . QUEtiapine (SEROQUEL) 400  MG tablet Take 400 mg by mouth at bedtime.    . topiramate (TOPAMAX) 100 MG tablet Take 100 mg by mouth 2 (two) times daily.    Marland Kitchen topiramate (TOPAMAX) 25 MG tablet Take 1 tablet (25 mg total) by mouth 2 (two) times daily.    Marland Kitchen UNABLE TO FIND CPAP at bedtime    . diphenhydrAMINE (BENADRYL) 25 MG tablet Take 25 mg by mouth every 6 (six) hours as needed for itching (itching).    .      .      . traMADol (ULTRAM) 50 MG tablet Take 50 mg by mouth 4 (four) times daily.      Review of Systems     Objective:   Physical Exam  Constitutional: She is oriented to person, place, and time. She appears well-developed and well-nourished. No distress.  HENT:  Head: Normocephalic and atraumatic.  Mouth/Throat: Oropharynx is clear and moist. No oropharyngeal exudate.  Eyes: Pupils are equal, round, and reactive to light. No scleral icterus.  Neck: Normal range of motion. Neck supple.  Cardiovascular: Normal rate, regular rhythm and normal heart sounds.   Pulmonary/Chest: Effort normal and breath sounds normal. No respiratory distress.  Abdominal: Soft. Bowel sounds are normal. She exhibits no distension. There is tenderness. There is no rebound and no guarding.  POSITIVE CARNETT'S SIGN IN THE EPIGASTRIUM AND LUQ WITH RAISING LEGS AND HEAD/SHOULDERS OFF THE EXAM TABLE.     Musculoskeletal: She exhibits no edema.  WALKS ASSISTED WITH A WALKER.  Lymphadenopathy:    She has no cervical adenopathy.  Neurological: She is alert and oriented to person, place, and time.  NO  NEW FOCAL DEFICITS   Psychiatric: She has a normal mood and affect.  Vitals reviewed.         Assessment & Plan:

## 2015-02-06 NOTE — Assessment & Plan Note (Signed)
SYMPTOMS FAIRLY WELL CONTROLLED. WEIGHT UP 23 LBS SINCE MAY 2015.  CONTINUE TO MONITOR SYMPTOMS. FOLLOW UP IN 6 MOS.

## 2015-02-06 NOTE — Assessment & Plan Note (Signed)
DUE TO MUSCULOSKELETAL ABDOMINAL WALL PAIN.  PHYSICAL THERAPY REFERRAL. ENCOURAGE PT TO USE WALKER AT HOME. FOLLOW UP IN 6 MOS.

## 2015-03-11 ENCOUNTER — Ambulatory Visit: Payer: Managed Care, Other (non HMO)

## 2015-03-18 ENCOUNTER — Telehealth: Payer: Self-pay

## 2015-03-18 ENCOUNTER — Ambulatory Visit: Payer: Managed Care, Other (non HMO)

## 2015-03-18 NOTE — Telephone Encounter (Signed)
Pt called and states that she went for PT appointment this am. States she already uses her walker at home everyday and didn't understand why PT wanted to do an exam on her. Please clarify what pt needs regarding physical Therapy

## 2015-03-19 NOTE — Telephone Encounter (Signed)
PLEASE CALL PT. I MADE A PHYSICAL THERAPY REFERRAL DUE TO HER MUSCULOSKELETAL ABDOMINAL WALL PAIN.

## 2015-03-20 NOTE — Telephone Encounter (Signed)
PT is aware.

## 2015-04-07 ENCOUNTER — Ambulatory Visit: Payer: Managed Care, Other (non HMO) | Attending: Family Medicine

## 2015-04-07 DIAGNOSIS — R109 Unspecified abdominal pain: Secondary | ICD-10-CM

## 2015-04-07 DIAGNOSIS — R1032 Left lower quadrant pain: Secondary | ICD-10-CM | POA: Insufficient documentation

## 2015-04-07 DIAGNOSIS — M545 Low back pain, unspecified: Secondary | ICD-10-CM

## 2015-04-07 NOTE — Therapy (Addendum)
[place], [place], [address] Phone: [phone]   Fax:  [phone]  Physical Therapy Evaluation/Discharge  Patient Details  Name: Kathryn Olson MRN: [id number] Date of Birth: 02-02-1961 Referring Provider:  Emelda Fear, DO  Encounter Date: 04/07/2015      PT End of Session - 04/07/15 1614    Visit Number 1   PT Start Time 1238   PT Stop Time 1500   PT Time Calculation (min) 142 min   Activity Tolerance Patient limited by pain   Behavior During Therapy [facility] for tasks assessed/performed      Past Medical History  Diagnosis Date  . Environmental allergies   . Anxiety   . Family history of anesthesia complication     MH during C Section  . 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 Complication of anesthesia   . H/O colonoscopy   . Migraine   . Crohn's disease June 2013  . IBS (irritable bowel syndrome) June 2014  . Obsessive-compulsive disorder   . PTSD (post-traumatic stress disorder)   . Seizures   . Chronic abdominal pain   . Chronic headaches   . History of electroencephalogram 03/2014    normal    Past Surgical History  Procedure Laterality Date  . Partial hysterectomy    . Tubal ligation    . Cesarean section      X2  . Bladder surgery      age 68  . Esophagogastroduodenoscopy      remote, had ulcers, [place], Dr. Truddie Coco  . Colonoscopy  05/22/2012    SLF: Polyps, multiple hyperplastic in the sigmoid colon/Polyp in the rectum/  MODERATE Diverticulosis throughout the colon/ Internal hemorrhoids  . Esophagogastroduodenoscopy  05/22/2012    SLF: Stricture in the distal esophagus/Moderate gastritis    There were no vitals filed for this visit.  Visit Diagnosis:  Flank pain  Left lower quadrant pain  Bilateral low back pain without sciatica      Subjective Assessment - 04/07/15 1613    Subjective See FCE   Currently in Pain? Yes   Pain Score 8    Pain Location --  multiple areas   Pain Orientation Left;Right   Pain Type Chronic pain   Pain Onset More than a month ago   Pain Frequency Constant   Multiple Pain Sites Yes      We completed the FCE process but Kathryn Olson was not able to perform enough tasks to even be rated at sedentary level. The FCE will be faxed to Dr Chauncey Reading                                     Plan - 04/07/15 1616    Clinical Impression Statement She was unable to perform enlough of the sedentary tasts to be reated at this level   Consulted and Agree with Plan of Care Patient         Problem List Patient Active Problem List   Diagnosis Date Noted  . Crohn's disease 09/04/2013  . IBS (irritable bowel syndrome) 09/04/2013  . Depression 09/04/2013  . Dysthymic disorder 01/29/2013  . PTSD (post-traumatic stress disorder) 01/29/2013  . Chronic back pain greater than 3 months duration 01/29/2013  . CNS disorder 01/29/2013  . Insomnia secondary to depression with anxiety 01/29/2013  . Insomnia secondary to  chronic pain 01/29/2013  . Constipation 05/10/2012  . Weight loss, abnormal 05/10/2012  . Abdominal pain 05/10/2012  . Vomiting 05/10/2012  . GERD (gastroesophageal reflux disease) 05/10/2012    Darrel Hoover PT 04/07/2015, 4:18 PM  [facility] [address] [place], [place], [address] Phone: [phone]   Fax:  [phone]    PHYSICAL THERAPY DISCHARGE SUMMARY  Visits from Start of Care: 1  Current functional level related to goals / functional outcomes: NA   Remaining deficits: NA  See above   Education / Equipment: NA  Plan:                                                    Patient goals were not met.                                                                                                      ?????   The FCE was not completed and she did not return                                                                                         Darrel Hoover PT   06/24/15    11:39 AM

## 2015-04-07 NOTE — Addendum Note (Signed)
Addended by: Darrel Hoover on: 04/07/2015 04:22 PM   Modules accepted: Orders

## 2015-06-25 ENCOUNTER — Encounter: Payer: Self-pay | Admitting: Gastroenterology

## 2015-07-15 ENCOUNTER — Other Ambulatory Visit: Payer: Self-pay

## 2015-07-15 DIAGNOSIS — Z1231 Encounter for screening mammogram for malignant neoplasm of breast: Secondary | ICD-10-CM

## 2015-07-29 NOTE — Therapy (Signed)
[place], [place], [address] Phone: [phone]   Fax:  [phone]  Physical Therapy Evaluation  Patient Details  Name: Kathryn Olson MRN: [id number] Date of Birth: 1961-04-13 Referring Provider:  Emelda Fear, DO  Encounter Date: 03/18/2015    Past Medical History  Diagnosis Date  . Environmental allergies   . Anxiety   . Family history of anesthesia complication     MH during C Section  . 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 Complication of anesthesia   . H/O colonoscopy   . Migraine   . Crohn's disease June 2013  . IBS (irritable bowel syndrome) June 2014  . Obsessive-compulsive disorder   . PTSD (post-traumatic stress disorder)   . Seizures   . Chronic abdominal pain   . Chronic headaches   . History of electroencephalogram 03/2014    normal    Past Surgical History  Procedure Laterality Date  . Partial hysterectomy    . Tubal ligation    . Cesarean section      X2  . Bladder surgery      age 54  . Esophagogastroduodenoscopy      remote, had ulcers, [place], Dr. Truddie Coco  . Colonoscopy  05/22/2012    SLF: Polyps, multiple hyperplastic in the sigmoid colon/Polyp in the rectum/  MODERATE Diverticulosis throughout the colon/ Internal hemorrhoids  . Esophagogastroduodenoscopy  05/22/2012    SLF: Stricture in the distal esophagus/Moderate gastritis    There were no vitals filed for this visit.  Visit Diagnosis:  Bilateral low back pain without sciatica              FCE not completed as the Physicians order was lost and not found until after the patient had left the building. She will be rescheduled                            Problem List Patient Active Problem List   Diagnosis Date Noted  . Crohn's disease ([place]) 09/04/2013  . IBS (irritable bowel syndrome) 09/04/2013  . Depression 09/04/2013  .  Dysthymic disorder 01/29/2013  . PTSD (post-traumatic stress disorder) 01/29/2013  . Chronic back pain greater than 3 months duration 01/29/2013  . CNS disorder 01/29/2013  . Insomnia secondary to depression with anxiety 01/29/2013  . Insomnia secondary to chronic pain 01/29/2013  . Constipation 05/10/2012  . Weight loss, abnormal 05/10/2012  . Abdominal pain 05/10/2012  . Vomiting 05/10/2012  . GERD (gastroesophageal reflux disease) 05/10/2012    Darrel Hoover PT 07/29/2015, 1:21 PM  [facility] [address] [place], [place], [address] Phone: [phone]   Fax:  [phone]

## 2015-08-06 ENCOUNTER — Encounter: Payer: Self-pay | Admitting: Gastroenterology

## 2015-08-06 ENCOUNTER — Ambulatory Visit (INDEPENDENT_AMBULATORY_CARE_PROVIDER_SITE_OTHER): Payer: Managed Care, Other (non HMO) | Admitting: Gastroenterology

## 2015-08-06 VITALS — BP 145/101 | HR 75 | Temp 98.3°F | Ht 63.0 in | Wt 211.4 lb

## 2015-08-06 DIAGNOSIS — S29011A Strain of muscle and tendon of front wall of thorax, initial encounter: Secondary | ICD-10-CM | POA: Insufficient documentation

## 2015-08-06 DIAGNOSIS — S29011D Strain of muscle and tendon of front wall of thorax, subsequent encounter: Secondary | ICD-10-CM

## 2015-08-06 DIAGNOSIS — K5901 Slow transit constipation: Secondary | ICD-10-CM | POA: Diagnosis not present

## 2015-08-06 DIAGNOSIS — K219 Gastro-esophageal reflux disease without esophagitis: Secondary | ICD-10-CM

## 2015-08-06 NOTE — Progress Notes (Signed)
Subjective:    Patient ID: Kathryn Olson, female    DOB: [birth year], 54 y.o.   MRN: [id number]  [place], DO  HPI Pain in left flank-SAME AS IT WAS AT LAST VISIT. BMs: LIKE NUTS EVERY TIME SHE EATS. EATING FIBER. DRINKING WATER. SOB IF SHE WALKS LONG DISTANCE. NO COUGH. ACID REFLUX: PHLEGM COMING & BURNS WHEN IT COMES UP. BEEN ON WEIGHT LOSS PLAN. USU. WALKS WITH A WALKER OR LEANS ON HER HUSBAND.  PT DENIES FEVER, CHILLS, HEMATOCHEZIA, HEMATEMESIS, nausea, vomiting, melena, diarrhea, CHEST PAIN, CHANGE IN BOWEL IN HABITS, abdominal pain, problems swallowing, OR  problems with sedation.  Past Medical History  Diagnosis Date  . Environmental allergies   . Anxiety   . Family history of anesthesia complication     MH during C Section  . 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 Complication of anesthesia   . H/O colonoscopy   . Migraine   . Crohn's disease June 2013  . IBS (irritable bowel syndrome) June 2014  . Obsessive-compulsive disorder   . PTSD (post-traumatic stress disorder)   . Seizures   . Chronic abdominal pain   . Chronic headaches   . History of electroencephalogram 03/2014    normal   Past Surgical History  Procedure Laterality Date  . Partial hysterectomy    . Tubal ligation    . Cesarean section      X2  . Bladder surgery      age 29  . Esophagogastroduodenoscopy      remote, had ulcers, [place], Dr. Truddie Coco  . Colonoscopy  05/22/2012    SLF: Polyps, multiple hyperplastic in the sigmoid colon/Polyp in the rectum/  MODERATE Diverticulosis throughout the colon/ Internal hemorrhoids  . Esophagogastroduodenoscopy  05/22/2012    SLF: Stricture in the distal esophagus/Moderate gastritis   Allergies  Allergen Reactions  . Aspirin Anaphylaxis  . Peanut-Containing Drug Products Anaphylaxis       . Penicillins Anaphylaxis    Stops her heart  . Sulfa Antibiotics Anaphylaxis  . Codeine Itching  . Adhesive [Tape] Rash    . Latex Rash  . Rubbing Alcohol [Alcohol] Rash    Current Outpatient Prescriptions  Medication Sig Dispense Refill  . albuterol (PROVENTIL HFA;VENTOLIN HFA) 108 (90 BASE) MCG/ACT inhaler Inhale 2 puffs into the lungs every 6 (six) hours as needed for wheezing or shortness of breath (wheezing).     . ALPRAZolam (XANAX) 0.5 MG tablet Take 0.5 mg by mouth 3 (three) times daily as needed for anxiety (anxiety).     . busPIRone (BUSPAR) 15 MG tablet Take 1 tablet (15 mg total) by mouth 4 (four) times daily.    . Carbamazepine (EQUETRO) 100 MG CP12 12 hr capsule Take 100 mg by mouth 2 (two) times daily.    Marland Kitchen dexlansoprazole (DEXILANT) 60 MG capsule Take 60 mg by mouth daily.    . diphenhydrAMINE (BENADRYL) 25 MG tablet Take 25 mg by mouth every 6 (six) hours as needed for itching (itching).    Marland Kitchen divalproex (DEPAKOTE) 500 MG DR tablet Take 500 mg by mouth daily.    . DULoxetine (CYMBALTA) 60 MG capsule Take 60 mg by mouth daily.    Marland Kitchen EPINEPHrine (EPI-PEN) 0.3 mg/0.3 mL DEVI Inject 0.3 mg into the muscle as needed (for allergic reaction).     .      . famotidine (PEPCID) 20 MG tablet Take 1 tablet (20 mg total) by mouth daily.    Marland Kitchen  gabapentin (NEURONTIN) 300 MG capsule Take 300 mg by mouth 3 (three) times daily.    Marland Kitchen HYDROcodone-acetaminophen (NORCO) 10-325 MG per tablet Take 1 tablet by mouth every 6 (six) hours as needed.    . hydrOXYzine (ATARAX/VISTARIL) 25 MG tablet Take 1 tablet (25 mg total) by mouth every 6 (six) hours as needed for itching.    . levocetirizine (XYZAL) 5 MG tablet Take 5 mg by mouth every evening.    . Linaclotide (LINZESS) 290 MCG CAPS capsule Take 290 mcg by mouth daily.    . Olopatadine HCl 0.2 % SOLN Apply 1-2 drops to eye 2 (two) times daily.    Marland Kitchen omalizumab (XOLAIR) 150 MG injection Inject into the skin every 14 (fourteen) days.    . ondansetron (ZOFRAN) 4 MG tablet Take 1 tablet (4 mg total) by mouth every 6 (six) hours as needed for nausea.    .      .      .  QUEtiapine (SEROQUEL) 400 MG tablet Take 400 mg by mouth at bedtime.    . topiramate (TOPAMAX) 100 MG tablet Take 100 mg by mouth 2 (two) times daily.    Marland Kitchen topiramate (TOPAMAX) 25 MG tablet Take 1 tablet (25 mg total) by mouth 2 (two) times daily.    . traMADol (ULTRAM) 50 MG tablet Take 50 mg by mouth 4 (four) times daily.     Marland Kitchen UNABLE TO FIND CPAP at bedtime     Review of Systems PER HPI OTHERWISE ALL SYSTEMS ARE NEGATIVE.    Objective:   Physical Exam  Constitutional: She is oriented to person, place, and time. She appears well-developed and well-nourished. No distress.  HENT:  Head: Normocephalic and atraumatic.  Mouth/Throat: Oropharynx is clear and moist. No oropharyngeal exudate.  Eyes: Pupils are equal, round, and reactive to light. No scleral icterus.  Neck: Normal range of motion. Neck supple.  Cardiovascular: Normal rate, regular rhythm and normal heart sounds.   Pulmonary/Chest: Effort normal and breath sounds normal. No respiratory distress. She exhibits tenderness (LEFT FLANK AND REPRODUCES COMPLAINT).  Abdominal: Soft. Bowel sounds are normal. She exhibits no distension. There is no tenderness.  Musculoskeletal: She exhibits no edema.  ORTHO BOOT ON LEFT LOWER LEG  Lymphadenopathy:    She has no cervical adenopathy.  Neurological: She is alert and oriented to person, place, and time.  NO NEW FOCAL DEFICITS  Psychiatric:  ANXIOUS MOOD, FLAT AFFECT  Vitals reviewed.     Assessment & Plan:

## 2015-08-06 NOTE — Progress Notes (Signed)
CC'D TO PCP °

## 2015-08-06 NOTE — Assessment & Plan Note (Signed)
SYMPTOMS NOT CONTROLLED AND DUE TO ALTERED BODY MECHANICS WITH BACK PAIN AND ORTHO BOOT IN PLACE.  REVIEWED RADIOLOGY STUDIES FROM 2013 TO PRESENT. ICE TID TO RELIEVE PAIN REASSURED PT IT WILL IMPROVE AFTER SHE GETS THE BOOT OFF HER FOOT FOLLOW UP IN 4 MOS.

## 2015-08-06 NOTE — Progress Notes (Signed)
ON RECALL  °

## 2015-08-06 NOTE — Assessment & Plan Note (Signed)
SYMPTOMS FAIRLY WELL CONTROLLED. WEIGHT STABLE.  LOSE WEIGHT LOW FAT DIET CONTINUE DEXILANT FOLLOW UP IN 4 MOS.

## 2015-08-06 NOTE — Patient Instructions (Signed)
DRINK WATER TO KEEP YOUR URINE LIGHT YELLOW.  CONTINUE YOUR WEIGHT LOSS EFFORTS. LOSE 10 POUNDS.  FOLLOW A HIGH FIBER DIET. SEE INFO BELOW.  ICE PACK THREE TIMES A DAY HELP YOUR LEFT CHEST/FLANK PAIN. DO NOT APPLY ICE PACKS DIRECTLY TO YOUR SKIN.  CONTINUE DEXILANT  AVOID FOOD THAT TRIGGERS REFLUX. SEE INFO BELOW.   FOLLOW UP IN 4 MOS.   High-Fiber Diet A high-fiber diet changes your normal diet to include more whole grains, legumes, fruits, and vegetables. Changes in the diet involve replacing refined carbohydrates with unrefined foods. The calorie level of the diet is essentially unchanged. The Dietary Reference Intake (recommended amount) for adult males is 38 grams per day. For adult females, it is 25 grams per day. Pregnant and lactating women should consume 28 grams of fiber per day. Fiber is the intact part of a plant that is not broken down during digestion. Functional fiber is fiber that has been isolated from the plant to provide a beneficial effect in the body. PURPOSE  Increase stool bulk.   Ease and regulate bowel movements.   Lower cholesterol.  INDICATIONS THAT YOU NEED MORE FIBER  Constipation and hemorrhoids.   Uncomplicated diverticulosis (intestine condition) and irritable bowel syndrome.   Weight management.   As a protective measure against hardening of the arteries (atherosclerosis), diabetes, and cancer.   GUIDELINES FOR INCREASING FIBER IN THE DIET  Start adding fiber to the diet slowly. A gradual increase of about 5 more grams (2 slices of whole-wheat bread, 2 servings of most fruits or vegetables, or 1 bowl of high-fiber cereal) per day is best. Too rapid an increase in fiber may result in constipation, flatulence, and bloating.   Drink enough water and fluids to keep your urine clear or pale yellow. Water, juice, or caffeine-free drinks are recommended. Not drinking enough fluid may cause constipation.   Eat a variety of high-fiber foods rather than  one type of fiber.   Try to increase your intake of fiber through using high-fiber foods rather than fiber pills or supplements that contain small amounts of fiber.   The goal is to change the types of food eaten. Do not supplement your present diet with high-fiber foods, but replace foods in your present diet.  INCLUDE A VARIETY OF FIBER SOURCES  Replace refined and processed grains with whole grains, canned fruits with fresh fruits, and incorporate other fiber sources. White rice, white breads, and most bakery goods contain little or no fiber.   Brown whole-grain rice, buckwheat oats, and many fruits and vegetables are all good sources of fiber. These include: broccoli, Brussels sprouts, cabbage, cauliflower, beets, sweet potatoes, white potatoes (skin on), carrots, tomatoes, eggplant, squash, berries, fresh fruits, and dried fruits.   Cereals appear to be the richest source of fiber. Cereal fiber is found in whole grains and bran. Bran is the fiber-rich outer coat of cereal grain, which is largely removed in refining. In whole-grain cereals, the bran remains. In breakfast cereals, the largest amount of fiber is found in those with "bran" in their names. The fiber content is sometimes indicated on the label.   You may need to include additional fruits and vegetables each day.   In baking, for 1 cup white flour, you may use the following substitutions:   1 cup whole-wheat flour minus 2 tablespoons.   1/2 cup white flour plus 1/2 cup whole-wheat flour.

## 2015-08-06 NOTE — Assessment & Plan Note (Signed)
SYMPTOMS NOT IDEALLY CONTROLLED AND MOST LIKELY DUE TO MEDS.  DRINK WATER EAT FIBER CONTINUE LINZESS FOLLOW UP IN 4 MOS.

## 2015-08-15 ENCOUNTER — Ambulatory Visit
Admission: RE | Admit: 2015-08-15 | Discharge: 2015-08-15 | Disposition: A | Discharge status: Home / Self Care | Payer: Managed Care, Other (non HMO) | Source: Ambulatory Visit

## 2015-08-15 DIAGNOSIS — Z1231 Encounter for screening mammogram for malignant neoplasm of breast: Secondary | ICD-10-CM

## 2015-08-19 ENCOUNTER — Other Ambulatory Visit: Payer: Self-pay | Admitting: Family Medicine

## 2015-08-19 DIAGNOSIS — R928 Other abnormal and inconclusive findings on diagnostic imaging of breast: Secondary | ICD-10-CM

## 2015-08-29 ENCOUNTER — Ambulatory Visit
Admission: RE | Admit: 2015-08-29 | Discharge: 2015-08-29 | Disposition: A | Discharge status: Home / Self Care | Payer: Managed Care, Other (non HMO) | Source: Ambulatory Visit | Attending: Family Medicine | Admitting: Family Medicine

## 2015-08-29 ENCOUNTER — Other Ambulatory Visit: Payer: Self-pay | Admitting: Family Medicine

## 2015-08-29 ENCOUNTER — Other Ambulatory Visit: Payer: Self-pay

## 2015-08-29 ENCOUNTER — Ambulatory Visit: Admission: RE | Admit: 2015-08-29 | Payer: Managed Care, Other (non HMO) | Source: Ambulatory Visit

## 2015-08-29 DIAGNOSIS — R928 Other abnormal and inconclusive findings on diagnostic imaging of breast: Secondary | ICD-10-CM

## 2015-08-29 DIAGNOSIS — N644 Mastodynia: Secondary | ICD-10-CM

## 2015-08-29 DIAGNOSIS — N6452 Nipple discharge: Secondary | ICD-10-CM

## 2015-09-22 ENCOUNTER — Ambulatory Visit
Admission: RE | Admit: 2015-09-22 | Discharge: 2015-09-22 | Disposition: A | Discharge status: Home / Self Care | Payer: Managed Care, Other (non HMO) | Source: Ambulatory Visit | Attending: Family Medicine | Admitting: Family Medicine

## 2015-09-22 DIAGNOSIS — N6452 Nipple discharge: Secondary | ICD-10-CM

## 2015-11-03 ENCOUNTER — Encounter: Payer: Self-pay | Admitting: Gastroenterology

## 2016-01-05 ENCOUNTER — Other Ambulatory Visit: Payer: Self-pay

## 2016-01-06 MED ORDER — ESOMEPRAZOLE MAGNESIUM 40 MG PO CPDR: 40.0000 mg | DELAYED_RELEASE_CAPSULE | Freq: Every day | ORAL | Status: DC

## 2016-01-15 ENCOUNTER — Ambulatory Visit: Payer: Self-pay | Admitting: Gastroenterology

## 2016-01-15 ENCOUNTER — Ambulatory Visit (INDEPENDENT_AMBULATORY_CARE_PROVIDER_SITE_OTHER): Payer: Managed Care, Other (non HMO) | Admitting: Gastroenterology

## 2016-01-15 ENCOUNTER — Encounter: Payer: Self-pay | Admitting: Gastroenterology

## 2016-01-15 VITALS — BP 150/90 | HR 78 | Temp 97.4°F | Ht 62.0 in | Wt 213.2 lb

## 2016-01-15 DIAGNOSIS — K219 Gastro-esophageal reflux disease without esophagitis: Secondary | ICD-10-CM

## 2016-01-15 DIAGNOSIS — K589 Irritable bowel syndrome without diarrhea: Secondary | ICD-10-CM

## 2016-01-15 MED ORDER — DEXLANSOPRAZOLE 60 MG PO CPDR: 60.0000 mg | DELAYED_RELEASE_CAPSULE | Freq: Every day | ORAL | Status: DC

## 2016-01-15 NOTE — Progress Notes (Signed)
CC'ED TO PCP 

## 2016-01-15 NOTE — Assessment & Plan Note (Signed)
SYMPTOMS CONTROLLED/RESOLVED.  CONTINUE TO MONITOR SYMPTOMS. 

## 2016-01-15 NOTE — Patient Instructions (Signed)
CONTINUE YOUR WEIGHT LOSS EFFORTS.   CONTINUE DEXILANT.  FOLLOW UP IN 6 MOS.

## 2016-01-15 NOTE — Assessment & Plan Note (Signed)
SYMPTOMS FAIRLY WELL CONTROLLED.  CONTINUE TO MONITOR SYMPTOMS. 

## 2016-01-15 NOTE — Progress Notes (Signed)
Subjective:    Patient ID: Kathryn Olson, female    DOB: Jul 30, 1961, 55 y.o.   MRN: [id number]  MAHONE[place],MARK, DO   HPI IF GETS DEPRESSED, DRINKs SODA. RARE SWEETS. STOMACH DOING GOOD.  LEFT SIDE PAIN-BEEN COUGHING A LOT WITH SINUSES. WORSE WITHY MOVEMENT. SNEEZING SOME. APPETITE: BEEN GOOD. HAD BEEN 206 LBS. BMS: LOOSE, MOVE BID(#7, #6). IS OK WITH STOOLS. GETS OUT OF BREATH WHEN SHE MOVES AROUND DUE TO CONGESTION AND CARRYING TOO MUCH WEIGHT. EATS CEREAL(LUCKY CHARM, CHEERIOS)BEFORE SHE GOES TO DOWN.  Wants to lose weight because she went from a size 6 to 20 in one mo. PT DENIES FEVER, CHILLS, HEMATOCHEZIA, nausea, vomiting, melena, diarrhea, CHEST PAIN, SHORTNESS OF BREATH,  CHANGE IN BOWEL IN HABITS, constipation, abdominal pain, problems swallowing, OR heartburn or indigestion.   Past Medical History  Diagnosis Date  . Environmental allergies   . Anxiety   . Family history of anesthesia complication     MH during C Section  . 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 Complication of anesthesia   . H/O colonoscopy   . Migraine   . Crohn's disease [facility]) June 2013  . IBS (irritable bowel syndrome) June 2014  . Obsessive-compulsive disorder   . PTSD (post-traumatic stress disorder)   . Seizures ([place])   . Chronic abdominal pain   . Chronic headaches   . History of electroencephalogram 03/2014    normal   Past Surgical History  Procedure Laterality Date  . Partial hysterectomy    . Tubal ligation    . Cesarean section      X2  . Bladder surgery      age 65  . Esophagogastroduodenoscopy      remote, had ulcers, [place], Dr. Truddie Coco  . Colonoscopy  05/22/2012    SLF: Polyps, multiple hyperplastic in the sigmoid colon/Polyp in the rectum/  MODERATE Diverticulosis throughout the colon/ Internal hemorrhoids  . Esophagogastroduodenoscopy  05/22/2012    SLF: Stricture in the distal esophagus/Moderate gastritis   Allergies  Allergen Reactions   . Aspirin Anaphylaxis  . Peanut-Containing Drug Products Anaphylaxis       . Penicillins Anaphylaxis    Stops her heart  . Sulfa Antibiotics Anaphylaxis  . Codeine Itching  . Adhesive [Tape] Rash  . Latex Rash  . Rubbing Alcohol [Alcohol] Rash   Current Outpatient Prescriptions  Medication Sig Dispense Refill  . albuterol (PROVENTIL HFA;VENTOLIN HFA) 108 (90 BASE) MCG/ACT inhaler Inhale 2 puffs into the lungs every 6 (six) hours as needed for wheezing or shortness of breath (wheezing).     . ALPRAZolam (XANAX) 0.5 MG tablet Take 0.5 mg by mouth 3 (three) times daily as needed for anxiety (anxiety).     . busPIRone (BUSPAR) 15 MG tablet Take 1 tablet (15 mg total) by mouth 4 (four) times daily.    . Carbamazepine (EQUETRO) 100 MG CP12 12 hr capsule Take 100 mg by mouth 2 (two) times daily.    Marland Kitchen dexlansoprazole (DEXILANT) 60 MG capsule Take 60 mg by mouth daily.    . diphenhydrAMINE (BENADRYL) 25 MG tablet Take 25 mg by mouth every 6 (six) hours as needed for itching (itching).    Marland Kitchen divalproex (DEPAKOTE) 500 MG DR tablet Take 500 mg by mouth daily.    . DULoxetine (CYMBALTA) 60 MG capsule Take 60 mg by mouth daily.    Marland Kitchen EPINEPHrine (EPI-PEN) 0.3 mg/0.3 mL DEVI Inject 0.3 mg into the  muscle as needed (for allergic reaction).     . famotidine (PEPCID) 20 MG tablet Take 1 tablet (20 mg total) by mouth daily.    Marland Kitchen gabapentin (NEURONTIN) 300 MG capsule Take 300 mg by mouth 3 (three) times daily.    Marland Kitchen HYDROcodone-acetaminophen (NORCO) 10-325 MG per tablet Take 1 tablet by mouth every 6 (six) hours as needed.    . hydrOXYzine (ATARAX/VISTARIL) 25 MG tablet Take 1 tablet (25 mg total) by mouth every 6 (six) hours as needed for itching.    . levocetirizine (XYZAL) 5 MG tablet Take 5 mg by mouth every evening.    . Linaclotide (LINZESS) 290 MCG CAPS capsule Take 290 mcg by mouth daily.    . Olopatadine HCl 0.2 % SOLN Apply 1-2 drops to eye 2 (two) times daily.    Marland Kitchen omalizumab (XOLAIR) 150 MG  injection Inject into the skin every 14 (fourteen) days.    . ondansetron (ZOFRAN) 4 MG tablet Take 1 tablet (4 mg total) by mouth every 6 (six) hours as needed for nausea.    Marland Kitchen oxyCODONE-acetaminophen (PERCOCET/ROXICET) 5-325 MG per tablet Take 1-2 tablets by mouth every 6 (six) hours as needed for severe pain.    . predniSONE (DELTASONE) 10 MG tablet Take 50 mg by mouth for 3 days, then take 40 mg by mouth for 3 days, then take 30 mg by mouth for 3 days, then take 20 mg by mouth for 3 days, then take 10 mg by mouth for 3 days, then take 5 mg by mouth for 3 days    . QUEtiapine (SEROQUEL) 400 MG tablet Take 400 mg by mouth at bedtime.    . topiramate (TOPAMAX) 25 MG tablet Take 1 tablet (25 mg total) by mouth 2 (two) times daily.    . traMADol (ULTRAM) 50 MG tablet Take 50 mg by mouth 4 (four) times daily.     Marland Kitchen UNABLE TO FIND CPAP at bedtime    . topiramate (TOPAMAX) 100 MG tablet Take 100 mg by mouth 2 (two) times daily.     Review of Systems PER HPI OTHERWISE ALL SYSTEMS ARE NEGATIVE.    Objective:   Physical Exam  Constitutional: She is oriented to person, place, and time. She appears well-developed and well-nourished. No distress.  HENT:  Head: Normocephalic and atraumatic.  Mouth/Throat: Oropharynx is clear and moist. No oropharyngeal exudate.  Eyes: Pupils are equal, round, and reactive to light. No scleral icterus.  Neck: Normal range of motion. Neck supple.  Cardiovascular: Normal rate, regular rhythm and normal heart sounds.   Pulmonary/Chest: Effort normal and breath sounds normal. No respiratory distress.  Abdominal: Soft. Bowel sounds are normal. She exhibits no distension. There is no tenderness.  Musculoskeletal: She exhibits no edema.  Lymphadenopathy:    She has no cervical adenopathy.  Neurological: She is alert and oriented to person, place, and time.  NO FOCAL DEFICITS  Psychiatric: She has a normal mood and affect.  Tearful at times  Vitals reviewed.       Assessment & Plan:

## 2016-01-16 NOTE — Progress Notes (Signed)
ON RECALL  °

## 2016-01-26 ENCOUNTER — Telehealth: Payer: Self-pay

## 2016-01-26 NOTE — Telephone Encounter (Signed)
Pharmacist called from [facility] in [place], to say that pt would like something cheaper than Dexiolant. It will cost her around $50.00 and she cannot afford that. Please advise!

## 2016-01-28 MED ORDER — ESOMEPRAZOLE MAGNESIUM 40 MG PO CPDR: DELAYED_RELEASE_CAPSULE | ORAL | Status: DC

## 2016-01-28 NOTE — Telephone Encounter (Signed)
PLEASE CALL PT. RX SENT FOR BID NEXIUM.

## 2016-01-29 NOTE — Telephone Encounter (Signed)
Pt is aware.  

## 2016-05-18 ENCOUNTER — Encounter: Payer: Self-pay | Admitting: Gastroenterology

## 2016-07-12 DIAGNOSIS — F419 Anxiety disorder, unspecified: Secondary | ICD-10-CM | POA: Insufficient documentation

## 2016-07-12 DIAGNOSIS — K219 Gastro-esophageal reflux disease without esophagitis: Secondary | ICD-10-CM | POA: Insufficient documentation

## 2016-07-29 ENCOUNTER — Ambulatory Visit: Payer: Self-pay | Admitting: Gastroenterology

## 2016-08-04 ENCOUNTER — Encounter: Payer: Self-pay | Admitting: Gastroenterology

## 2016-08-04 ENCOUNTER — Ambulatory Visit (INDEPENDENT_AMBULATORY_CARE_PROVIDER_SITE_OTHER): Payer: Managed Care, Other (non HMO) | Admitting: Gastroenterology

## 2016-08-04 ENCOUNTER — Other Ambulatory Visit: Payer: Self-pay

## 2016-08-04 DIAGNOSIS — K219 Gastro-esophageal reflux disease without esophagitis: Secondary | ICD-10-CM | POA: Diagnosis not present

## 2016-08-04 DIAGNOSIS — K582 Mixed irritable bowel syndrome: Secondary | ICD-10-CM | POA: Diagnosis not present

## 2016-08-04 DIAGNOSIS — M79601 Pain in right arm: Secondary | ICD-10-CM

## 2016-08-04 DIAGNOSIS — K5901 Slow transit constipation: Secondary | ICD-10-CM

## 2016-08-04 NOTE — Assessment & Plan Note (Signed)
HAD CONSTIPATION NOW DIARRHEA.   SUBMIT STOOL STUDIES FOR C DIFF.

## 2016-08-04 NOTE — Progress Notes (Signed)
ON RECALL  °

## 2016-08-04 NOTE — Progress Notes (Signed)
Subjective:    Patient ID: Kathryn Olson, female    DOB: Dec 21, 1960, 55 y.o.   MRN: [id number]   Emelda Fear, DO  HPI Was hospitalized in Aug and had respiratory failure. [place] STAY SEP 4 BEEN C/O PAIN IN RIGHT ARM. RANGE OF MOTION LIMITED BY PAIN. BMs: GETTING BACK ON TRACT BUT LOOSE/DIARRHEA-NONE SINCE PAST WEEK. NEXIUM BID. CARAFATE-BID. PEPCID AS NEEDED. NAUSEA: MEATS. TAKING LINZESS EVERY DAY. [place]. STEROIDS/ABX: YES-SEP 2017.   PT DENIES FEVER, CHILLS, HEMATOCHEZIA, HEMATEMESIS, vomiting, melena, diarrhea, CHEST PAIN, SHORTNESS OF BREATH,  CHANGE IN BOWEL IN HABITS, constipation, abdominal pain, problems swallowing, problems with sedation, heartburn or indigestion.   Past Medical History:  Diagnosis Date  . Anxiety   . Chronic abdominal pain   . Chronic headaches   . Complication of anesthesia   . Crohn's disease [facility]) June 2013  . Environmental allergies   . Family history of anesthesia complication    MH during C Section  . H/O colonoscopy   . History of electroencephalogram 03/2014   normal  . IBS (irritable bowel syndrome) June 2014  . 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 Migraine   . Obsessive-compulsive disorder   . PTSD (post-traumatic stress disorder)   . Seizures ([place])     Past Surgical History:  Procedure Laterality Date  . BLADDER SURGERY     age 9  . CESAREAN SECTION     X2  . COLONOSCOPY  05/22/2012   SLF: Polyps, multiple hyperplastic in the sigmoid colon/Polyp in the rectum/  MODERATE Diverticulosis throughout the colon/ Internal hemorrhoids  . ESOPHAGOGASTRODUODENOSCOPY     remote, had ulcers, [place], Dr. Truddie Coco  . ESOPHAGOGASTRODUODENOSCOPY  05/22/2012   SLF: Stricture in the distal esophagus/Moderate gastritis  . PARTIAL HYSTERECTOMY    . TUBAL LIGATION      Allergies  Allergen Reactions  . Aspirin Anaphylaxis  . Peanut-Containing Drug Products Anaphylaxis      . Penicillins Anaphylaxis    Stops her heart  . Sulfa Antibiotics Anaphylaxis  . Codeine Itching  . Adhesive [Tape] Rash  . Latex Rash  . Rubbing Alcohol [Alcohol] Rash    Current Outpatient Prescriptions  Medication Sig Dispense Refill  . albuterol (PROVENTIL HFA;VENTOLIN HFA) 108 (90 BASE) MCG/ACT inhaler Inhale 2 puffs into the lungs every 6 (six) hours as needed for wheezing or shortness of breath (wheezing).     . ALPRAZolam (XANAX) 0.5 MG tablet Take 0.5 mg by mouth 3 (three) times daily as needed for anxiety (anxiety).     . busPIRone (BUSPAR) 15 MG tablet Take 1 tablet (15 mg total) by mouth 4 (four) times daily.    . Carbamazepine  CP12 12 hr capsule Take 100 mg by mouth 2 (two) times daily.    Marland Kitchen BENADRYL 25 MG tablet Take 25 mg by mouth every 6 (six) hours as needed for itching (itching).     DEPAKOTE Take 500 mg by mouth daily.    . CYMBALTA 60 MG capsule Take 60 mg by mouth daily.    . EPI-PEN 0.3 mg/0.3 mL DEVI Inject 0.3 mg into the muscle as needed (for allergic reaction).      NEXIUM 1 PO 30 MINUTES PRIOR TO MEALS BID    . famotidine (PEPCID) 20 MG tablet Take 1 tablet (20 mg total) by mouth daily.    . furosemide (LASIX) 40 MG tablet BID    . gabapentin  300 MG capsule Take 300 mg by mouth 3 (three) times daily.    . hydrOXYzine 25 MG tablet Take 1 tablet (25 mg total) by mouth every 6 (six) hours as needed for itching.    . levocetirizine (XYZAL) 5 MG tablet Take 5 mg by mouth every evening.    . Linaclotide 290 MCG CAPS capsule Take 290 mcg by mouth daily.    Marland Kitchen LOPRESSOR) 25 MG tablet BID    . Olopatadine HCl 0.2 % SOLN Apply 1-2 drops to eye 2 (two) times daily.    Marland Kitchen omalizumab (XOLAIR) 150 MG injection Inject into the skin every 14 (fourteen) days.    . ondansetron (ZOFRAN) 4 MG tablet Take 1 tablet (4 mg total) by mouth every 6 (six) hours as needed for nausea.    . SEROQUEL 300 MG tablet 300 mg. 1/2 tab in am and pm    . SEROQUEL 400 MG tablet Take 400 mg by  mouth at bedtime.    Marland Kitchen tOPAMAX 100 MG tablet Take 100 mg by mouth 2 (two) times daily.    . traMADol (ULTRAM) 50 MG tablet Take 50 mg by mouth 4 (four) times daily.     Marland Kitchen UNABLE TO FIND CPAP at bedtime    . CARAFATE 1 GM/10ML suspension     . NORCO 10-325 MG per tablet Take 1 tablet by mouth every 6 (six) hours as needed.    .      .      .       Review of Systems PER HPI OTHERWISE ALL SYSTEMS ARE NEGATIVE.    Objective:   Physical Exam  Constitutional: She is oriented to person, place, and time. She appears well-developed and well-nourished. No distress.  HENT:  Head: Normocephalic and atraumatic.  Mouth/Throat: Oropharynx is clear and moist. No oropharyngeal exudate.  Eyes: Pupils are equal, round, and reactive to light. No scleral icterus.  Neck: Normal range of motion. Neck supple.  Cardiovascular: Normal rate, regular rhythm and normal heart sounds.   Pulmonary/Chest: Effort normal and breath sounds normal. No respiratory distress.  Abdominal: Soft. Bowel sounds are normal. She exhibits no distension. There is no tenderness.  Musculoskeletal: She exhibits no edema.  LIMITED RANGE OF MOTION OF RUE, UNABLE TO EXTEND ELBOW OR OR ABDUCT HER SHOULDER. MODERATE TENDERNESS TO PALPATION OVER R TRAPEZIUS AND STERNOCLEIDOMASTOID MUSCLES.  Lymphadenopathy:    She has no cervical adenopathy.  Neurological: She is alert and oriented to person, place, and time.  Psychiatric: She has a normal mood and affect.  Vitals reviewed.     Assessment & Plan:

## 2016-08-04 NOTE — Progress Notes (Signed)
cc'ed to pcp °

## 2016-08-04 NOTE — Assessment & Plan Note (Signed)
ASSOCIATED WITH LIMITED RANGE OF MOTION.  REFER TO ORTHO. ICE TO SHOULDER TID.

## 2016-08-04 NOTE — Assessment & Plan Note (Signed)
SYMPTOMS CONTROLLED/RESOLVED NOW WITH WATERY STOOLS AFTER STEROIDS AND ABX.   HOLD LINZESS FOR WATERY STOOLS. SUBMIT STOOL SAMPLE FOR C DIFF. FOLLOW UP IN 6 MOS.  Kathryn Olson

## 2016-08-04 NOTE — Patient Instructions (Addendum)
SEE ORTHO FOR AN EVALUATION FOR RIGHT ARM.  CONTINUE YOUR WEIGHT LOSS EFFORTS.  CONTINUE NEXIUM. TAKE 30 MINUTES BEFORE MEALS TWICE DAILY.  PEPCID HELPS MOST WHEN USED AS NEEDED.  STOP CARAFATE.  HOLD LINZESS IF YOU ARE HAVING WATERY STOOLS.  SUBMIT STOOL SAMPLE FOR C DIFF IF YOU HAVE WATERY STOOLS AFTER HOLDING LINZESS AND CARAFATE.  FOLLOW UP IN 6 MOS.

## 2016-08-04 NOTE — Assessment & Plan Note (Signed)
SYMPTOMS FAIRLY WELL CONTROLLED.   CONTINUE YOUR WEIGHT LOSS EFFORTS.  CONTINUE NEXIUM. TAKE 30 MINUTES BEFORE MEALS TWICE DAILY.  PEPCID HELPS MOST WHEN USED AS NEEDED.  FOLLOW UP IN 6 MOS.

## 2016-08-05 ENCOUNTER — Other Ambulatory Visit: Payer: Self-pay

## 2016-08-05 DIAGNOSIS — M79601 Pain in right arm: Secondary | ICD-10-CM

## 2016-08-10 ENCOUNTER — Ambulatory Visit (INDEPENDENT_AMBULATORY_CARE_PROVIDER_SITE_OTHER): Payer: Managed Care, Other (non HMO)

## 2016-08-10 ENCOUNTER — Ambulatory Visit (INDEPENDENT_AMBULATORY_CARE_PROVIDER_SITE_OTHER): Payer: Managed Care, Other (non HMO) | Admitting: Orthopaedic Surgery

## 2016-08-10 ENCOUNTER — Encounter: Payer: Self-pay | Admitting: Orthopaedic Surgery

## 2016-08-10 VITALS — BP 146/102 | HR 60 | Ht 62.0 in | Wt 209.0 lb

## 2016-08-10 DIAGNOSIS — M25511 Pain in right shoulder: Secondary | ICD-10-CM

## 2016-08-10 NOTE — Progress Notes (Signed)
Subjective:  My right shoulder hurts    Patient ID: Kathryn Olson, female    DOB: 1961-09-09, 55 y.o.   MRN: [id number]  HPI She was hospitalized in [place] the end for August for about nine days for congestive heart failure.  She started noticing then her right shoulder was painful.  She had no  Trauma.  She has no redness.  Her right shoulder has gotten more painful and she is using it less and less.  She has had some swelling of the right neck area and it hurts to raise her arm over her head.  She has no numbness but has had slight numbness at times of the entire hand.    Her heart is doing well now.  She is on medication for this.   Review of Systems  HENT: Negative for congestion.   Respiratory: Positive for chest tightness and shortness of breath. Negative for cough.   Cardiovascular: Positive for palpitations. Negative for chest pain and leg swelling.  Endocrine: Positive for cold intolerance.  Musculoskeletal: Positive for arthralgias, joint swelling and neck pain.  Allergic/Immunologic: Positive for environmental allergies.  Psychiatric/Behavioral: Agitation: .wkov. The patient is nervous/anxious.    Past Medical History:  Diagnosis Date  . Anxiety   . CHF (congestive heart failure) ([place])   . Chronic abdominal pain   . Chronic headaches   . Complication of anesthesia   . Crohn's disease [facility]) June 2013  . Environmental allergies   . Family history of anesthesia complication    MH during C Section  . H/O colonoscopy   . History of electroencephalogram 03/2014   normal  . IBS (irritable bowel syndrome) June 2014  . 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 Migraine   . Obsessive-compulsive disorder   . PTSD (post-traumatic stress disorder)   . Seizures ([place])     Past Surgical History:  Procedure Laterality Date  . BLADDER SURGERY     age 77  . CESAREAN SECTION     X2  . COLONOSCOPY  05/22/2012   SLF: Polyps, multiple  hyperplastic in the sigmoid colon/Polyp in the rectum/  MODERATE Diverticulosis throughout the colon/ Internal hemorrhoids  . ESOPHAGOGASTRODUODENOSCOPY     remote, had ulcers, [place], Dr. Truddie Coco  . ESOPHAGOGASTRODUODENOSCOPY  05/22/2012   SLF: Stricture in the distal esophagus/Moderate gastritis  . PARTIAL HYSTERECTOMY    . TUBAL LIGATION      Current Outpatient Prescriptions on File Prior to Visit  Medication Sig Dispense Refill  . albuterol (PROVENTIL HFA;VENTOLIN HFA) 108 (90 BASE) MCG/ACT inhaler Inhale 2 puffs into the lungs every 6 (six) hours as needed for wheezing or shortness of breath (wheezing).     . ALPRAZolam (XANAX) 0.5 MG tablet Take 0.5 mg by mouth 3 (three) times daily as needed for anxiety (anxiety).     . busPIRone (BUSPAR) 15 MG tablet Take 1 tablet (15 mg total) by mouth 4 (four) times daily. 120 tablet 1  . CARAFATE 1 GM/10ML suspension     . Carbamazepine (EQUETRO) 100 MG CP12 12 hr capsule Take 100 mg by mouth 2 (two) times daily.    . diphenhydrAMINE (BENADRYL) 25 MG tablet Take 25 mg by mouth every 6 (six) hours as needed for itching (itching).    Marland Kitchen divalproex (DEPAKOTE) 500 MG DR tablet Take 500 mg by mouth daily.    . DULoxetine (CYMBALTA) 60 MG capsule Take 60 mg by mouth daily.    Marland Kitchen  EPINEPHrine (EPI-PEN) 0.3 mg/0.3 mL DEVI Inject 0.3 mg into the muscle as needed (for allergic reaction).     Marland Kitchen esomeprazole (NEXIUM) 40 MG capsule 1 PO 30 MINUTES PRIOR TO MEALS BID 60 capsule 11  . famotidine (PEPCID) 20 MG tablet Take 1 tablet (20 mg total) by mouth daily. 30 tablet 0  . furosemide (LASIX) 40 MG tablet     . gabapentin (NEURONTIN) 300 MG capsule Take 300 mg by mouth 3 (three) times daily.    Marland Kitchen HYDROcodone-acetaminophen (NORCO) 10-325 MG per tablet Take 1 tablet by mouth every 6 (six) hours as needed.    . hydrOXYzine (ATARAX/VISTARIL) 25 MG tablet Take 1 tablet (25 mg total) by mouth every 6 (six) hours as needed for itching. 12 tablet 0  . levocetirizine  (XYZAL) 5 MG tablet Take 5 mg by mouth every evening.    . Linaclotide (LINZESS) 290 MCG CAPS capsule Take 290 mcg by mouth daily.    . metoprolol tartrate (LOPRESSOR) 25 MG tablet     . Olopatadine HCl 0.2 % SOLN Apply 1-2 drops to eye 2 (two) times daily.    Marland Kitchen omalizumab (XOLAIR) 150 MG injection Inject into the skin every 14 (fourteen) days.    . ondansetron (ZOFRAN) 4 MG tablet Take 1 tablet (4 mg total) by mouth every 6 (six) hours as needed for nausea. 30 tablet 1  . oxyCODONE-acetaminophen (PERCOCET/ROXICET) 5-325 MG per tablet Take 1-2 tablets by mouth every 6 (six) hours as needed for severe pain. 8 tablet 0  . predniSONE (DELTASONE) 10 MG tablet Take 50 mg by mouth for 3 days, then take 40 mg by mouth for 3 days, then take 30 mg by mouth for 3 days, then take 20 mg by mouth for 3 days, then take 10 mg by mouth for 3 days, then take 5 mg by mouth for 3 days 47 tablet 0  . QUEtiapine (SEROQUEL) 300 MG tablet 300 mg. 1/2 tab in am and pm    . QUEtiapine (SEROQUEL) 400 MG tablet Take 400 mg by mouth at bedtime.    . topiramate (TOPAMAX) 100 MG tablet Take 100 mg by mouth 2 (two) times daily.    Marland Kitchen topiramate (TOPAMAX) 25 MG tablet Take 1 tablet (25 mg total) by mouth 2 (two) times daily. 60 tablet 3  . traMADol (ULTRAM) 50 MG tablet Take 50 mg by mouth 4 (four) times daily.     Marland Kitchen UNABLE TO FIND CPAP at bedtime     No current facility-administered medications on file prior to visit.     Social History   Social History  . Marital status: Married    Spouse name: N/A  . Number of children: 2  . Years of education: N/A   Occupational History  . plant, manual labor, lifting   . Disabled     Social History Main Topics  . Smoking status: Former Smoker    Quit date: 01/01/1989  . Smokeless tobacco: Never Used     Comment: Quit a few years ago  . Alcohol use No     Comment: socially  . Drug use: No  . Sexual activity: Yes    Birth control/ protection: Surgical   Other Topics  Concern  . Not on file   Social History Narrative   Patient is married with 2 children.   Patient is disabled.   Patient has 12+ years or schooling    Patient states that she can write with both hands.  Family History  Problem Relation Age of Onset  . Colon cancer Maternal Grandfather     ?less than age 40s  . Heart attack Father   . Alcohol abuse Father   . Diabetes Mother   . Hypertension Mother   . Dementia Mother   . Depression Mother   . Liver disease Brother     does know details, but related to MVA?  Marland Kitchen Anxiety disorder Sister   . Bipolar disorder Sister   . OCD Sister   . Anxiety disorder Sister   . Depression Sister   . Malignant hyperthermia Daughter   . Heart murmur Son   . Inflammatory bowel disease Neg Hx   . ADD / ADHD Neg Hx   . Drug abuse Neg Hx   . Paranoid behavior Neg Hx   . Schizophrenia Neg Hx   . Seizures Neg Hx   . Sexual abuse Neg Hx   . Physical abuse Neg Hx   . Colon polyps Neg Hx     BP (!) 146/102   Pulse 60   Ht 5' 2"  (1.575 m)   Wt 209 lb (94.8 kg)   BMI 38.23 kg/m      Objective:   Physical Exam  Constitutional: She is oriented to person, place, and time. She appears well-developed and well-nourished.  HENT:  Head: Normocephalic and atraumatic.  Eyes: Conjunctivae and EOM are normal. Pupils are equal, round, and reactive to light.  Neck: Normal range of motion. Neck supple.  Cardiovascular: Normal rate, regular rhythm and intact distal pulses.   Pulmonary/Chest: Effort normal.  Abdominal: Soft.  Musculoskeletal: She exhibits tenderness (Pain right shoulder, ROM forward 90, abduction 75, internal 20, external 40, extension 10, adduction full; NV intact, no redness, no swelling, ROM neck full, left shoulder negative.).  Neurological: She is alert and oriented to person, place, and time. She displays normal reflexes. No cranial nerve deficit. She exhibits normal muscle tone. Coordination normal.  Skin: Skin is warm and  dry.  Psychiatric: She has a normal mood and affect. Her behavior is normal. Judgment and thought content normal.    X-rays were done of the right shoulder, reported separately.      Assessment & Plan:   Encounter Diagnosis  Name Primary?  . Pain in joint of right shoulder Yes   PROCEDURE NOTE:  The patient request injection, verbal consent was obtained.  The right shoulder was prepped appropriately after time out was performed.   Sterile technique was observed and injection of 1 cc of Depo-Medrol 40 mg with several cc's of plain xylocaine. Anesthesia was provided by ethyl chloride and a 20-gauge needle was used to inject the shoulder area. A posterior approach was used.  The injection was tolerated well.  A band aid dressing was applied.  The patient was advised to apply ice later today and tomorrow to the injection sight as needed.  I have set up OT for her right shoulder.  Return in two weeks.  Call if any problem.  Electronically Signed Sanjuana Kava, MD 10/24/20172:25 PM

## 2016-08-17 ENCOUNTER — Ambulatory Visit: Payer: Managed Care, Other (non HMO) | Attending: Orthopaedic Surgery | Admitting: Physical Therapy

## 2016-08-17 DIAGNOSIS — M25511 Pain in right shoulder: Secondary | ICD-10-CM

## 2016-08-17 DIAGNOSIS — M542 Cervicalgia: Secondary | ICD-10-CM

## 2016-08-17 NOTE — Therapy (Signed)
[facility] [place], [place], [address] Phone: [phone]   Fax:  [phone]  Physical Therapy Evaluation  Patient Details  Name: Kathryn Olson MRN: [id number] Date of Birth: [birth year] Referring Provider: Sanjuana Kava MD  Encounter Date: 08/17/2016      PT End of Session - 08/17/16 1539    Visit Number 1   Number of Visits 12   Date for PT Re-Evaluation 09/28/16   PT Start Time 1030   PT Stop Time 1103   PT Time Calculation (min) 33 min      Past Medical History:  Diagnosis Date  . Anxiety   . CHF (congestive heart failure) ([place])   . Chronic abdominal pain   . Chronic headaches   . Complication of anesthesia   . Crohn's disease [facility]) June 2013  . Environmental allergies   . Family history of anesthesia complication    MH during C Section  . H/O colonoscopy   . History of electroencephalogram 03/2014   normal  . IBS (irritable bowel syndrome) June 2014  . 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 Migraine   . Obsessive-compulsive disorder   . PTSD (post-traumatic stress disorder)   . Seizures ([place])     Past Surgical History:  Procedure Laterality Date  . BLADDER SURGERY     age 55  . CESAREAN SECTION     X2  . COLONOSCOPY  05/22/2012   SLF: Polyps, multiple hyperplastic in the sigmoid colon/Polyp in the rectum/  MODERATE Diverticulosis throughout the colon/ Internal hemorrhoids  . ESOPHAGOGASTRODUODENOSCOPY     remote, had ulcers, [place], Dr. Truddie Coco  . ESOPHAGOGASTRODUODENOSCOPY  05/22/2012   SLF: Stricture in the distal esophagus/Moderate gastritis  . PARTIAL HYSTERECTOMY    . TUBAL LIGATION      There were no vitals filed for this visit.       Subjective Assessment - 08/17/16 1450    Subjective The patient states that after eating at a restaurant on August 26th, 2017 she began to feel very poorly.  As it turns out she states she was having a cardiac  event and respiratory arrest.  The patient states that she spent 10 days in the hospital and remembers very little.  When she woke up she states she was feeling righ sided neck pain and pain into her right shoulder and down the length of her arm.  She also states that her swallowing and speech have been affected.  She has seen a Neurologist and does have a follow-up.  She reports she has limited use of her right UE and can raise it only to shoulder height.     Patient Stated Goals Use right shoulder again.   Currently in Pain? Yes   Pain Score 8    Pain Location Shoulder   Pain Orientation Right   Pain Descriptors / Indicators Aching;Shooting;Sharp   Pain Onset More than a month ago   Pain Frequency Constant   Aggravating Factors  "Everything."   Pain Relieving Factors "Nothing."            [facility] PT Assessment - 08/17/16 0001      Assessment   Medical Diagnosis Pain in joint of right shoulder.   Referring Provider Sanjuana Kava MD   Onset Date/Surgical Date --  June 12, 2016.     Precautions   Precaution Comments Multiple allergies (ie:  Rubbing Alcohol).     Restrictions  Weight Bearing Restrictions No     Balance Screen   Has the patient fallen in the past 6 months No   Has the patient had a decrease in activity level because of a fear of falling?  No   Is the patient reluctant to leave their home because of a fear of falling?  No     Home Environment   Living Environment Private residence     Prior Function   Level of Independence Independent     Posture/Postural Control   Posture/Postural Control Postural limitations   Postural Limitations Rounded Shoulders;Forward head     ROM / Strength   AROM / PROM / Strength AROM;PROM;Strength     AROM   Overall AROM Comments Patient only able to elevate her right shoulder to 80 degrees and no active internal or external rotation.     PROM   Overall PROM Comments Full passive right shoulder ROM.     Strength    Overall Strength Comments Right grip= 0# and left= 7#.  Left shoulder strength not graded grossly over a 2/5.     Palpation   Palpation comment Tender to palpation over right SCM and c/o diffuse right shoulder pain but no specific areas of palpable tenderness.     Special Tests    Special Tests --  Normal UE DTR's.     Ambulation/Gait   Gait Comments WNL with a lumbar corset.                                PT Long Term Goals - 08/17/16 1629      PT LONG TERM GOAL #1   Title Independent with a HEP.   Time 6   Period Weeks   Status New     PT LONG TERM GOAL #2   Title Full active ROM of right shoulder.   Time 6   Period Weeks   Status New     PT LONG TERM GOAL #3   Title Active ER to 70 degrees+ to allow for easily donning/doffing of apparel.   Time 6   Period Weeks   Status New     PT LONG TERM GOAL #4   Title Perform ADL's with pain not > 3/10.   Time 6   Period Weeks   Status New               Plan - 08/17/16 1541    Clinical Impression Statement The patient presents with an evolving right UE syndrome.  She is extremely weak and has very limited active range of motion.  She registered no grip on the hand dynamometer and reports pain from the right side of her neck down the length of her right UE.   Rehab Potential Poor   PT Frequency 2x / week   PT Duration 6 weeks   PT Treatment/Interventions ADLs/Self Care Home Management;Cryotherapy;Electrical Stimulation;Moist Heat;Ultrasound;Patient/family education;Neuromuscular re-education;Therapeutic exercise;Therapeutic activities;Manual techniques;Passive range of motion   PT Next Visit Plan AAROM right UE; modalities PRN.  No rubbing alcohol    Consulted and Agree with Plan of Care Patient      Patient will benefit from skilled therapeutic intervention in order to improve the following deficits and impairments:  Pain, Decreased activity tolerance, Decreased range of motion, Decreased  strength  Visit Diagnosis: Acute pain of right shoulder - Plan: PT plan of care cert/re-cert  Cervicalgia - Plan: PT plan of care cert/re-cert  Problem List Patient Active Problem List   Diagnosis Date Noted  . Pain in right arm 08/04/2016  . Chest wall muscle strain 08/06/2015  . Crohn's disease ([place]) 09/04/2013  . IBS (irritable bowel syndrome) 09/04/2013  . Depression 09/04/2013  . Dysthymic disorder 01/29/2013  . PTSD (post-traumatic stress disorder) 01/29/2013  . Chronic back pain greater than 3 months duration 01/29/2013  . CNS disorder 01/29/2013  . Insomnia secondary to depression with anxiety 01/29/2013  . Insomnia secondary to chronic pain 01/29/2013  . Constipation 05/10/2012  . Weight loss, abnormal 05/10/2012  . Abdominal pain 05/10/2012  . Vomiting 05/10/2012  . GERD (gastroesophageal reflux disease) 05/10/2012    Brix Brearley, [place] MPT 08/17/2016, 5:23 PM  [facility] [place], [place], [address] Phone: [phone]   Fax:  [phone]  Name: Greenley Martone MRN: [id number] Date of Birth: [birth year]

## 2016-08-24 ENCOUNTER — Encounter: Payer: Self-pay | Admitting: Orthopaedic Surgery

## 2016-08-24 ENCOUNTER — Ambulatory Visit (INDEPENDENT_AMBULATORY_CARE_PROVIDER_SITE_OTHER): Payer: Managed Care, Other (non HMO) | Admitting: Orthopaedic Surgery

## 2016-08-24 ENCOUNTER — Ambulatory Visit: Payer: Managed Care, Other (non HMO) | Attending: Orthopaedic Surgery | Admitting: *Deleted

## 2016-08-24 VITALS — BP 138/87 | HR 70 | Temp 97.7°F | Ht 62.0 in | Wt 212.0 lb

## 2016-08-24 DIAGNOSIS — M25511 Pain in right shoulder: Secondary | ICD-10-CM | POA: Diagnosis not present

## 2016-08-24 DIAGNOSIS — M542 Cervicalgia: Secondary | ICD-10-CM

## 2016-08-24 MED ORDER — TRAMADOL HCL 50 MG PO TABS: 50.0000 mg | ORAL_TABLET | Freq: Four times a day (QID) | ORAL | 3 refills | Status: DC | PRN

## 2016-08-24 NOTE — Progress Notes (Signed)
Patient Kathryn Olson, female DOB:06-08-1961, 55 y.o. VVO:[id number]  Chief Complaint  Patient presents with  . Follow-up    Right arm    HPI  Kathryn Olson is a 55 y.o. female who has pain of the right shoulder.  She is slightly better. She says the PT/OT did not help that much and she wants to stop.  I will have her do her exercises at home. She has no paresthesias.  She has no swelling of the shoulder but complains of some right neck swelling at times.  HPI  Body mass index is 38.78 kg/m.  ROS  Review of Systems  HENT: Negative for congestion.   Respiratory: Positive for chest tightness and shortness of breath. Negative for cough.   Cardiovascular: Positive for palpitations. Negative for chest pain and leg swelling.  Endocrine: Positive for cold intolerance.  Musculoskeletal: Positive for arthralgias, joint swelling and neck pain.  Allergic/Immunologic: Positive for environmental allergies.  Psychiatric/Behavioral: Agitation: .wkov. The patient is nervous/anxious.     Past Medical History:  Diagnosis Date  . Anxiety   . CHF (congestive heart failure) ([place])   . Chronic abdominal pain   . Chronic headaches   . Complication of anesthesia   . Crohn's disease [facility]) June 2013  . Environmental allergies   . Family history of anesthesia complication    MH during C Section  . H/O colonoscopy   . History of electroencephalogram 03/2014   normal  . IBS (irritable bowel syndrome) June 2014  . 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 Migraine   . Obsessive-compulsive disorder   . PTSD (post-traumatic stress disorder)   . Seizures ([place])     Past Surgical History:  Procedure Laterality Date  . BLADDER SURGERY     age 68  . CESAREAN SECTION     X2  . COLONOSCOPY  05/22/2012   SLF: Polyps, multiple hyperplastic in the sigmoid colon/Polyp in the rectum/  MODERATE Diverticulosis throughout the colon/ Internal hemorrhoids  .  ESOPHAGOGASTRODUODENOSCOPY     remote, had ulcers, [place], Dr. Truddie Coco  . ESOPHAGOGASTRODUODENOSCOPY  05/22/2012   SLF: Stricture in the distal esophagus/Moderate gastritis  . PARTIAL HYSTERECTOMY    . TUBAL LIGATION      Family History  Problem Relation Age of Onset  . Colon cancer Maternal Grandfather     ?less than age 19s  . Heart attack Father   . Alcohol abuse Father   . Diabetes Mother   . Hypertension Mother   . Dementia Mother   . Depression Mother   . Liver disease Brother     does know details, but related to MVA?  Marland Kitchen Anxiety disorder Sister   . Bipolar disorder Sister   . OCD Sister   . Anxiety disorder Sister   . Depression Sister   . Malignant hyperthermia Daughter   . Heart murmur Son   . Inflammatory bowel disease Neg Hx   . ADD / ADHD Neg Hx   . Drug abuse Neg Hx   . Paranoid behavior Neg Hx   . Schizophrenia Neg Hx   . Seizures Neg Hx   . Sexual abuse Neg Hx   . Physical abuse Neg Hx   . Colon polyps Neg Hx     Social History Social History  Substance Use Topics  . Smoking status: Former Smoker    Quit date: 01/01/1989  . Smokeless tobacco: Never Used     Comment: Quit  a few years ago  . Alcohol use No     Comment: socially    Allergies  Allergen Reactions  . Aspirin Anaphylaxis  . Peanut-Containing Drug Products Anaphylaxis       . Penicillins Anaphylaxis    Stops her heart  . Sulfa Antibiotics Anaphylaxis  . Codeine Itching  . Adhesive [Tape] Rash  . Latex Rash  . Rubbing Alcohol [Alcohol] Rash    Current Outpatient Prescriptions  Medication Sig Dispense Refill  . albuterol (PROVENTIL HFA;VENTOLIN HFA) 108 (90 BASE) MCG/ACT inhaler Inhale 2 puffs into the lungs every 6 (six) hours as needed for wheezing or shortness of breath (wheezing).     . ALPRAZolam (XANAX) 0.5 MG tablet Take 0.5 mg by mouth 3 (three) times daily as needed for anxiety (anxiety).     . busPIRone (BUSPAR) 15 MG tablet Take 1 tablet (15 mg total) by mouth 4  (four) times daily. 120 tablet 1  . CARAFATE 1 GM/10ML suspension     . Carbamazepine (EQUETRO) 100 MG CP12 12 hr capsule Take 100 mg by mouth 2 (two) times daily.    . diphenhydrAMINE (BENADRYL) 25 MG tablet Take 25 mg by mouth every 6 (six) hours as needed for itching (itching).    Marland Kitchen divalproex (DEPAKOTE) 500 MG DR tablet Take 500 mg by mouth daily.    . DULoxetine (CYMBALTA) 60 MG capsule Take 60 mg by mouth daily.    Marland Kitchen EPINEPHrine (EPI-PEN) 0.3 mg/0.3 mL DEVI Inject 0.3 mg into the muscle as needed (for allergic reaction).     Marland Kitchen esomeprazole (NEXIUM) 40 MG capsule 1 PO 30 MINUTES PRIOR TO MEALS BID 60 capsule 11  . famotidine (PEPCID) 20 MG tablet Take 1 tablet (20 mg total) by mouth daily. 30 tablet 0  . furosemide (LASIX) 40 MG tablet     . gabapentin (NEURONTIN) 300 MG capsule Take 300 mg by mouth 3 (three) times daily.    Marland Kitchen HYDROcodone-acetaminophen (NORCO) 10-325 MG per tablet Take 1 tablet by mouth every 6 (six) hours as needed.    . hydrOXYzine (ATARAX/VISTARIL) 25 MG tablet Take 1 tablet (25 mg total) by mouth every 6 (six) hours as needed for itching. 12 tablet 0  . levocetirizine (XYZAL) 5 MG tablet Take 5 mg by mouth every evening.    . Linaclotide (LINZESS) 290 MCG CAPS capsule Take 290 mcg by mouth daily.    . metoprolol tartrate (LOPRESSOR) 25 MG tablet     . Olopatadine HCl 0.2 % SOLN Apply 1-2 drops to eye 2 (two) times daily.    Marland Kitchen omalizumab (XOLAIR) 150 MG injection Inject into the skin every 14 (fourteen) days.    . ondansetron (ZOFRAN) 4 MG tablet Take 1 tablet (4 mg total) by mouth every 6 (six) hours as needed for nausea. 30 tablet 1  . QUEtiapine (SEROQUEL) 300 MG tablet 300 mg. 1/2 tab in am and pm    . QUEtiapine (SEROQUEL) 400 MG tablet Take 400 mg by mouth at bedtime.    . topiramate (TOPAMAX) 100 MG tablet Take 100 mg by mouth 2 (two) times daily.    Marland Kitchen topiramate (TOPAMAX) 25 MG tablet Take 1 tablet (25 mg total) by mouth 2 (two) times daily. 60 tablet 3  .  traMADol (ULTRAM) 50 MG tablet Take 1 tablet (50 mg total) by mouth every 6 (six) hours as needed. 60 tablet 3  . UNABLE TO FIND CPAP at bedtime     No current facility-administered medications for this  visit.      Physical Exam  Blood pressure 138/87, pulse 70, temperature 97.7 F (36.5 C), height 5' 2"  (1.575 m), weight 212 lb (96.2 kg).  Constitutional: overall normal hygiene, normal nutrition, well developed, normal grooming, normal body habitus. Assistive device:none  Musculoskeletal: gait and station Limp none, muscle tone and strength are normal, no tremors or atrophy is present.  .  Neurological: coordination overall normal.  Deep tendon reflex/nerve stretch intact.  Sensation normal.  Cranial nerves II-XII intact.   Skin:   Normal overall no scars, lesions, ulcers or rashes. No psoriasis.  Psychiatric: Alert and oriented x 3.  Recent memory intact, remote memory unclear.  Normal mood and affect. Well groomed.  Good eye contact.  Cardiovascular: overall no swelling, no varicosities, no edema bilaterally, normal temperatures of the legs and arms, no clubbing, cyanosis and good capillary refill.  Lymphatic: palpation is normal.  Examination of right Upper Extremity is done.  Inspection:   Overall:  Elbow non-tender without crepitus or defects, forearm non-tender without crepitus or defects, wrist non-tender without crepitus or defects, hand non-tender.    Shoulder: with glenohumeral joint tenderness, without effusion.   Upper arm: without swelling and tenderness   Range of motion:   Overall:  Full range of motion of the elbow, full range of motion of wrist and full range of motion in fingers.   Shoulder:  right  140 degrees forward flexion; 90 degrees abduction; 35 degrees internal rotation, 35 degrees external rotation, 15 degrees extension, 40 degrees adduction.   Stability:   Overall:  Shoulder, elbow and wrist stable   Strength and Tone:   Overall full shoulder  muscles strength, full upper arm strength and normal upper arm bulk and tone.   The patient has been educated about the nature of the problem(s) and counseled on treatment options.  The patient appeared to understand what I have discussed and is in agreement with it.  Encounter Diagnosis  Name Primary?  . Pain in joint of right shoulder Yes    PLAN Call if any problems.  Precautions discussed.  Continue current medications.   Return to clinic 2 weeks  Begin Tramadol.   Electronically Signed Sanjuana Kava, MD 11/7/20171:57 PM

## 2016-08-24 NOTE — Therapy (Signed)
[facility] [place], [place], [address] Phone: [phone]   Fax:  [phone]  Physical Therapy Treatment  Patient Details  Name: Kathryn Olson MRN: [id number] Date of Birth: 12-16-1960 Referring Provider: Sanjuana Kava MD  Encounter Date: 08/24/2016      PT End of Session - 08/24/16 1149    Visit Number 2   Number of Visits 12   Date for PT Re-Evaluation 09/28/16   PT Start Time 1115   PT Stop Time 0947   PT Time Calculation (min) 50 min      Past Medical History:  Diagnosis Date  . Anxiety   . CHF (congestive heart failure) ([place])   . Chronic abdominal pain   . Chronic headaches   . Complication of anesthesia   . Crohn's disease [facility]) June 2013  . Environmental allergies   . Family history of anesthesia complication    MH during C Section  . H/O colonoscopy   . History of electroencephalogram 03/2014   normal  . IBS (irritable bowel syndrome) June 2014  . 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 Migraine   . Obsessive-compulsive disorder   . PTSD (post-traumatic stress disorder)   . Seizures ([place])     Past Surgical History:  Procedure Laterality Date  . BLADDER SURGERY     age 42  . CESAREAN SECTION     X2  . COLONOSCOPY  05/22/2012   SLF: Polyps, multiple hyperplastic in the sigmoid colon/Polyp in the rectum/  MODERATE Diverticulosis throughout the colon/ Internal hemorrhoids  . ESOPHAGOGASTRODUODENOSCOPY     remote, had ulcers, [place], Dr. Truddie Coco  . ESOPHAGOGASTRODUODENOSCOPY  05/22/2012   SLF: Stricture in the distal esophagus/Moderate gastritis  . PARTIAL HYSTERECTOMY    . TUBAL LIGATION      There were no vitals filed for this visit.      Subjective Assessment - 08/24/16 1127    Subjective The patient states that after eating at a restaurant on August 26th, 2017 she began to feel very poorly.  As it turns out she states she was having a cardiac  event and respiratory arrest.  The patient states that she spent 10 days in the hospital and remembers very little.  When she woke up she states she was feeling righ sided neck pain and pain into her right shoulder and down the length of her arm.  She also states that her swallowing and speech have been affected.  She has seen a Neurologist and does have a follow-up.  She reports she has limited use of her right UE and can raise it only to shoulder height.                           [facility] Adult PT Treatment/Exercise - 08/24/16 0001      Modalities   Modalities Electrical Stimulation;Moist Heat     Moist Heat Therapy   Number Minutes Moist Heat 15 Minutes   Moist Heat Location Shoulder     Electrical Stimulation   Electrical Stimulation Location RT shldr premod x 15 mins 80-150hz     Electrical Stimulation Goals Pain     Manual Therapy   Manual Therapy Passive ROM   Passive ROM PROM/ PAROM x 25 mins to RT shldr for elevation and ER in supine.Marland Kitchen  PT Long Term Goals - 08/17/16 1629      PT LONG TERM GOAL #1   Title Independent with a HEP.   Time 6   Period Weeks   Status New     PT LONG TERM GOAL #2   Title Full active ROM of right shoulder.   Time 6   Period Weeks   Status New     PT LONG TERM GOAL #3   Title Active ER to 70 degrees+ to allow for easily donning/doffing of apparel.   Time 6   Period Weeks   Status New     PT LONG TERM GOAL #4   Title Perform ADL's with pain not > 3/10.   Time 6   Period Weeks   Status New               Plan - 08/24/16 1135    Clinical Impression Statement Pt did fair with Rx today. We performed manual PROM and PAROM to RT SHLDR in supine. She was able to tolerate fairly well until pain started to increase in RT shldr and into her neck.  She reached about a 100 degrees i n elevation and 45 degrees in ER.Marland Kitchen Decreased pain after modalities with normal response.   Rehab Potential Poor    PT Frequency 2x / week   PT Duration 6 weeks   PT Treatment/Interventions ADLs/Self Care Home Management;Cryotherapy;Electrical Stimulation;Moist Heat;Ultrasound;Patient/family education;Neuromuscular re-education;Therapeutic exercise;Therapeutic activities;Manual techniques;Passive range of motion   PT Next Visit Plan AAROM right UE; modalities PRN.  No rubbing alcohol    Consulted and Agree with Plan of Care Patient      Patient will benefit from skilled therapeutic intervention in order to improve the following deficits and impairments:  Pain, Decreased activity tolerance, Decreased range of motion, Decreased strength  Visit Diagnosis: Acute pain of right shoulder  Cervicalgia     Problem List Patient Active Problem List   Diagnosis Date Noted  . Pain in right arm 08/04/2016  . Chest wall muscle strain 08/06/2015  . Crohn's disease ([place]) 09/04/2013  . IBS (irritable bowel syndrome) 09/04/2013  . Depression 09/04/2013  . Dysthymic disorder 01/29/2013  . PTSD (post-traumatic stress disorder) 01/29/2013  . Chronic back pain greater than 3 months duration 01/29/2013  . CNS disorder 01/29/2013  . Insomnia secondary to depression with anxiety 01/29/2013  . Insomnia secondary to chronic pain 01/29/2013  . Constipation 05/10/2012  . Weight loss, abnormal 05/10/2012  . Abdominal pain 05/10/2012  . Vomiting 05/10/2012  . GERD (gastroesophageal reflux disease) 05/10/2012    Kathryn Olson,Kathryn Olson, PTA 08/24/2016, 2:43 PM  [facility] [place], [place], [address] Phone: [phone]   Fax:  [phone]  Name: Kathryn Olson MRN: [id number] Date of Birth: May 12, 1961

## 2016-08-26 ENCOUNTER — Ambulatory Visit: Payer: Managed Care, Other (non HMO) | Admitting: *Deleted

## 2016-08-26 DIAGNOSIS — M25511 Pain in right shoulder: Secondary | ICD-10-CM | POA: Diagnosis not present

## 2016-08-26 DIAGNOSIS — M542 Cervicalgia: Secondary | ICD-10-CM

## 2016-08-26 NOTE — Therapy (Signed)
[facility] [place], [place], [address] Phone: [phone]   Fax:  [phone]  Physical Therapy Treatment  Patient Details  Name: Kathryn Olson MRN: [id number] Date of Birth: [birth year] Referring Provider: Sanjuana Kava MD  Encounter Date: 08/26/2016      PT End of Session - 08/26/16 1257    Visit Number 3   Number of Visits 12   Date for PT Re-Evaluation 09/28/16   PT Start Time 1300   PT Stop Time 1350   PT Time Calculation (min) 50 min      Past Medical History:  Diagnosis Date  . Anxiety   . CHF (congestive heart failure) ([place])   . Chronic abdominal pain   . Chronic headaches   . Complication of anesthesia   . Crohn's disease [facility]) June 2013  . Environmental allergies   . Family history of anesthesia complication    MH during C Section  . H/O colonoscopy   . History of electroencephalogram 03/2014   normal  . IBS (irritable bowel syndrome) June 2014  . 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 Migraine   . Obsessive-compulsive disorder   . PTSD (post-traumatic stress disorder)   . Seizures ([place])     Past Surgical History:  Procedure Laterality Date  . BLADDER SURGERY     age 38  . CESAREAN SECTION     X2  . COLONOSCOPY  05/22/2012   SLF: Polyps, multiple hyperplastic in the sigmoid colon/Polyp in the rectum/  MODERATE Diverticulosis throughout the colon/ Internal hemorrhoids  . ESOPHAGOGASTRODUODENOSCOPY     remote, had ulcers, [place], Dr. Truddie Coco  . ESOPHAGOGASTRODUODENOSCOPY  05/22/2012   SLF: Stricture in the distal esophagus/Moderate gastritis  . PARTIAL HYSTERECTOMY    . TUBAL LIGATION      There were no vitals filed for this visit.      Subjective Assessment - 08/26/16 1257    Subjective The patient states that after eating at a restaurant on August 26th, 2017 she began to feel very poorly.  As it turns out she states she was having a cardiac  event and respiratory arrest.  The patient states that she spent 10 days in the hospital and remembers very little.  When she woke up she states she was feeling righ sided neck pain and pain into her right shoulder and down the length of her arm.  She also states that her swallowing and speech have been affected.  She has seen a Neurologist and does have a follow-up.  She reports she has limited use of her right UE and can raise it only to shoulder height.              [facility] PT Assessment - 08/26/16 0001      PROM   Overall PROM Comments flexion to 145 degrees and ER to 70 degrees                     [facility] Adult PT Treatment/Exercise - 08/26/16 0001      Exercises   Exercises Shoulder     Modalities   Modalities Electrical Stimulation;Moist Heat     Moist Heat Therapy   Number Minutes Moist Heat 15 Minutes   Moist Heat Location Shoulder     Electrical Stimulation   Electrical Stimulation Location RT shldr premod x 15 mins 80-150hz     Electrical Stimulation Goals Pain     Manual  Therapy   Manual Therapy Passive ROM   Passive ROM PROM/ PAROM/ AAROM x 25 mins to RT shldr for elevation and ER in supine..                      PT Long Term Goals - 08/17/16 1629      PT LONG TERM GOAL #1   Title Independent with a HEP.   Time 6   Period Weeks   Status New     PT LONG TERM GOAL #2   Title Full active ROM of right shoulder.   Time 6   Period Weeks   Status New     PT LONG TERM GOAL #3   Title Active ER to 70 degrees+ to allow for easily donning/doffing of apparel.   Time 6   Period Weeks   Status New     PT LONG TERM GOAL #4   Title Perform ADL's with pain not > 3/10.   Time 6   Period Weeks   Status New               Plan - 08/26/16 1258    Clinical Impression Statement Pt did better with Rx today and was able to perform some AAROM exs and during manual. Her RT shldr pain was less today and her C/C was pain on the RT side of her  neck when swallowing. Goals are ongoing   Rehab Potential Poor   PT Frequency 2x / week   PT Duration 6 weeks   PT Treatment/Interventions ADLs/Self Care Home Management;Cryotherapy;Electrical Stimulation;Moist Heat;Ultrasound;Patient/family education;Neuromuscular re-education;Therapeutic exercise;Therapeutic activities;Manual techniques;Passive range of motion   PT Next Visit Plan AAROM right UE; modalities PRN.  No rubbing alcohol    Consulted and Agree with Plan of Care Patient      Patient will benefit from skilled therapeutic intervention in order to improve the following deficits and impairments:  Pain, Decreased activity tolerance, Decreased range of motion, Decreased strength  Visit Diagnosis: Acute pain of right shoulder  Cervicalgia     Problem List Patient Active Problem List   Diagnosis Date Noted  . Pain in right arm 08/04/2016  . Chest wall muscle strain 08/06/2015  . Crohn's disease ([place]) 09/04/2013  . IBS (irritable bowel syndrome) 09/04/2013  . Depression 09/04/2013  . Dysthymic disorder 01/29/2013  . PTSD (post-traumatic stress disorder) 01/29/2013  . Chronic back pain greater than 3 months duration 01/29/2013  . CNS disorder 01/29/2013  . Insomnia secondary to depression with anxiety 01/29/2013  . Insomnia secondary to chronic pain 01/29/2013  . Constipation 05/10/2012  . Weight loss, abnormal 05/10/2012  . Abdominal pain 05/10/2012  . Vomiting 05/10/2012  . GERD (gastroesophageal reflux disease) 05/10/2012    Muntaha Vermette,CHRIS, PTA 08/26/2016, 1:51 PM  [facility] [place], [place], [address] Phone: [phone]   Fax:  [phone]  Name: Kathryn Olson MRN: [id number] Date of Birth: 09/07/1961

## 2016-08-31 ENCOUNTER — Ambulatory Visit: Payer: Managed Care, Other (non HMO) | Admitting: Physical Therapy

## 2016-08-31 DIAGNOSIS — M25511 Pain in right shoulder: Secondary | ICD-10-CM | POA: Diagnosis not present

## 2016-08-31 DIAGNOSIS — M542 Cervicalgia: Secondary | ICD-10-CM

## 2016-08-31 NOTE — Therapy (Addendum)
[facility] [place], [place], [address] Phone: [phone]   Fax:  [phone]  Physical Therapy Treatment  Patient Details  Name: Kathryn Olson MRN: [id number] Date of Birth: 1961/01/17 Referring Provider: Sanjuana Kava MD  Encounter Date: 08/31/2016    Past Medical History:  Diagnosis Date  . Anxiety   . CHF (congestive heart failure) ([place])   . Chronic abdominal pain   . Chronic headaches   . Complication of anesthesia   . Crohn's disease [facility]) June 2013  . Environmental allergies   . Family history of anesthesia complication    MH during C Section  . H/O colonoscopy   . History of electroencephalogram 03/2014   normal  . IBS (irritable bowel syndrome) June 2014  . 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 Migraine   . Obsessive-compulsive disorder   . PTSD (post-traumatic stress disorder)   . Seizures ([place])     Past Surgical History:  Procedure Laterality Date  . BLADDER SURGERY     age 45  . CESAREAN SECTION     X2  . COLONOSCOPY  05/22/2012   SLF: Polyps, multiple hyperplastic in the sigmoid colon/Polyp in the rectum/  MODERATE Diverticulosis throughout the colon/ Internal hemorrhoids  . ESOPHAGOGASTRODUODENOSCOPY     remote, had ulcers, [place], Dr. Truddie Coco  . ESOPHAGOGASTRODUODENOSCOPY  05/22/2012   SLF: Stricture in the distal esophagus/Moderate gastritis  . PARTIAL HYSTERECTOMY    . TUBAL LIGATION      There were no vitals filed for this visit.                                    PT Long Term Goals - 08/31/16 1501      PT LONG TERM GOAL #1   Title Independent with a HEP.   Time 6   Period Weeks   Status On-going     PT LONG TERM GOAL #2   Title Full active ROM of right shoulder.   Time 6   Period Weeks   Status On-going     PT LONG TERM GOAL #3   Title Active ER to 70 degrees+ to allow for easily donning/doffing of  apparel.   Time 6   Period Weeks   Status On-going     PT LONG TERM GOAL #4   Title Perform ADL's with pain not > 3/10.   Time 6   Period Weeks   Status On-going             Patient will benefit from skilled therapeutic intervention in order to improve the following deficits and impairments:  Pain, Decreased activity tolerance, Decreased range of motion, Decreased strength  Visit Diagnosis: Acute pain of right shoulder  Cervicalgia     Problem List Patient Active Problem List   Diagnosis Date Noted  . Pain in right arm 08/04/2016  . Chest wall muscle strain 08/06/2015  . Crohn's disease ([place]) 09/04/2013  . IBS (irritable bowel syndrome) 09/04/2013  . Depression 09/04/2013  . Dysthymic disorder 01/29/2013  . PTSD (post-traumatic stress disorder) 01/29/2013  . Chronic back pain greater than 3 months duration 01/29/2013  . CNS disorder 01/29/2013  . Insomnia secondary to depression with anxiety 01/29/2013  . Insomnia secondary to chronic pain 01/29/2013  . Constipation 05/10/2012  . Weight loss, abnormal 05/10/2012  . Abdominal pain 05/10/2012  .  Vomiting 05/10/2012  . GERD (gastroesophageal reflux disease) 05/10/2012    Ali Mohl, [place] MPT 11/03/2016, 4:15 PM  [facility] [address] [place], [place], [address] Phone: [phone]   Fax:  [phone]  Name: Kathryn Olson MRN: [id number] Date of Birth: 1961-05-11 PHYSICAL THERAPY DISCHARGE SUMMARY  Visits from Start of Care: 4.  Current functional level related to goals / functional outcomes: See above.   Remaining deficits: No goals met.    Education / Equipment: HEP. Plan: Patient agrees to discharge.  Patient goals were not met. Patient is being discharged due to lack of progress.  ?????         Mali Antonious Omahoney MPT

## 2016-09-07 ENCOUNTER — Ambulatory Visit (INDEPENDENT_AMBULATORY_CARE_PROVIDER_SITE_OTHER): Payer: Managed Care, Other (non HMO) | Admitting: Orthopaedic Surgery

## 2016-09-07 VITALS — BP 124/77 | HR 60 | Temp 97.5°F | Ht 62.0 in | Wt 214.0 lb

## 2016-09-07 DIAGNOSIS — M25511 Pain in right shoulder: Secondary | ICD-10-CM | POA: Diagnosis not present

## 2016-09-07 NOTE — Progress Notes (Signed)
CC:  My shoulder is no better  She has continued pain of the right shoulder.  She has been to OT/PT.  She has done exercises at home.  She has taken medicine. She has no paresthesias.  She continues to have pain and decreased motion.  NV intact.  She has pain in ROM.  She has no redness.  Encounter Diagnosis  Name Primary?  . Pain in joint of right shoulder Yes   PROCEDURE NOTE:  The patient request injection, verbal consent was obtained.  The right shoulder was prepped appropriately after time out was performed.   Sterile technique was observed and injection of 1 cc of Depo-Medrol 40 mg with several cc's of plain xylocaine. Anesthesia was provided by ethyl chloride and a 20-gauge needle was used to inject the shoulder area. A posterior approach was used.  The injection was tolerated well.  A band aid dressing was applied.  The patient was advised to apply ice later today and tomorrow to the injection sight as needed.  I would like to have MRI of the right shoulder.  I am concerned of rotator cuff tear.  She is not better with OT/PT, medicine, rest, home exercises.  Return after MRI.  Electronically Signed Sanjuana Kava, MD 11/21/20174:15 PM

## 2016-09-15 ENCOUNTER — Telehealth: Payer: Self-pay | Admitting: Radiology

## 2016-09-15 MED ORDER — DIAZEPAM 10 MG PO TABS: ORAL_TABLET | ORAL | 0 refills | Status: DC

## 2016-09-15 NOTE — Telephone Encounter (Signed)
Request for medication sent to Dr. Luna Glasgow.  Called GI and made sure she is scheduled in an open unit.  Called patient back and told her to check with her pharmacy for the RX and assured her it is scheduled in an open unit.

## 2016-09-15 NOTE — Telephone Encounter (Signed)
MRI is scheduled at [place] for RT shoulder but the patient is still afraid and request medication to take to help her calm her severe claustrophobia prior to the exam.

## 2016-09-15 NOTE — Addendum Note (Signed)
Addended by: Willette Pa on: 09/15/2016 11:25 AM   Modules accepted: Orders

## 2016-09-15 NOTE — Telephone Encounter (Signed)
MRI is scheduled at [place] for RT shoulder but the patient is still afraid and request medication to take to help her calm her severe claustrophobia prior to the exam.

## 2016-09-19 ENCOUNTER — Emergency Department (HOSPITAL_COMMUNITY)
Admission: EM | Admit: 2016-09-19 | Discharge: 2016-09-19 | Disposition: A | Discharge status: Home / Self Care | Payer: Managed Care, Other (non HMO) | Attending: Emergency Medicine | Admitting: Emergency Medicine

## 2016-09-19 ENCOUNTER — Emergency Department (HOSPITAL_COMMUNITY): Payer: Managed Care, Other (non HMO)

## 2016-09-19 ENCOUNTER — Encounter (HOSPITAL_COMMUNITY): Payer: Self-pay | Admitting: *Deleted

## 2016-09-19 DIAGNOSIS — I509 Heart failure, unspecified: Secondary | ICD-10-CM | POA: Diagnosis not present

## 2016-09-19 DIAGNOSIS — Z79899 Other long term (current) drug therapy: Secondary | ICD-10-CM | POA: Diagnosis not present

## 2016-09-19 DIAGNOSIS — Z87891 Personal history of nicotine dependence: Secondary | ICD-10-CM | POA: Insufficient documentation

## 2016-09-19 DIAGNOSIS — J029 Acute pharyngitis, unspecified: Secondary | ICD-10-CM | POA: Insufficient documentation

## 2016-09-19 LAB — RAPID STREP SCREEN (MED CTR MEBANE ONLY): STREPTOCOCCUS, GROUP A SCREEN (DIRECT): NEGATIVE

## 2016-09-19 LAB — I-STAT CHEM 8, ED
BUN: 13 mg/dL (ref 6–20)
Calcium, Ion: 1.18 mmol/L (ref 1.15–1.40)
Chloride: 104 mmol/L (ref 101–111)
Creatinine, Ser: 0.8 mg/dL (ref 0.44–1.00)
Glucose, Bld: 190 mg/dL — ABNORMAL HIGH (ref 65–99)
HEMATOCRIT: 38 % (ref 36.0–46.0)
HEMOGLOBIN: 12.9 g/dL (ref 12.0–15.0)
Potassium: 4 mmol/L (ref 3.5–5.1)
SODIUM: 140 mmol/L (ref 135–145)
TCO2: 23 mmol/L (ref 0–100)

## 2016-09-19 MED ORDER — ACETAMINOPHEN 500 MG PO TABS
ORAL_TABLET | ORAL | Status: AC
  Filled 2016-09-19: qty 1

## 2016-09-19 MED ORDER — ACETAMINOPHEN 325 MG PO TABS
650.0000 mg | ORAL_TABLET | Freq: Once | ORAL | Status: AC
  Administered 2016-09-19: 650 mg via ORAL
  Filled 2016-09-19: qty 2

## 2016-09-19 MED ORDER — ONDANSETRON 4 MG PO TBDP
4.0000 mg | ORAL_TABLET | Freq: Once | ORAL | Status: AC
  Administered 2016-09-19: 4 mg via ORAL
  Filled 2016-09-19: qty 1

## 2016-09-19 MED ORDER — DEXAMETHASONE SODIUM PHOSPHATE 4 MG/ML IJ SOLN
10.0000 mg | Freq: Once | INTRAMUSCULAR | Status: AC
  Administered 2016-09-19: 10 mg via INTRAMUSCULAR
  Filled 2016-09-19: qty 3

## 2016-09-19 MED ORDER — HYDROCODONE-ACETAMINOPHEN 5-325 MG PO TABS
1.0000 | ORAL_TABLET | Freq: Once | ORAL | Status: AC
  Administered 2016-09-19: 1 via ORAL
  Filled 2016-09-19: qty 1

## 2016-09-19 MED ORDER — IOPAMIDOL (ISOVUE-300) INJECTION 61%
75.0000 mL | Freq: Once | INTRAVENOUS | Status: AC | PRN
  Administered 2016-09-19: 75 mL via INTRAVENOUS

## 2016-09-19 MED ORDER — ONDANSETRON 4 MG PO TBDP: ORAL_TABLET | ORAL | 0 refills | Status: DC

## 2016-09-19 MED ORDER — ACETAMINOPHEN 325 MG PO TABS
ORAL_TABLET | ORAL | Status: AC
  Filled 2016-09-19: qty 1

## 2016-09-19 NOTE — ED Notes (Signed)
Return from CT

## 2016-09-19 NOTE — ED Notes (Signed)
Pt given ginger ale to drink

## 2016-09-19 NOTE — ED Provider Notes (Signed)
[facility] DEPT Provider Note   CSN: [id number] Arrival date & time: 09/19/16  9379     History   Chief Complaint Chief Complaint  Patient presents with  . Generalized Body Aches    HPI Kathryn Olson is a 55 y.o. female.  Patient presents with a four-day history of worsening sore throat, chills, bilateral ear pain, chest congestion and chest pain with coughing. Cough productive of yellow mucous. Symptoms started 2 days after receiving a flu shot on Monday of this past week. Has had chills but has not checked her temperature. Felt well prior to the flu vaccine as never received a flu vaccine before. She has a hoarse voice, sore throat, bilateral ear pain. Denies headache. Denies shortness of breath. Only has chest pain with coughing. No abdominal pain, nausea, vomiting or diarrhea. Denies any leg pain or leg swelling. Has not tried any medications at home due to her multiple allergies. No sick contacts or recent travel.   The history is provided by the patient and a relative.    Past Medical History:  Diagnosis Date  . Anxiety   . CHF (congestive heart failure) ([place])   . Chronic abdominal pain   . Chronic headaches   . Complication of anesthesia   . Crohn's disease [facility]) June 2013  . Environmental allergies   . Family history of anesthesia complication    MH during C Section  . H/O colonoscopy   . History of electroencephalogram 03/2014   normal  . IBS (irritable bowel syndrome) June 2014  . 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 Migraine   . Obsessive-compulsive disorder   . PTSD (post-traumatic stress disorder)   . Seizures [facility])     Patient Active Problem List   Diagnosis Date Noted  . Pain in right arm 08/04/2016  . Chest wall muscle strain 08/06/2015  . Crohn's disease ([place]) 09/04/2013  . IBS (irritable bowel syndrome) 09/04/2013  . Depression 09/04/2013  . Dysthymic disorder 01/29/2013  . PTSD (post-traumatic stress  disorder) 01/29/2013  . Chronic back pain greater than 3 months duration 01/29/2013  . CNS disorder 01/29/2013  . Insomnia secondary to depression with anxiety 01/29/2013  . Insomnia secondary to chronic pain 01/29/2013  . Constipation 05/10/2012  . Weight loss, abnormal 05/10/2012  . Abdominal pain 05/10/2012  . Vomiting 05/10/2012  . GERD (gastroesophageal reflux disease) 05/10/2012    Past Surgical History:  Procedure Laterality Date  . BLADDER SURGERY     age 55  . CESAREAN SECTION     X2  . COLONOSCOPY  05/22/2012   SLF: Polyps, multiple hyperplastic in the sigmoid colon/Polyp in the rectum/  MODERATE Diverticulosis throughout the colon/ Internal hemorrhoids  . ESOPHAGOGASTRODUODENOSCOPY     remote, had ulcers, [place], Dr. Truddie Coco  . ESOPHAGOGASTRODUODENOSCOPY  05/22/2012   SLF: Stricture in the distal esophagus/Moderate gastritis  . PARTIAL HYSTERECTOMY    . TUBAL LIGATION      OB History    No data available       Home Medications    Prior to Admission medications   Medication Sig Start Date End Date Taking? Authorizing Provider  albuterol (PROVENTIL HFA;VENTOLIN HFA) 108 (90 BASE) MCG/ACT inhaler Inhale 2 puffs into the lungs every 6 (six) hours as needed for wheezing or shortness of breath (wheezing).     Historical Provider, MD  ALPRAZolam Duanne Moron) 0.5 MG tablet Take 0.5 mg by mouth 3 (three) times daily as needed  for anxiety (anxiety).  09/04/13   Mary-Margaret Hassell Done, FNP  busPIRone (BUSPAR) 15 MG tablet Take 1 tablet (15 mg total) by mouth 4 (four) times daily. 09/04/13   Mary-Margaret Hassell Done, FNP  CARAFATE 1 GM/10ML suspension  06/25/16   Historical Provider, MD  Carbamazepine (EQUETRO) 100 MG CP12 12 hr capsule Take 100 mg by mouth 2 (two) times daily.    Historical Provider, MD  diazepam (VALIUM) 10 MG tablet Take one tablet about an hour prior to MRI examination.  Will cause drowsiness.  Do not drive or operate machinery. 09/15/16   Sanjuana Kava, MD    diphenhydrAMINE (BENADRYL) 25 MG tablet Take 25 mg by mouth every 6 (six) hours as needed for itching (itching).    Historical Provider, MD  divalproex (DEPAKOTE) 500 MG DR tablet Take 500 mg by mouth daily.    Historical Provider, MD  DULoxetine (CYMBALTA) 60 MG capsule Take 60 mg by mouth daily.    Historical Provider, MD  EPINEPHrine (EPI-PEN) 0.3 mg/0.3 mL DEVI Inject 0.3 mg into the muscle as needed (for allergic reaction).     Historical Provider, MD  esomeprazole (NEXIUM) 40 MG capsule 1 PO 30 MINUTES PRIOR TO MEALS BID 01/28/16   Danie Binder, MD  famotidine (PEPCID) 20 MG tablet Take 1 tablet (20 mg total) by mouth daily. 08/19/14   Merryl Hacker, MD  furosemide (LASIX) 40 MG tablet  07/12/16   Historical Provider, MD  gabapentin (NEURONTIN) 300 MG capsule Take 300 mg by mouth 3 (three) times daily.    Historical Provider, MD  HYDROcodone-acetaminophen (NORCO) 10-325 MG per tablet Take 1 tablet by mouth every 6 (six) hours as needed.    Historical Provider, MD  hydrOXYzine (ATARAX/VISTARIL) 25 MG tablet Take 1 tablet (25 mg total) by mouth every 6 (six) hours as needed for itching. 08/11/14   Francine Graven, DO  levocetirizine (XYZAL) 5 MG tablet Take 5 mg by mouth every evening.    Historical Provider, MD  Linaclotide Rolan Lipa) 290 MCG CAPS capsule Take 290 mcg by mouth daily. 07/04/14   Danie Binder, MD  metoprolol tartrate (LOPRESSOR) 25 MG tablet  07/19/16   Historical Provider, MD  Olopatadine HCl 0.2 % SOLN Apply 1-2 drops to eye 2 (two) times daily.    Historical Provider, MD  omalizumab Arvid Right) 150 MG injection Inject into the skin every 14 (fourteen) days.    Historical Provider, MD  ondansetron (ZOFRAN) 4 MG tablet Take 1 tablet (4 mg total) by mouth every 6 (six) hours as needed for nausea. 02/21/14   Danie Binder, MD  QUEtiapine (SEROQUEL) 300 MG tablet 300 mg. 1/2 tab in am and pm 06/23/16   Historical Provider, MD  QUEtiapine (SEROQUEL) 400 MG tablet Take 400 mg by mouth  at bedtime.    Historical Provider, MD  topiramate (TOPAMAX) 100 MG tablet Take 100 mg by mouth 2 (two) times daily.    Historical Provider, MD  topiramate (TOPAMAX) 25 MG tablet Take 1 tablet (25 mg total) by mouth 2 (two) times daily. 04/16/14   Drema Dallas, DO  traMADol (ULTRAM) 50 MG tablet Take 1 tablet (50 mg total) by mouth every 6 (six) hours as needed. 08/24/16   Sanjuana Kava, MD  UNABLE TO FIND CPAP at bedtime    Historical Provider, MD    Family History Family History  Problem Relation Age of Onset  . Colon cancer Maternal Grandfather     ?less than age 71s  . Heart  attack Father   . Alcohol abuse Father   . Diabetes Mother   . Hypertension Mother   . Dementia Mother   . Depression Mother   . Liver disease Brother     does know details, but related to MVA?  Marland Kitchen Anxiety disorder Sister   . Bipolar disorder Sister   . OCD Sister   . Anxiety disorder Sister   . Depression Sister   . Malignant hyperthermia Daughter   . Heart murmur Son   . Inflammatory bowel disease Neg Hx   . ADD / ADHD Neg Hx   . Drug abuse Neg Hx   . Paranoid behavior Neg Hx   . Schizophrenia Neg Hx   . Seizures Neg Hx   . Sexual abuse Neg Hx   . Physical abuse Neg Hx   . Colon polyps Neg Hx     Social History Social History  Substance Use Topics  . Smoking status: Former Smoker    Quit date: 01/01/1989  . Smokeless tobacco: Never Used     Comment: Quit a few years ago  . Alcohol use No     Comment: socially     Allergies   Aspirin; Peanut-containing drug products; Penicillins; Sulfa antibiotics; Codeine; Adhesive [tape]; Latex; and Rubbing alcohol [alcohol]   Review of Systems Review of Systems  Constitutional: Positive for activity change, appetite change and chills. Negative for fever.  HENT: Positive for congestion, rhinorrhea, sore throat, trouble swallowing and voice change. Negative for facial swelling, sinus pain and sinus pressure.   Respiratory: Negative for chest tightness  and shortness of breath.   Cardiovascular: Negative for chest pain.  Gastrointestinal: Negative for abdominal pain, nausea and vomiting.  Genitourinary: Negative for dysuria, hematuria, vaginal bleeding and vaginal discharge.  Musculoskeletal: Negative for arthralgias and myalgias.  Neurological: Negative for dizziness, weakness and headaches.  A complete 10 system review of systems was obtained and all systems are negative except as noted in the HPI and PMH.     Physical Exam Updated Vital Signs BP 141/58 (BP Location: Left Arm)   Pulse 78   Temp 98.7 F (37.1 C) (Oral)   Resp 16   Ht 5' 2"  (1.575 m)   Wt 214 lb (97.1 kg)   SpO2 95%   BMI 39.14 kg/m   Physical Exam  Constitutional: She is oriented to person, place, and time. She appears well-developed and well-nourished. No distress.  Hoarse voice  HENT:  Head: Normocephalic and atraumatic.  Right Ear: External ear normal.  Left Ear: External ear normal.  Mouth/Throat: Oropharynx is clear and moist. No oropharyngeal exudate.  Erythema to posterior pharynx. No exudates. Mildly enlarge uvula. Floor of mouth soft.  TM normal bilaterally  Eyes: Conjunctivae and EOM are normal. Pupils are equal, round, and reactive to light.  Neck: Normal range of motion. Neck supple.  No meningismus.  Cardiovascular: Normal rate, regular rhythm, normal heart sounds and intact distal pulses.   No murmur heard. Pulmonary/Chest: Effort normal and breath sounds normal. No stridor. No respiratory distress. She has no wheezes. She exhibits tenderness.  Abdominal: Soft. There is no tenderness. There is no rebound and no guarding.  Musculoskeletal: Normal range of motion. She exhibits no edema or tenderness.  Lymphadenopathy:    She has cervical adenopathy.  Neurological: She is alert and oriented to person, place, and time. No cranial nerve deficit. She exhibits normal muscle tone. Coordination normal.  No ataxia on finger to nose bilaterally. No  pronator drift. 5/5 strength  throughout. CN 2-12 intact.Equal grip strength. Sensation intact.   Skin: Skin is warm.  Psychiatric: She has a normal mood and affect. Her behavior is normal.  Nursing note and vitals reviewed.    ED Treatments / Results  Labs (all labs ordered are listed, but only abnormal results are displayed) Labs Reviewed  RAPID STREP SCREEN (NOT AT [facility])  CULTURE, GROUP A STREP [facility]  I-STAT CHEM 8, ED    EKG  EKG Interpretation  Date/Time:  Sunday September 19 2016 06:06:19 EST Ventricular Rate:  79 PR Interval:    QRS Duration: 70 QT Interval:  381 QTC Calculation: 437 R Axis:   42 Text Interpretation:  Sinus rhythm No significant change was found Confirmed by Wyvonnia Dusky  MD, Sabra Sessler [phone]) on 09/19/2016 6:23:13 AM       Radiology Dg Neck Soft Tissue  Result Date: 09/19/2016 CLINICAL DATA:  55 year old female with history of sore throat and voice changes for the past 5 days. EXAM: NECK SOFT TISSUES - 1+ VIEW COMPARISON:  No priors. FINDINGS: On the lateral projection there is fullness in the region of the posterior aspect of the oropharynx. However, this fails to completely obscure the normal prevertebral stripe. There is no evidence of epiglottic enlargement. The cervical airway is unremarkable and no radio-opaque foreign body identified. IMPRESSION: 1. Fullness in the posterior aspect of the oropharynx is of uncertain etiology and significance, but further evaluation with contrast-enhanced CT of the neck is recommended at this time. Electronically Signed   By: Vinnie Langton M.D.   On: 09/19/2016 07:39   Dg Chest 2 View  Result Date: 09/19/2016 CLINICAL DATA:  Acute onset of generalized body aches, sore throat and earaches. Initial encounter. EXAM: CHEST  2 VIEW COMPARISON:  Chest radiograph performed 12/16/2013 FINDINGS: The lungs are well-aerated. Mild bibasilar opacities likely reflect atelectasis. There is no evidence of pleural effusion or  pneumothorax. The heart is borderline normal in size. No acute osseous abnormalities are seen. IMPRESSION: Mild bibasilar opacities likely reflect atelectasis. Lungs otherwise clear. Electronically Signed   By: Garald Balding M.D.   On: 09/19/2016 06:29    Procedures Procedures (including critical care time)  Medications Ordered in ED Medications  acetaminophen (TYLENOL) tablet 650 mg (not administered)  dexamethasone (DECADRON) injection 10 mg (not administered)     Initial Impression / Assessment and Plan / ED Course  I have reviewed the triage vital signs and the nursing notes.  Pertinent labs & imaging results that were available during my care of the patient were reviewed by me and considered in my medical decision making (see chart for details).  Clinical Course   Chills, sore throat, ear pain, cough, chest pain with coughing x 4 days after receiving flu shot 6 days ago. EKG unchanged.    CXR negative. Rapid strep negative. Decadron given.  Minimal uvular swelling.  She is not on an ACE inhibitor.  With sore throat and voice change, soft tissue neck Xray obtained.   Epiglottis appears normal but abnormal fullness in posterior OP.  CT scan will be obtained. Care transferred to Dr. Roderic Palau at shift change.  Final Clinical Impressions(s) / ED Diagnoses   Final diagnoses:  Pharyngitis, unspecified etiology    New Prescriptions New Prescriptions   No medications on file     Ezequiel Essex, MD 09/19/16 [phone]

## 2016-09-19 NOTE — Discharge Instructions (Addendum)
Tylenol of motrin for pain and fever.  Drink plenty of fluids and follow up in 2-3 days if not improving.

## 2016-09-19 NOTE — ED Notes (Signed)
Complain of abdominal pain and nausea. EDP aware.

## 2016-09-19 NOTE — ED Notes (Signed)
Tylenol dropped while administering - pill contaminated wasted with another drawn from the pixis and given to pt

## 2016-09-19 NOTE — ED Triage Notes (Signed)
Pt states she received her flu shot last Monday and has been having sore throat, with chills, bilateral earache and chest congestion and pressure;

## 2016-09-19 NOTE — ED Notes (Signed)
Pt reports flu shot this week, now with body aches, sore throat, and earaches. She has taken no OTC medication for relief.

## 2016-09-22 LAB — CULTURE, GROUP A STREP (THRC)

## 2016-09-28 ENCOUNTER — Ambulatory Visit
Admission: RE | Admit: 2016-09-28 | Discharge: 2016-09-28 | Disposition: A | Discharge status: Home / Self Care | Payer: Managed Care, Other (non HMO) | Source: Ambulatory Visit | Attending: Orthopaedic Surgery | Admitting: Orthopaedic Surgery

## 2016-09-28 DIAGNOSIS — M25511 Pain in right shoulder: Secondary | ICD-10-CM

## 2016-09-30 ENCOUNTER — Encounter: Payer: Self-pay | Admitting: Orthopaedic Surgery

## 2016-09-30 ENCOUNTER — Ambulatory Visit (INDEPENDENT_AMBULATORY_CARE_PROVIDER_SITE_OTHER): Payer: Managed Care, Other (non HMO) | Admitting: Orthopaedic Surgery

## 2016-09-30 VITALS — BP 139/82 | HR 49 | Temp 97.5°F | Ht 62.0 in | Wt 213.0 lb

## 2016-09-30 DIAGNOSIS — M25511 Pain in right shoulder: Secondary | ICD-10-CM

## 2016-09-30 NOTE — Progress Notes (Signed)
Patient Kathryn Olson, female DOB:03-Sep-1961, 55 y.o. KTG:[id number]  Chief Complaint  Patient presents with  . Follow-up    MRI review Rt shoulder    HPI  Kathryn Olson is a 55 y.o. female who has right shoulder pain.  She had a MRI done which shows: IMPRESSION: Rotator cuff tendinopathy without tear appears worst in the supraspinatus.  Mild to moderate acromioclavicular and mild glenohumeral osteoarthritis.  Subacromial/subdeltoid fluid consistent with bursitis.  I have explained the findings to her.  She does not need surgery.  She cannot take any NSAID which limits my treatment options for her. HPI  Body mass index is 38.96 kg/m.  ROS  Review of Systems  HENT: Negative for congestion.   Respiratory: Positive for chest tightness and shortness of breath. Negative for cough.   Cardiovascular: Positive for palpitations. Negative for chest pain and leg swelling.  Endocrine: Positive for cold intolerance.  Musculoskeletal: Positive for arthralgias, joint swelling and neck pain.  Allergic/Immunologic: Positive for environmental allergies.  Psychiatric/Behavioral: Agitation: .wkov. The patient is nervous/anxious.     Past Medical History:  Diagnosis Date  . Anxiety   . CHF (congestive heart failure) ([place])   . Chronic abdominal pain   . Chronic headaches   . Complication of anesthesia   . Crohn's disease [facility]) June 2013  . Environmental allergies   . Family history of anesthesia complication    MH during C Section  . H/O colonoscopy   . History of electroencephalogram 03/2014   normal  . IBS (irritable bowel syndrome) June 2014  . 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 Migraine   . Obsessive-compulsive disorder   . PTSD (post-traumatic stress disorder)   . Seizures ([place])     Past Surgical History:  Procedure Laterality Date  . BLADDER SURGERY     age 61  . CESAREAN SECTION     X2  . COLONOSCOPY  05/22/2012   SLF:  Polyps, multiple hyperplastic in the sigmoid colon/Polyp in the rectum/  MODERATE Diverticulosis throughout the colon/ Internal hemorrhoids  . ESOPHAGOGASTRODUODENOSCOPY     remote, had ulcers, [place], Dr. Truddie Coco  . ESOPHAGOGASTRODUODENOSCOPY  05/22/2012   SLF: Stricture in the distal esophagus/Moderate gastritis  . PARTIAL HYSTERECTOMY    . TUBAL LIGATION      Family History  Problem Relation Age of Onset  . Colon cancer Maternal Grandfather     ?less than age 17s  . Heart attack Father   . Alcohol abuse Father   . Diabetes Mother   . Hypertension Mother   . Dementia Mother   . Depression Mother   . Liver disease Brother     does know details, but related to MVA?  Marland Kitchen Anxiety disorder Sister   . Bipolar disorder Sister   . OCD Sister   . Anxiety disorder Sister   . Depression Sister   . Malignant hyperthermia Daughter   . Heart murmur Son   . Inflammatory bowel disease Neg Hx   . ADD / ADHD Neg Hx   . Drug abuse Neg Hx   . Paranoid behavior Neg Hx   . Schizophrenia Neg Hx   . Seizures Neg Hx   . Sexual abuse Neg Hx   . Physical abuse Neg Hx   . Colon polyps Neg Hx     Social History Social History  Substance Use Topics  . Smoking status: Former Smoker    Quit date: 01/01/1989  .  Smokeless tobacco: Never Used     Comment: Quit a few years ago  . Alcohol use No     Comment: socially    Allergies  Allergen Reactions  . Aspirin Anaphylaxis  . Peanut-Containing Drug Products Anaphylaxis       . Penicillins Anaphylaxis    Stops her heart. Has patient had a PCN reaction causing immediate rash, facial/tongue/throat swelling, SOB or lightheadedness with hypotension: yes Has patient had a PCN reaction causing severe rash involving mucus membranes or skin necrosis: no Has patient had a PCN reaction that required hospitalization: yes Has patient had a PCN reaction occurring within the last 10 years: yes If all of the above answers are "NO", then may proceed with  Cephalosporin use.   . Sulfa Antibiotics Anaphylaxis  . Codeine Itching  . Adhesive [Tape] Rash  . Latex Rash  . Rubbing Alcohol [Alcohol] Rash    Current Outpatient Prescriptions  Medication Sig Dispense Refill  . amitriptyline (ELAVIL) 50 MG tablet Take 1 tablet by mouth daily.    . divalproex (DEPAKOTE) 500 MG DR tablet Take 500 mg by mouth daily.    Marland Kitchen esomeprazole (NEXIUM) 40 MG capsule 1 PO 30 MINUTES PRIOR TO MEALS BID 60 capsule 11  . fluticasone (FLONASE) 50 MCG/ACT nasal spray Place 1 spray into both nostrils 2 (two) times daily.    . furosemide (LASIX) 40 MG tablet Take 40 mg by mouth daily.     Marland Kitchen gabapentin (NEURONTIN) 300 MG capsule Take 300 mg by mouth 3 (three) times daily.    Marland Kitchen guaifenesin (ROBITUSSIN) 100 MG/5ML syrup Take 30 mLs by mouth 3 (three) times daily as needed for cough.    . hydrocortisone 2.5 % ointment Apply 1 application topically 2 (two) times daily.    . metoprolol tartrate (LOPRESSOR) 25 MG tablet Take 25 mg by mouth 2 (two) times daily.     . ondansetron (ZOFRAN ODT) 4 MG disintegrating tablet 53m ODT q4 hours prn nausea/vomit 12 tablet 0  . QUEtiapine (SEROQUEL) 400 MG tablet Take 800 mg by mouth at bedtime.    . ranitidine (ZANTAC) 150 MG tablet Take 1 tablet by mouth daily.    .Marland KitchenUNABLE TO FIND CPAP at bedtime     No current facility-administered medications for this visit.      Physical Exam  Blood pressure 139/82, pulse (!) 49, temperature 97.5 F (36.4 C), height 5' 2"  (1.575 m), weight 213 lb (96.6 kg).  Constitutional: overall normal hygiene, normal nutrition, well developed, normal grooming, normal body habitus. Assistive device:none  Musculoskeletal: gait and station Limp none, muscle tone and strength are normal, no tremors or atrophy is present.  .  Neurological: coordination overall normal.  Deep tendon reflex/nerve stretch intact.  Sensation normal.  Cranial nerves II-XII intact.   Skin:   Normal overall no scars, lesions, ulcers  or rashes. No psoriasis.  Psychiatric: Alert and oriented x 3.  Recent memory intact, remote memory unclear.  Normal mood and affect. Well groomed.  Good eye contact.  Cardiovascular: overall no swelling, no varicosities, no edema bilaterally, normal temperatures of the legs and arms, no clubbing, cyanosis and good capillary refill.  Lymphatic: palpation is normal.  Examination of right Upper Extremity is done.  Inspection:   Overall:  Elbow non-tender without crepitus or defects, forearm non-tender without crepitus or defects, wrist non-tender without crepitus or defects, hand non-tender.    Shoulder: with glenohumeral joint tenderness, without effusion.   Upper arm: without swelling and tenderness  Range of motion:   Overall:  Full range of motion of the elbow, full range of motion of wrist and full range of motion in fingers.   Shoulder:  right  165 degrees forward flexion; 140 degrees abduction; 30 degrees internal rotation, 30 degrees external rotation, 20 degrees extension, 40 degrees adduction.   Stability:   Overall:  Shoulder, elbow and wrist stable   Strength and Tone:   Overall full shoulder muscles strength, full upper arm strength and normal upper arm bulk and tone.   The patient has been educated about the nature of the problem(s) and counseled on treatment options.  The patient appeared to understand what I have discussed and is in agreement with it.  Encounter Diagnosis  Name Primary?  . Pain in joint of right shoulder Yes    PLAN Call if any problems.  Precautions discussed.  Continue current medications.   Return to clinic 6 weeks   Electronically Signed Sanjuana Kava, MD 12/14/201710:36 AM

## 2016-10-21 ENCOUNTER — Other Ambulatory Visit: Payer: Self-pay | Admitting: Family Medicine

## 2016-10-21 DIAGNOSIS — Z1231 Encounter for screening mammogram for malignant neoplasm of breast: Secondary | ICD-10-CM

## 2016-10-25 DIAGNOSIS — I5042 Chronic combined systolic (congestive) and diastolic (congestive) heart failure: Secondary | ICD-10-CM | POA: Insufficient documentation

## 2016-10-25 DIAGNOSIS — E119 Type 2 diabetes mellitus without complications: Secondary | ICD-10-CM | POA: Insufficient documentation

## 2016-10-26 DIAGNOSIS — K5010 Crohn's disease of large intestine without complications: Secondary | ICD-10-CM | POA: Insufficient documentation

## 2016-10-26 DIAGNOSIS — G44229 Chronic tension-type headache, not intractable: Secondary | ICD-10-CM | POA: Insufficient documentation

## 2016-10-26 DIAGNOSIS — Z9109 Other allergy status, other than to drugs and biological substances: Secondary | ICD-10-CM | POA: Insufficient documentation

## 2016-10-26 DIAGNOSIS — G40909 Epilepsy, unspecified, not intractable, without status epilepticus: Secondary | ICD-10-CM | POA: Insufficient documentation

## 2016-11-09 ENCOUNTER — Ambulatory Visit
Admission: RE | Admit: 2016-11-09 | Discharge: 2016-11-09 | Disposition: A | Discharge status: Home / Self Care | Payer: Managed Care, Other (non HMO) | Source: Ambulatory Visit | Attending: Family Medicine | Admitting: Family Medicine

## 2016-11-09 DIAGNOSIS — Z1231 Encounter for screening mammogram for malignant neoplasm of breast: Secondary | ICD-10-CM

## 2016-11-11 ENCOUNTER — Ambulatory Visit (INDEPENDENT_AMBULATORY_CARE_PROVIDER_SITE_OTHER): Payer: Managed Care, Other (non HMO) | Admitting: Orthopaedic Surgery

## 2016-11-11 ENCOUNTER — Ambulatory Visit (INDEPENDENT_AMBULATORY_CARE_PROVIDER_SITE_OTHER): Payer: Managed Care, Other (non HMO) | Admitting: Gastroenterology

## 2016-11-11 ENCOUNTER — Encounter: Payer: Self-pay | Admitting: Gastroenterology

## 2016-11-11 VITALS — BP 119/74 | HR 79 | Temp 97.7°F | Ht 62.0 in | Wt 215.0 lb

## 2016-11-11 DIAGNOSIS — K5901 Slow transit constipation: Secondary | ICD-10-CM

## 2016-11-11 DIAGNOSIS — G8929 Other chronic pain: Secondary | ICD-10-CM

## 2016-11-11 DIAGNOSIS — M25511 Pain in right shoulder: Secondary | ICD-10-CM | POA: Diagnosis not present

## 2016-11-11 DIAGNOSIS — K219 Gastro-esophageal reflux disease without esophagitis: Secondary | ICD-10-CM

## 2016-11-11 DIAGNOSIS — K625 Hemorrhage of anus and rectum: Secondary | ICD-10-CM | POA: Diagnosis not present

## 2016-11-11 LAB — CBC WITH DIFFERENTIAL/PLATELET
BASOS ABS: 0 {cells}/uL (ref 0–200)
Basophils Relative: 0 %
EOS ABS: 140 {cells}/uL (ref 15–500)
EOS PCT: 2 %
HCT: 42.4 % (ref 35.0–45.0)
HEMOGLOBIN: 13.8 g/dL (ref 11.7–15.5)
LYMPHS ABS: 2450 {cells}/uL (ref 850–3900)
Lymphocytes Relative: 35 %
MCH: 28.9 pg (ref 27.0–33.0)
MCHC: 32.5 g/dL (ref 32.0–36.0)
MCV: 88.7 fL (ref 80.0–100.0)
MPV: 11.4 fL (ref 7.5–12.5)
Monocytes Absolute: 420 cells/uL (ref 200–950)
Monocytes Relative: 6 %
NEUTROS PCT: 57 %
Neutro Abs: 3990 cells/uL (ref 1500–7800)
Platelets: 252 10*3/uL (ref 140–400)
RBC: 4.78 MIL/uL (ref 3.80–5.10)
RDW: 14 % (ref 11.0–15.0)
WBC: 7 10*3/uL (ref 3.8–10.8)

## 2016-11-11 MED ORDER — HYDROCORTISONE 2.5 % EX OINT: TOPICAL_OINTMENT | CUTANEOUS | 1 refills | Status: DC

## 2016-11-11 NOTE — Patient Instructions (Signed)
COMPLETE LABS.  CONTINUE YOUR WEIGHT LOSS EFFORTS.  DRINK WATER TO KEEP YOUR URINE LIGHT YELLOW.  FOLLOW A HIGH FIBER DIET. AVOID ITEMS THAT CAUSE BLOATING & GAS.   USE ANUSOL CREAM FOUR TIMES DAILY FOR 12 DAYS. PLEASE CALL IN 4-6 WEEKS IF SYMPTOMS ARE NOT IMPROVED.  FOLLOW UP IN 4 MOS.   High-Fiber Diet A high-fiber diet changes your normal diet to include more whole grains, legumes, fruits, and vegetables. Changes in the diet involve replacing refined carbohydrates with unrefined foods. The calorie level of the diet is essentially unchanged. The Dietary Reference Intake (recommended amount) for adult males is 38 grams per day. For adult females, it is 25 grams per day. Pregnant and lactating women should consume 28 grams of fiber per day. Fiber is the intact part of a plant that is not broken down during digestion. Functional fiber is fiber that has been isolated from the plant to provide a beneficial effect in the body.  PURPOSE  Increase stool bulk.   Ease and regulate bowel movements.   Lower cholesterol.   REDUCE RISK OF COLON CANCER  INDICATIONS THAT YOU NEED MORE FIBER  Constipation and hemorrhoids.   Uncomplicated diverticulosis (intestine condition) and irritable bowel syndrome.   Weight management.   As a protective measure against hardening of the arteries (atherosclerosis), diabetes, and cancer.   GUIDELINES FOR INCREASING FIBER IN THE DIET  Start adding fiber to the diet slowly. A gradual increase of about 5 more grams (2 slices of whole-wheat bread, 2 servings of most fruits or vegetables, or 1 bowl of high-fiber cereal) per day is best. Too rapid an increase in fiber may result in constipation, flatulence, and bloating.   Drink enough water and fluids to keep your urine clear or pale yellow. Water, juice, or caffeine-free drinks are recommended. Not drinking enough fluid may cause constipation.   Eat a variety of high-fiber foods rather than one type of fiber.    Try to increase your intake of fiber through using high-fiber foods rather than fiber pills or supplements that contain small amounts of fiber.   The goal is to change the types of food eaten. Do not supplement your present diet with high-fiber foods, but replace foods in your present diet.   INCLUDE A VARIETY OF FIBER SOURCES  Replace refined and processed grains with whole grains, canned fruits with fresh fruits, and incorporate other fiber sources. White rice, white breads, and most bakery goods contain little or no fiber.   Brown whole-grain rice, buckwheat oats, and many fruits and vegetables are all good sources of fiber. These include: broccoli, Brussels sprouts, cabbage, cauliflower, beets, sweet potatoes, white potatoes (skin on), carrots, tomatoes, eggplant, squash, berries, fresh fruits, and dried fruits.   Cereals appear to be the richest source of fiber. Cereal fiber is found in whole grains and bran. Bran is the fiber-rich outer coat of cereal grain, which is largely removed in refining. In whole-grain cereals, the bran remains. In breakfast cereals, the largest amount of fiber is found in those with "bran" in their names. The fiber content is sometimes indicated on the label.   You may need to include additional fruits and vegetables each day.   In baking, for 1 cup white flour, you may use the following substitutions:   1 cup whole-wheat flour minus 2 tablespoons.   1/2 cup white flour plus 1/2 cup whole-wheat flour.

## 2016-11-11 NOTE — Progress Notes (Signed)
CC'ED TO PCP 

## 2016-11-11 NOTE — Progress Notes (Signed)
Subjective:    Patient ID: Kathryn Olson, female    DOB: [birth year], 56 y.o.   MRN: [id number]  [name], DO   HPI Having blood in stool for 3 weeks. SINCE ONSET IT'S THE SAME. IF HAS A REGULAR BM (#4) AND THEN GO BACK THAT EVENING(#7)-NO BLOOD UNLESS SHE HAS A FORMED STOOL. HAVEN'T TRIED MEDS TO MAKE IT BETTER. HAD FLU SHOT JAN 2018 THEN GOT PNA. DOING A LOT OF COUGHING AND IT'S GETTING BETTER. ABOUT TO FREEZE. HEAT IN ROOM IS ON 12F. NO RECTAL PRESSURE, PAIN, ITCHING, BURNING, OR SOILING. HAS SOME MILD LUQ ABDOMINAL PAIN. HAS A PROCEDURE FOR SWALLOWING DUE TO PROBLEM IN HER NECK.  PT DENIES FEVER, CHILLS,  HEMATEMESIS, nausea, vomiting, melena, CHEST PAIN, SHORTNESS OF BREATH, CHANGE IN BOWEL IN HABITS, constipation, problems swallowing, OR heartburn or indigestion.  Past Medical History:  Diagnosis Date  . Anxiety   . CHF (congestive heart failure) ([place])   . Chronic abdominal pain   . Chronic headaches   . Complication of anesthesia   . Crohn's disease [facility]) June 2013  . Environmental allergies   . Family history of anesthesia complication    MH during C Section  . H/O colonoscopy   . History of electroencephalogram 03/2014   normal  . IBS (irritable bowel syndrome) June 2014  . 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 Migraine   . Obsessive-compulsive disorder   . PTSD (post-traumatic stress disorder)   . Seizures ([place])    Past Surgical History:  Procedure Laterality Date  . BLADDER SURGERY     age 71  . CESAREAN SECTION     X2  . COLONOSCOPY  05/22/2012   SLF: Polyps, multiple hyperplastic in the sigmoid colon/Polyp in the rectum/  MODERATE Diverticulosis throughout the colon/ Internal hemorrhoids  . ESOPHAGOGASTRODUODENOSCOPY     remote, had ulcers, [place], Dr. Truddie Coco  . ESOPHAGOGASTRODUODENOSCOPY  05/22/2012   SLF: Stricture in the distal esophagus/Moderate gastritis  . PARTIAL HYSTERECTOMY    . TUBAL LIGATION      Allergies  Allergen Reactions  . Aspirin Anaphylaxis  . Peanut-Containing Drug Products Anaphylaxis       . Penicillins Anaphylaxis      . Sulfa Antibiotics Anaphylaxis  . Codeine Itching  . Adhesive [Tape] Rash  . Latex Rash  . Rubbing Alcohol [Alcohol] Rash   Current Outpatient Prescriptions  Medication Sig Dispense Refill  . amitriptyline (ELAVIL) 50 MG tablet Take 1 tablet by mouth daily.    . divalproex (DEPAKOTE) 500 MG DR tablet Take 500 mg by mouth daily.    Marland Kitchen esomeprazole (NEXIUM) 40 MG capsule 1 PO 30 MINUTES PRIOR TO MEALS BID 60 capsule 11  . fluticasone (FLONASE) 50 MCG/ACT nasal spray Place 1 spray into both nostrils 2 (two) times daily.    . furosemide (LASIX) 40 MG tablet Take 40 mg by mouth daily.     Marland Kitchen gabapentin (NEURONTIN) 300 MG capsule Take 300 mg by mouth 3 (three) times daily.    Marland Kitchen guaifenesin (ROBITUSSIN) 100 MG/5ML syrup Take 30 mLs by mouth 3 (three) times daily as needed for cough.    .      . metoprolol tartrate (LOPRESSOR) 25 MG tablet Take 25 mg by mouth 2 (two) times daily.     . ondansetron (ZOFRAN ODT) 4 MG disintegrating tablet 80m ODT q4 hours prn nausea/vomit 12 tablet 0  . QUEtiapine (SEROQUEL) 400 MG tablet Take 800  mg by mouth at bedtime.    . ranitidine (ZANTAC) 150 MG tablet Take 1 tablet by mouth daily.    Marland Kitchen UNABLE TO FIND CPAP at bedtime     Review of Systems PER HPI OTHERWISE ALL SYSTEMS ARE NEGATIVE.    Objective:   Physical Exam  Constitutional: She is oriented to person, place, and time. She appears well-developed and well-nourished. No distress.  HENT:  Head: Normocephalic and atraumatic.  Mouth/Throat: Oropharynx is clear and moist. No oropharyngeal exudate.  Eyes: Pupils are equal, round, and reactive to light. No scleral icterus.  Neck: Normal range of motion. Neck supple.  Cardiovascular: Normal rate, regular rhythm and normal heart sounds.   Pulmonary/Chest: Effort normal and breath sounds normal. No respiratory  distress.  Abdominal: Soft. Bowel sounds are normal. She exhibits no distension. There is tenderness. There is no rebound and no guarding.  MILD LUQ TENDERNESS TO PALPATION   Musculoskeletal: She exhibits no edema.  Lymphadenopathy:    She has no cervical adenopathy.  Neurological: She is alert and oriented to person, place, and time.  NO  NEW FOCAL DEFICITS  Psychiatric: She has a normal mood and affect.  Vitals reviewed.     Assessment & Plan:

## 2016-11-11 NOTE — Addendum Note (Signed)
Addended by: Danie Binder on: 11/11/2016 11:46 AM   Modules accepted: Orders

## 2016-11-11 NOTE — Progress Notes (Signed)
ON RECALL  °

## 2016-11-11 NOTE — Assessment & Plan Note (Signed)
SYMPTOMS CONTROLLED/RESOLVED.  CONTINUE TO MONITOR SYMPTOMS. 

## 2016-11-11 NOTE — Assessment & Plan Note (Signed)
SYMPTOMS FAIRLY WELL CONTROLLED.  CONTINUE TO MONITOR SYMPTOMS. 

## 2016-11-11 NOTE — Progress Notes (Signed)
PROCEDURE NOTE:  The patient request injection, verbal consent was obtained.  The right shoulder was prepped appropriately after time out was performed.   Sterile technique was observed and injection of 1 cc of Depo-Medrol 40 mg with several cc's of plain xylocaine. Anesthesia was provided by ethyl chloride and a 20-gauge needle was used to inject the shoulder area. A posterior approach was used.  The injection was tolerated well.  A band aid dressing was applied.  The patient was advised to apply ice later today and tomorrow to the injection sight as needed.  I will see her in one month.  Encounter Diagnosis  Name Primary?  . Chronic right shoulder pain Yes   Electronically Signed Sanjuana Kava, MD 1/25/20181:53 PM

## 2016-11-11 NOTE — Assessment & Plan Note (Addendum)
MOST LIKELY DUE TO INTERNAL HEMORRHOIDS. SYMPTOMS NOT CONTROLLED AND EXACERBATED BY COUGHING.  COMPLETE CBC. CONTINUE YOUR WEIGHT LOSS EFFORTS. DRINK WATER TO KEEP YOUR URINE LIGHT YELLOW. FOLLOW A HIGH FIBER DIET. AVOID ITEMS THAT CAUSE BLOATING & GAS.  HANDOUT GIVEN. USE ANUSOL CREAM FOUR TIMES DAILY FOR 12 DAYS. PLEASE CALL IN 4-6 WEEKS IF SYMPTOMS ARE NOT IMPROVED. IF NOT PT WILL NEED FLEX SIG/HEMORRHOID BANDING. DISCUSSED PROCEDURE, BENEFITS, & RISKS: < 1% chance of medication reaction, bleeding, perforation, or PELVIC VEIN SEPSIS.  FOLLOW UP IN 4 MOS.

## 2016-12-02 ENCOUNTER — Telehealth: Payer: Self-pay

## 2016-12-02 NOTE — Telephone Encounter (Signed)
SENT NOTES TO SCHEDULING 

## 2016-12-08 ENCOUNTER — Ambulatory Visit (INDEPENDENT_AMBULATORY_CARE_PROVIDER_SITE_OTHER): Payer: Managed Care, Other (non HMO) | Admitting: Cardiology

## 2016-12-08 VITALS — BP 136/90 | HR 58 | Ht 62.0 in | Wt 216.6 lb

## 2016-12-08 DIAGNOSIS — R079 Chest pain, unspecified: Secondary | ICD-10-CM

## 2016-12-08 DIAGNOSIS — R002 Palpitations: Secondary | ICD-10-CM | POA: Diagnosis not present

## 2016-12-08 NOTE — Progress Notes (Signed)
Electrophysiology Office Note   Date:  12/08/2016   ID:  Kathryn Olson, DOB [birth year], MRN [id number]  PCP:  Emelda Fear DO  Primary Electrophysiologist:  Constance Haw, MD    Chief Complaint  Patient presents with  . New Patient (Initial Visit)    chest pain/eval for CHF     History of Present Illness: Kathryn Olson is a 56 y.o. female who presents today for electrophysiology evaluation.   She was admitted to the hospital in [place] in August with apparently an anaphylactic reaction. At that time she had an echocardiogram and told that she was in congestive heart failure. Prior to that, she says that she was having chest pain. She says that there is nothing that exacerbates the chest pain, but she does say that she is trying to walk to get the chest pain to go away which makes it worse. It lasts for minutes at a time. There is an electrical-type pain across her chest with tingling down into her left arm. She also says that she has been having palpitations. Her palpitations occur on a daily basis mainly when she lies flat, but also occurs when she is up and moving around. She gets short of breath and diaphoretic both with the palpitations and with chest pain.    Past Medical History:  Diagnosis Date  . Anxiety   . CHF (congestive heart failure) ([place])   . Chronic abdominal pain   . Chronic headaches   . Complication of anesthesia   . Crohn's disease [facility]) June 2013  . Environmental allergies   . Family history of anesthesia complication    MH during C Section  . H/O colonoscopy   . History of electroencephalogram 03/2014   normal  . IBS (irritable bowel syndrome) June 2014  . 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 Migraine   . Obsessive-compulsive disorder   . PTSD (post-traumatic stress disorder)   . Seizures ([place])    Past Surgical History:  Procedure Laterality Date  . BLADDER SURGERY     age 67  . CESAREAN SECTION     X2    . COLONOSCOPY  05/22/2012   SLF: Polyps, multiple hyperplastic in the sigmoid colon/Polyp in the rectum/  MODERATE Diverticulosis throughout the colon/ Internal hemorrhoids  . ESOPHAGOGASTRODUODENOSCOPY     remote, had ulcers, [place], Dr. Truddie Coco  . ESOPHAGOGASTRODUODENOSCOPY  05/22/2012   SLF: Stricture in the distal esophagus/Moderate gastritis  . PARTIAL HYSTERECTOMY    . TUBAL LIGATION       Current Outpatient Prescriptions  Medication Sig Dispense Refill  . amitriptyline (ELAVIL) 50 MG tablet Take 1 tablet by mouth daily.    . divalproex (DEPAKOTE) 500 MG DR tablet Take 500 mg by mouth daily.    Marland Kitchen esomeprazole (NEXIUM) 40 MG capsule 1 PO 30 MINUTES PRIOR TO MEALS BID 60 capsule 11  . fluticasone (FLONASE) 50 MCG/ACT nasal spray Place 1 spray into both nostrils 2 (two) times daily.    . furosemide (LASIX) 40 MG tablet Take 40 mg by mouth daily.     Marland Kitchen gabapentin (NEURONTIN) 300 MG capsule Take 300 mg by mouth 3 (three) times daily.    Marland Kitchen guaifenesin (ROBITUSSIN) 100 MG/5ML syrup Take 30 mLs by mouth 3 (three) times daily as needed for cough.    . hydrocortisone 2.5 % ointment 1 application PR QID FOR 12 DAYS 30 g 1  . linaclotide (LINZESS) 290 MCG CAPS capsule  Take 290 mcg by mouth daily.    . metoprolol tartrate (LOPRESSOR) 25 MG tablet Take 25 mg by mouth 2 (two) times daily.     . ondansetron (ZOFRAN ODT) 4 MG disintegrating tablet 28m ODT q4 hours prn nausea/vomit 12 tablet 0  . QUEtiapine (SEROQUEL) 400 MG tablet Take 800 mg by mouth at bedtime.    . ranitidine (ZANTAC) 150 MG tablet Take 1 tablet by mouth daily.    .Marland KitchenUNABLE TO FIND CPAP at bedtime    . topiramate (TOPAMAX) 100 MG tablet Take 100 mg by mouth 2 (two) times daily.    . traMADol (ULTRAM) 50 MG tablet Take 50 mg by mouth daily as needed.    . valproic acid (DEPAKENE) 250 MG capsule Take 250 mg by mouth 4 (four) times daily.     No current facility-administered medications for this visit.     Allergies:    Aspirin; Peanut-containing drug products; Penicillins; Sulfa antibiotics; Codeine; Adhesive [tape]; Latex; and Rubbing alcohol [alcohol]   Social History:  The patient  reports that she quit smoking about 27 years ago. She has never used smokeless tobacco. She reports that she does not drink alcohol or use drugs.   Family History:  The patient's family history includes Alcohol abuse in her father; Anxiety disorder in her sister and sister; Bipolar disorder in her sister; Colon cancer in her maternal grandfather; Dementia in her mother; Depression in her mother and sister; Diabetes in her mother; Heart attack in her father; Heart murmur in her son; Hypertension in her mother; Liver disease in her brother; Malignant hyperthermia in her daughter; OCD in her sister.    ROS:  Please see the history of present illness.   Otherwise, review of systems is positive for Weight change, sweats, fatigue, appetite change, chills, chest pain, leg swelling, chest pressure, leg pain, shortness of breath when lying down, waking up short of breath at night, skipped heartbeats, irregular heartbeats, facial swelling, visual disturbance, cough, shortness of breath with activity, snoring, wheezing, diarrhea, constipation, nausea, depression, anxiety, back pain, muscle pain, balance problems, headaches, rash, dizziness, easy bruising.   All other systems are reviewed and negative.    PHYSICAL EXAM: VS:  BP 136/90   Pulse (!) 58   Ht 5' 2"  (1.575 m)   Wt 216 lb 9.6 oz (98.2 kg)   BMI 39.62 kg/m  , BMI Body mass index is 39.62 kg/m. GEN: Well nourished, well developed, in no acute distress  HEENT: normal  Neck: no JVD, carotid bruits, or masses Cardiac: RRR; no murmurs, rubs, or gallops,no edema  Respiratory:  clear to auscultation bilaterally, normal work of breathing GI: soft, nontender, nondistended, + BS MS: no deformity or atrophy  Skin: warm and dry Neuro:  Strength and sensation are intact Psych: euthymic  mood, full affect  EKG:  EKG is ordered today. Personal review of the ekg ordered shows that his rhythm, rate 58  Recent Labs: 09/19/2016: BUN 13; Creatinine, Ser 0.80; Potassium 4.0; Sodium 140 11/11/2016: Hemoglobin 13.8; Platelets 252    Lipid Panel     Component Value Date/Time   CHOL 203 (H) 09/04/2013 1728   TRIG 185 (H) 09/04/2013 1728   HDL 59 09/04/2013 1728   LDLCALC 107 (H) 09/04/2013 1728     Wt Readings from Last 3 Encounters:  12/08/16 216 lb 9.6 oz (98.2 kg)  11/11/16 215 lb (97.5 kg)  11/11/16 215 lb 3.2 oz (97.6 kg)      Other studies Reviewed:  Additional studies/ records that were reviewed today include: PCP notes   ASSESSMENT AND PLAN:  1.  Hypertension: Well-controlled today. No medication changes at this time.  2. Chest pain: It is unclear as to the cause of her chest pain. She does have both typical and atypical features. Due to the character of her chest pain, and her anxiety from it, we'll order a Lexiscan Myoview to further determine if she has evidence of coronary disease.  3. Palpitations: At this time, it is unclear as to the cause of her palpitations. They occur almost daily. We Maryelizabeth Eberle order a 48 hour monitor to further determine the cause for palpitations.  4. Reported congestive heart failure: She is currently not volume overloaded on exam. She is on metoprolol but no other medical therapy. We'll work to obtain the records from her hospitalization in [place].  Current medicines are reviewed at length with the patient today.   The patient does not have concerns regarding her medicines.  The following changes were made today:  none  Labs/ tests ordered today include:  Orders Placed This Encounter  Procedures  . Myocardial Perfusion Imaging  . Holter monitor - 48 hour  . EKG 12-Lead     Disposition:   FU with Toriann Spadoni 1 month  Signed, Cephas Revard Meredith Leeds, MD  12/08/2016 3:19 PM     [address] [place] [address] [phone] (office) [phone] (fax)

## 2016-12-08 NOTE — Patient Instructions (Addendum)
Medication Instructions:  Your physician recommends that you continue on your current medications as directed. Please refer to the Current Medication list given to you today.   Labwork: None Ordered   Testing/Procedures: Your physician has recommended that you wear a 48 hour holter monitor. Holter monitors are medical devices that record the heart's electrical activity. Doctors most often use these monitors to diagnose arrhythmias. Arrhythmias are problems with the speed or rhythm of the heartbeat. The monitor is a small, portable device. You can wear one while you do your normal daily activities. This is usually used to diagnose what is causing palpitations/syncope (passing out).    Your physician has requested that you have a lexiscan myoview.    Follow-Up: Your physician recommends that you schedule a follow-up appointment in: 1 month with Dr. Curt Bears   Any Other Special Instructions Will Be Listed Below (If Applicable).     If you need a refill on your cardiac medications before your next appointment, please call your pharmacy.

## 2016-12-14 ENCOUNTER — Encounter: Payer: Self-pay | Admitting: Cardiology

## 2016-12-14 ENCOUNTER — Ambulatory Visit (INDEPENDENT_AMBULATORY_CARE_PROVIDER_SITE_OTHER): Payer: Managed Care, Other (non HMO) | Admitting: Orthopaedic Surgery

## 2016-12-14 VITALS — BP 136/91 | HR 75 | Temp 97.2°F | Ht 62.0 in | Wt 214.0 lb

## 2016-12-14 DIAGNOSIS — M25511 Pain in right shoulder: Secondary | ICD-10-CM | POA: Diagnosis not present

## 2016-12-14 DIAGNOSIS — G8929 Other chronic pain: Secondary | ICD-10-CM

## 2016-12-14 NOTE — Progress Notes (Signed)
Patient Kathryn Olson, female DOB:[birth year], 56 y.o. OVZ:[id number]  Chief Complaint  Patient presents with  . Follow-up    Chronic pain of Right Shoulder    HPI  Kathryn Olson is a 56 y.o. female who has pain in the right shoulder.  She is stable.  The injection last time helped.  She has no numbness, no swelling, no redness.  She is getting pain medicine for her back from another doctor. HPI  Body mass index is 39.14 kg/m.  ROS  Review of Systems  HENT: Negative for congestion.   Respiratory: Positive for chest tightness and shortness of breath. Negative for cough.   Cardiovascular: Positive for palpitations. Negative for chest pain and leg swelling.  Endocrine: Positive for cold intolerance.  Musculoskeletal: Positive for arthralgias, joint swelling and neck pain.  Allergic/Immunologic: Positive for environmental allergies.  Psychiatric/Behavioral: Agitation: .wkov. The patient is nervous/anxious.     Past Medical History:  Diagnosis Date  . Anxiety   . CHF (congestive heart failure) ([place])   . Chronic abdominal pain   . Chronic headaches   . Complication of anesthesia   . Crohn's disease [facility]) June 2013  . Environmental allergies   . Family history of anesthesia complication    MH during C Section  . H/O colonoscopy   . History of electroencephalogram 03/2014   normal  . IBS (irritable bowel syndrome) June 2014  . 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 Migraine   . Obsessive-compulsive disorder   . PTSD (post-traumatic stress disorder)   . Seizures ([place])     Past Surgical History:  Procedure Laterality Date  . BLADDER SURGERY     age 36  . CESAREAN SECTION     X2  . COLONOSCOPY  05/22/2012   SLF: Polyps, multiple hyperplastic in the sigmoid colon/Polyp in the rectum/  MODERATE Diverticulosis throughout the colon/ Internal hemorrhoids  . ESOPHAGOGASTRODUODENOSCOPY     remote, had ulcers, [place], Dr. Truddie Coco  .  ESOPHAGOGASTRODUODENOSCOPY  05/22/2012   SLF: Stricture in the distal esophagus/Moderate gastritis  . PARTIAL HYSTERECTOMY    . TUBAL LIGATION      Family History  Problem Relation Age of Onset  . Colon cancer Maternal Grandfather     ?less than age 63s  . Heart attack Father   . Alcohol abuse Father   . Diabetes Mother   . Hypertension Mother   . Dementia Mother   . Depression Mother   . Liver disease Brother     does know details, but related to MVA?  Marland Kitchen Anxiety disorder Sister   . Bipolar disorder Sister   . OCD Sister   . Anxiety disorder Sister   . Depression Sister   . Malignant hyperthermia Daughter   . Heart murmur Son   . Inflammatory bowel disease Neg Hx   . ADD / ADHD Neg Hx   . Drug abuse Neg Hx   . Paranoid behavior Neg Hx   . Schizophrenia Neg Hx   . Seizures Neg Hx   . Sexual abuse Neg Hx   . Physical abuse Neg Hx   . Colon polyps Neg Hx     Social History Social History  Substance Use Topics  . Smoking status: Former Smoker    Quit date: 01/01/1989  . Smokeless tobacco: Never Used     Comment: Quit a few years ago  . Alcohol use No     Comment: socially  Allergies  Allergen Reactions  . Aspirin Anaphylaxis  . Peanut-Containing Drug Products Anaphylaxis       . Penicillins Anaphylaxis    Stops her heart. Has patient had a PCN reaction causing immediate rash, facial/tongue/throat swelling, SOB or lightheadedness with hypotension: yes Has patient had a PCN reaction causing severe rash involving mucus membranes or skin necrosis: no Has patient had a PCN reaction that required hospitalization: yes Has patient had a PCN reaction occurring within the last 10 years: yes If all of the above answers are "NO", then may proceed with Cephalosporin use.   . Sulfa Antibiotics Anaphylaxis  . Codeine Itching  . Adhesive [Tape] Rash  . Latex Rash  . Rubbing Alcohol [Alcohol] Rash    Current Outpatient Prescriptions  Medication Sig Dispense Refill  .  albuterol (PROVENTIL HFA;VENTOLIN HFA) 108 (90 Base) MCG/ACT inhaler Inhale 2 puffs into the lungs every 6 (six) hours as needed for wheezing or shortness of breath.    Marland Kitchen albuterol (PROVENTIL) (5 MG/ML) 0.5% nebulizer solution Take 2.5 mg by nebulization every 6 (six) hours as needed for wheezing or shortness of breath.    . ALPRAZolam (XANAX) 0.5 MG tablet Take 0.5 mg by mouth 3 (three) times daily as needed for anxiety.    Marland Kitchen amitriptyline (ELAVIL) 50 MG tablet Take 1 tablet by mouth daily.    . Bepotastine Besilate 1.5 % SOLN Place 1 drop into both eyes 2 (two) times daily.    . busPIRone (BUSPAR) 15 MG tablet Take 15 mg by mouth 4 (four) times daily.    Marland Kitchen dexlansoprazole (DEXILANT) 60 MG capsule Take 60 mg by mouth daily.    . diclofenac sodium (VOLTAREN) 1 % GEL Apply 2 g topically 2 (two) times daily. Shoulder pain    . diphenhydrAMINE (BENADRYL) 25 MG tablet Take 25 mg by mouth every 6 (six) hours as needed for itching or allergies.    Marland Kitchen divalproex (DEPAKOTE) 500 MG DR tablet Take 500 mg by mouth daily.    . DULoxetine (CYMBALTA) 60 MG capsule Take 60 mg by mouth daily.    Marland Kitchen EPINEPHrine 0.3 mg/0.3 mL IJ SOAJ injection Inject 0.3 mg into the muscle as needed.    Marland Kitchen esomeprazole (NEXIUM) 40 MG capsule 1 PO 30 MINUTES PRIOR TO MEALS BID 60 capsule 11  . famotidine (PEPCID) 20 MG tablet Take 20 mg by mouth daily.    . fluticasone (FLONASE) 50 MCG/ACT nasal spray Place 1 spray into both nostrils 2 (two) times daily.    . furosemide (LASIX) 40 MG tablet Take 40 mg by mouth daily.     Marland Kitchen gabapentin (NEURONTIN) 300 MG capsule Take 300 mg by mouth 3 (three) times daily.    Marland Kitchen guaifenesin (ROBITUSSIN) 100 MG/5ML syrup Take 30 mLs by mouth 3 (three) times daily as needed for cough.    Marland Kitchen guaiFENesin-codeine (ROBITUSSIN AC) 100-10 MG/5ML syrup Take 5 mLs by mouth 4 (four) times daily as needed for cough.    Marland Kitchen HYDROcodone-acetaminophen (NORCO) 10-325 MG tablet Take 1 tablet by mouth every 6 (six) hours as  needed for moderate pain or severe pain.    . hydrocortisone 2.5 % ointment 1 application PR QID FOR 12 DAYS 30 g 1  . hydrOXYzine (VISTARIL) 25 MG capsule Take 25 mg by mouth daily as needed for itching.    . levocetirizine (XYZAL) 5 MG tablet Take 5 mg by mouth every evening.    . linaclotide (LINZESS) 290 MCG CAPS capsule Take 290 mcg by  mouth daily.    . metFORMIN (GLUCOPHAGE) 500 MG tablet Take 500 mg by mouth 2 (two) times daily with a meal.    . metoprolol tartrate (LOPRESSOR) 25 MG tablet Take 25 mg by mouth 2 (two) times daily.     . nicotine (NICODERM CQ - DOSED IN MG/24 HOURS) 21 mg/24hr patch Place 21 mg onto the skin daily.    . Olopatadine HCl 0.2 % SOLN Apply 2 drops to eye 2 (two) times daily.    . OMALIZUMAB [place] Inject 150 Units into the skin every 14 (fourteen) days.    . ondansetron (ZOFRAN ODT) 4 MG disintegrating tablet 45m ODT q4 hours prn nausea/vomit 12 tablet 0  . phentermine 37.5 MG capsule Take 37.5 mg by mouth every morning.    . prednisoLONE acetate (PRED FORTE) 1 % ophthalmic suspension Place 1 drop into both eyes 4 (four) times daily.    . QUEtiapine (SEROQUEL) 400 MG tablet Take 800 mg by mouth at bedtime.    . ranitidine (ZANTAC) 150 MG tablet Take 1 tablet by mouth daily.    .Marland Kitchentopiramate (TOPAMAX) 100 MG tablet Take 100 mg by mouth 2 (two) times daily.    .Marland KitchenUNABLE TO FIND CPAP at bedtime    . valproic acid (DEPAKENE) 250 MG capsule Take 250 mg by mouth 4 (four) times daily.     No current facility-administered medications for this visit.      Physical Exam  Blood pressure (!) 136/91, pulse 75, temperature 97.2 F (36.2 C), height 5' 2"  (1.575 m), weight 214 lb (97.1 kg).  Constitutional: overall normal hygiene, normal nutrition, well developed, normal grooming, normal body habitus. Assistive device:none  Musculoskeletal: gait and station Limp none, muscle tone and strength are normal, no tremors or atrophy is present.  .  Neurological: coordination  overall normal.  Deep tendon reflex/nerve stretch intact.  Sensation normal.  Cranial nerves II-XII intact.   Skin:   Normal overall no scars, lesions, ulcers or rashes. No psoriasis.  Psychiatric: Alert and oriented x 3.  Recent memory intact, remote memory unclear.  Normal mood and affect. Well groomed.  Good eye contact.  Cardiovascular: overall no swelling, no varicosities, no edema bilaterally, normal temperatures of the legs and arms, no clubbing, cyanosis and good capillary refill.  Lymphatic: palpation is normal.  Examination of right Upper Extremity is done.  Inspection:   Overall:  Elbow non-tender without crepitus or defects, forearm non-tender without crepitus or defects, wrist non-tender without crepitus or defects, hand non-tender.    Shoulder: with glenohumeral joint tenderness, without effusion.   Upper arm: without swelling and tenderness   Range of motion:   Overall:  Full range of motion of the elbow, full range of motion of wrist and full range of motion in fingers.   Shoulder:  right  165 degrees forward flexion; 145 degrees abduction; 35 degrees internal rotation, 35 degrees external rotation, 15 degrees extension, 40 degrees adduction.   Stability:   Overall:  Shoulder, elbow and wrist stable   Strength and Tone:   Overall full shoulder muscles strength, full upper arm strength and normal upper arm bulk and tone.   The patient has been educated about the nature of the problem(s) and counseled on treatment options.  The patient appeared to understand what I have discussed and is in agreement with it.  Encounter Diagnosis  Name Primary?  . Chronic right shoulder pain Yes    PLAN Call if any problems.  Precautions discussed.  Continue current medications.   Return to clinic 6 weeks   Electronically Signed Sanjuana Kava, MD 2/27/20182:10 PM

## 2016-12-16 ENCOUNTER — Encounter: Payer: Self-pay | Admitting: Gastroenterology

## 2016-12-30 ENCOUNTER — Telehealth (HOSPITAL_COMMUNITY): Payer: Self-pay | Admitting: *Deleted

## 2016-12-30 NOTE — Telephone Encounter (Signed)
Patient given detailed instructions per Myocardial Perfusion Study Information Sheet for the test on 12/30/16. Patient notified to arrive 15 minutes early and that it is imperative to arrive on time for appointment to keep from having the test rescheduled.  If you need to cancel or reschedule your appointment, please call the office within 24 hours of your appointment. Failure to do so may result in a cancellation of your appointment, and a $50 no show fee. Patient verbalized understanding.   Kirstie Peri

## 2017-01-04 ENCOUNTER — Ambulatory Visit (HOSPITAL_COMMUNITY): Payer: Managed Care, Other (non HMO) | Attending: Cardiology

## 2017-01-04 ENCOUNTER — Ambulatory Visit (INDEPENDENT_AMBULATORY_CARE_PROVIDER_SITE_OTHER): Payer: Managed Care, Other (non HMO)

## 2017-01-04 ENCOUNTER — Encounter: Payer: Self-pay | Admitting: Cardiology

## 2017-01-04 VITALS — Ht 62.0 in | Wt 216.0 lb

## 2017-01-04 DIAGNOSIS — I2510 Atherosclerotic heart disease of native coronary artery without angina pectoris: Secondary | ICD-10-CM | POA: Diagnosis present

## 2017-01-04 DIAGNOSIS — I110 Hypertensive heart disease with heart failure: Secondary | ICD-10-CM | POA: Diagnosis not present

## 2017-01-04 DIAGNOSIS — R079 Chest pain, unspecified: Secondary | ICD-10-CM

## 2017-01-04 DIAGNOSIS — E119 Type 2 diabetes mellitus without complications: Secondary | ICD-10-CM | POA: Diagnosis not present

## 2017-01-04 DIAGNOSIS — I509 Heart failure, unspecified: Secondary | ICD-10-CM | POA: Diagnosis not present

## 2017-01-04 DIAGNOSIS — R002 Palpitations: Secondary | ICD-10-CM | POA: Diagnosis not present

## 2017-01-04 DIAGNOSIS — R110 Nausea: Secondary | ICD-10-CM

## 2017-01-04 MED ORDER — AMINOPHYLLINE 25 MG/ML IV SOLN
75.0000 mg | Freq: Once | INTRAVENOUS | Status: AC
  Administered 2017-01-04: 75 mg via INTRAVENOUS

## 2017-01-04 MED ORDER — REGADENOSON 0.4 MG/5ML IV SOLN
0.4000 mg | Freq: Once | INTRAVENOUS | Status: AC
  Administered 2017-01-04: 0.4 mg via INTRAVENOUS

## 2017-01-04 MED ORDER — TECHNETIUM TC 99M TETROFOSMIN IV KIT
32.9000 | PACK | Freq: Once | INTRAVENOUS | Status: AC | PRN
  Administered 2017-01-04: 32.9 via INTRAVENOUS
  Filled 2017-01-04: qty 33

## 2017-01-05 ENCOUNTER — Ambulatory Visit (HOSPITAL_COMMUNITY): Payer: Managed Care, Other (non HMO) | Attending: Internal Medicine

## 2017-01-05 LAB — MYOCARDIAL PERFUSION IMAGING
CHL CUP NUCLEAR SSS: 0
LV dias vol: 79 mL (ref 46–106)
LV sys vol: 35 mL
NUC STRESS TID: 0.93
Peak HR: 141 {beats}/min
RATE: 0.28
Rest HR: 77 {beats}/min
SDS: 0
SRS: 0

## 2017-01-05 MED ORDER — TECHNETIUM TC 99M TETROFOSMIN IV KIT
32.6000 | PACK | Freq: Once | INTRAVENOUS | Status: AC | PRN
  Administered 2017-01-05: 32.6 via INTRAVENOUS
  Filled 2017-01-05: qty 33

## 2017-01-13 ENCOUNTER — Ambulatory Visit (INDEPENDENT_AMBULATORY_CARE_PROVIDER_SITE_OTHER): Payer: Managed Care, Other (non HMO) | Admitting: Cardiology

## 2017-01-13 ENCOUNTER — Encounter: Payer: Self-pay | Admitting: Cardiology

## 2017-01-13 VITALS — BP 132/88 | HR 78 | Ht 62.0 in | Wt 219.2 lb

## 2017-01-13 DIAGNOSIS — R002 Palpitations: Secondary | ICD-10-CM

## 2017-01-13 NOTE — Patient Instructions (Signed)
Medication Instructions:  Your physician recommends that you continue on your current medications as directed. Please refer to the Current Medication list given to you today.  If you need a refill on your cardiac medications before your next appointment, please call your pharmacy.   Labwork: None ordered  Testing/Procedures: None ordered  Follow-Up: Your physician wants you to follow-up in: 6 months with Dr. Camnitz.  You will receive a reminder letter in the mail two months in advance. If you don't receive a letter, please call our office to schedule the follow-up appointment.  Thank you for choosing [facility]!!   Yoshi Mancillas, RN [phone]         

## 2017-01-13 NOTE — Progress Notes (Signed)
Electrophysiology Office Note   Date:  01/13/2017   ID:  Kathryn Olson, DOB October 10, 1961, MRN [id number]  PCP:  Emelda Fear DO  Primary Electrophysiologist:  Constance Haw, MD    No chief complaint on file.    History of Present Illness: Kathryn Olson is a 56 y.o. female who presents today for electrophysiology evaluation.   She was admitted to the hospital in [place] in August with apparently an anaphylactic reaction. At that time she had an echocardiogram and told that she was in congestive heart failure. Prior to that, she says that she was having chest pain.  He had a nuclear stress test performed which showed no evidence of ischemia. She was also having palpitations with a 30 day monitor that showed sinus rhythm and palpitations and shortness of breath associated with sinus rhythm.  Today she is feeling much improved. Her chest pain is improved and she is having muscle less episodes of palpitations.    Past Medical History:  Diagnosis Date  . Anxiety   . CHF (congestive heart failure) ([place])   . Chronic abdominal pain   . Chronic headaches   . Complication of anesthesia   . Crohn's disease [facility]) June 2013  . Environmental allergies   . Family history of anesthesia complication    MH during C Section  . H/O colonoscopy   . History of electroencephalogram 03/2014   normal  . IBS (irritable bowel syndrome) June 2014  . 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 Migraine   . Obsessive-compulsive disorder   . PTSD (post-traumatic stress disorder)   . Seizures ([place])    Past Surgical History:  Procedure Laterality Date  . BLADDER SURGERY     age 38  . CESAREAN SECTION     X2  . COLONOSCOPY  05/22/2012   SLF: Polyps, multiple hyperplastic in the sigmoid colon/Polyp in the rectum/  MODERATE Diverticulosis throughout the colon/ Internal hemorrhoids  . ESOPHAGOGASTRODUODENOSCOPY     remote, had ulcers, [place], Dr. Truddie Coco  .  ESOPHAGOGASTRODUODENOSCOPY  05/22/2012   SLF: Stricture in the distal esophagus/Moderate gastritis  . PARTIAL HYSTERECTOMY    . TUBAL LIGATION       Current Outpatient Prescriptions  Medication Sig Dispense Refill  . albuterol (PROVENTIL HFA;VENTOLIN HFA) 108 (90 Base) MCG/ACT inhaler Inhale 2 puffs into the lungs every 6 (six) hours as needed for wheezing or shortness of breath.    Marland Kitchen albuterol (PROVENTIL) (5 MG/ML) 0.5% nebulizer solution Take 2.5 mg by nebulization every 6 (six) hours as needed for wheezing or shortness of breath.    . ALPRAZolam (XANAX) 0.5 MG tablet Take 0.5 mg by mouth 3 (three) times daily as needed for anxiety.    Marland Kitchen amitriptyline (ELAVIL) 50 MG tablet Take 1 tablet by mouth daily.    . Bepotastine Besilate 1.5 % SOLN Place 1 drop into both eyes 2 (two) times daily.    . busPIRone (BUSPAR) 15 MG tablet Take 15 mg by mouth 4 (four) times daily.    Marland Kitchen dexlansoprazole (DEXILANT) 60 MG capsule Take 60 mg by mouth daily.    . diclofenac sodium (VOLTAREN) 1 % GEL Apply 2 g topically 2 (two) times daily. Shoulder pain    . diphenhydrAMINE (BENADRYL) 25 MG tablet Take 25 mg by mouth every 6 (six) hours as needed for itching or allergies.    Marland Kitchen divalproex (DEPAKOTE) 500 MG DR tablet Take 500 mg by mouth daily.    Marland Kitchen  DULoxetine (CYMBALTA) 60 MG capsule Take 60 mg by mouth daily.    Marland Kitchen EPINEPHrine 0.3 mg/0.3 mL IJ SOAJ injection Inject 0.3 mg into the muscle as needed.    Marland Kitchen esomeprazole (NEXIUM) 40 MG capsule 1 PO 30 MINUTES PRIOR TO MEALS BID 60 capsule 11  . famotidine (PEPCID) 20 MG tablet Take 20 mg by mouth daily.    . fluticasone (FLONASE) 50 MCG/ACT nasal spray Place 1 spray into both nostrils 2 (two) times daily.    . furosemide (LASIX) 40 MG tablet Take 40 mg by mouth daily.     Marland Kitchen gabapentin (NEURONTIN) 300 MG capsule Take 300 mg by mouth 3 (three) times daily.    Marland Kitchen guaifenesin (ROBITUSSIN) 100 MG/5ML syrup Take 30 mLs by mouth 3 (three) times daily as needed for cough.      Marland Kitchen guaiFENesin-codeine (ROBITUSSIN AC) 100-10 MG/5ML syrup Take 5 mLs by mouth 4 (four) times daily as needed for cough.    Marland Kitchen HYDROcodone-acetaminophen (NORCO) 10-325 MG tablet Take 1 tablet by mouth every 6 (six) hours as needed for moderate pain or severe pain.    . hydrocortisone 2.5 % ointment 1 application PR QID FOR 12 DAYS 30 g 1  . hydrOXYzine (VISTARIL) 25 MG capsule Take 25 mg by mouth daily as needed for itching.    . levocetirizine (XYZAL) 5 MG tablet Take 5 mg by mouth every evening.    . linaclotide (LINZESS) 290 MCG CAPS capsule Take 290 mcg by mouth daily.    . metFORMIN (GLUCOPHAGE) 500 MG tablet Take 500 mg by mouth 2 (two) times daily with a meal.    . metoprolol tartrate (LOPRESSOR) 25 MG tablet Take 25 mg by mouth 2 (two) times daily.     . nicotine (NICODERM CQ - DOSED IN MG/24 HOURS) 21 mg/24hr patch Place 21 mg onto the skin daily.    . Olopatadine HCl 0.2 % SOLN Apply 2 drops to eye 2 (two) times daily.    . OMALIZUMAB [place] Inject 150 Units into the skin every 14 (fourteen) days.    . ondansetron (ZOFRAN ODT) 4 MG disintegrating tablet 41m ODT q4 hours prn nausea/vomit 12 tablet 0  . phentermine 37.5 MG capsule Take 37.5 mg by mouth every morning.    . prednisoLONE acetate (PRED FORTE) 1 % ophthalmic suspension Place 1 drop into both eyes 4 (four) times daily.    . QUEtiapine (SEROQUEL) 400 MG tablet Take 800 mg by mouth at bedtime.    . ranitidine (ZANTAC) 150 MG tablet Take 1 tablet by mouth daily.    .Marland Kitchentopiramate (TOPAMAX) 100 MG tablet Take 100 mg by mouth 2 (two) times daily.    .Marland KitchenUNABLE TO FIND CPAP at bedtime    . valproic acid (DEPAKENE) 250 MG capsule Take 250 mg by mouth 4 (four) times daily.     No current facility-administered medications for this visit.     Allergies:   Aspirin; Peanut-containing drug products; Penicillins; Sulfa antibiotics; Codeine; Adhesive [tape]; Latex; and Rubbing alcohol [alcohol]   Social History:  The patient  reports that she  quit smoking about 28 years ago. She has never used smokeless tobacco. She reports that she does not drink alcohol or use drugs.   Family History:  The patient's family history includes Alcohol abuse in her father; Anxiety disorder in her sister and sister; Bipolar disorder in her sister; Colon cancer in her maternal grandfather; Dementia in her mother; Depression in her mother and sister; Diabetes in  her mother; Heart attack in her father; Heart murmur in her son; Hypertension in her mother; Liver disease in her brother; Malignant hyperthermia in her daughter; OCD in her sister.    ROS:  Please see the history of present illness.   Otherwise, review of systems is positive for Sweats, fatigue, chest pain, leg swelling, shortness of breath, palpitations, leg pain, visual disturbance, dyspnea on exertion, snoring, wheezing, abdominal pain, constipation, nausea, depression, anxiety, back pain, muscle pain, balance problems, headache, dizziness, easy bruising.   All other systems are reviewed and negative.    PHYSICAL EXAM: VS:  There were no vitals taken for this visit. , BMI There is no height or weight on file to calculate BMI. GEN: Well nourished, well developed, in no acute distress  HEENT: normal  Neck: no JVD, carotid bruits, or masses Cardiac: RRR; no murmurs, rubs, or gallops,no edema  Respiratory:  clear to auscultation bilaterally, normal work of breathing GI: soft, nontender, nondistended, + BS MS: no deformity or atrophy  Skin: warm and dry Neuro:  Strength and sensation are intact Psych: euthymic mood, full affect  EKG:  EKG is ordered today. Personal review of the ekg ordered 12/08/16 shows that his rhythm, rate 58  Recent Labs: 09/19/2016: BUN 13; Creatinine, Ser 0.80; Potassium 4.0; Sodium 140 11/11/2016: Hemoglobin 13.8; Platelets 252    Lipid Panel     Component Value Date/Time   CHOL 203 (H) 09/04/2013 1728   TRIG 185 (H) 09/04/2013 1728   HDL 59 09/04/2013 1728    LDLCALC 107 (H) 09/04/2013 1728     Wt Readings from Last 3 Encounters:  01/04/17 216 lb (98 kg)  12/14/16 214 lb (97.1 kg)  12/08/16 216 lb 9.6 oz (98.2 kg)      Other studies Reviewed: Additional studies/ records that were reviewed today include: Myoview 01/05/17  Nuclear stress EF: 55%.  There was no ST segment deviation noted during stress.  The study is normal.  The left ventricular ejection fraction is normal (55-65%).   1. EF 55%, normal wall motion.  2. No significant perfusion defect, no evidence for ischemia or infarction.  3. Normal study.    ASSESSMENT AND PLAN:  1.  Hypertension: Well-controlled today. No medication changes at this time.  2. Chest pain: Nuclear stress test without evidence of ischemia. Likely was noncardiac chest pain  3. Palpitations: No obvious arrhythmias seen on monitoring. We'll continue current management.  4. Reported congestive heart failure: Ejection fraction on her nuclear stress test was 55%. Possibility of diastolic dysfunction versus excessive fluid administration around the time of her anaphylactic episode. She is currently well compensated. She is not a cardiac medication that she is taking that would interfere with Seroquel.  Current medicines are reviewed at length with the patient today.   The patient does not have concerns regarding her medicines.  The following changes were made today:  none  Labs/ tests ordered today include:  No orders of the defined types were placed in this encounter.    Disposition:   FU with Zymere Patlan 6 month  Signed, Lizmarie Witters Meredith Leeds, MD  01/13/2017 8:41 AM     [facility] [address] [place] [address] [phone] (office) [phone] (fax)

## 2017-01-25 ENCOUNTER — Encounter: Payer: Self-pay | Admitting: Orthopaedic Surgery

## 2017-01-25 ENCOUNTER — Ambulatory Visit (INDEPENDENT_AMBULATORY_CARE_PROVIDER_SITE_OTHER): Payer: Managed Care, Other (non HMO) | Admitting: Orthopaedic Surgery

## 2017-01-25 VITALS — BP 156/91 | HR 64 | Temp 97.5°F | Ht 62.0 in | Wt 216.0 lb

## 2017-01-25 DIAGNOSIS — G8929 Other chronic pain: Secondary | ICD-10-CM

## 2017-01-25 DIAGNOSIS — M25511 Pain in right shoulder: Secondary | ICD-10-CM | POA: Diagnosis not present

## 2017-01-25 NOTE — Progress Notes (Signed)
PROCEDURE NOTE:  The patient request injection, verbal consent was obtained.  The right shoulder was prepped appropriately after time out was performed.   Sterile technique was observed and injection of 1 cc of Depo-Medrol 40 mg with several cc's of plain xylocaine. Anesthesia was provided by ethyl chloride and a 20-gauge needle was used to inject the shoulder area. A posterior approach was used.  The injection was tolerated well.  A band aid dressing was applied.  The patient was advised to apply ice later today and tomorrow to the injection sight as needed.  Return in one month.  Call if any problem.  Precautions discussed.  Electronically Signed Sanjuana Kava, MD 4/10/201810:47 AM

## 2017-02-02 ENCOUNTER — Ambulatory Visit (INDEPENDENT_AMBULATORY_CARE_PROVIDER_SITE_OTHER): Payer: Managed Care, Other (non HMO) | Admitting: Gastroenterology

## 2017-02-02 ENCOUNTER — Encounter: Payer: Self-pay | Admitting: Gastroenterology

## 2017-02-02 ENCOUNTER — Ambulatory Visit: Payer: Managed Care, Other (non HMO) | Admitting: Gastroenterology

## 2017-02-02 DIAGNOSIS — K219 Gastro-esophageal reflux disease without esophagitis: Secondary | ICD-10-CM | POA: Diagnosis not present

## 2017-02-02 DIAGNOSIS — K625 Hemorrhage of anus and rectum: Secondary | ICD-10-CM

## 2017-02-02 DIAGNOSIS — K581 Irritable bowel syndrome with constipation: Secondary | ICD-10-CM | POA: Diagnosis not present

## 2017-02-02 NOTE — Progress Notes (Signed)
Subjective:    Patient ID: Kathryn Olson, female    DOB: 1961/03/14, 56 y.o.   MRN: [id number]  [facility], DO  HPI Drinks a lot of liquids. GAINED 4 LBS SINCE MAR 2017. MOVES BOWELS PRETTY GOOD(BM1-2X/DAY AND THEN 1X/DAY). TAKING LINZESS RARE PELLETS. C/O PAIN AROUND HER NAVEL(SHARP,ALL THE TIME, KEEPS HER AWAKE, BETTER: WARM TEA, WORSE: IF SHE EATS SOMETIMES, OTHER TIMES IT'S JUST THERE, NO RADIATION) AND LEFT SIDE. STAYS COLD ALL THE TIME. MAY HAVE PROBLEMS SWALLOWING ON RIGHT SIDE/NECKPAIN. [place] IN AUG 2017 AFTER RESPIRATORY/CONGESTIVE HEART FAILURE(10 DAYS IN [facility]). SOB: OK, CHEST PAIN: OCCASIONAL ACROSS CHEST(SHARP/PRESSURE). BURPS A LOT. SWEATS A LOT. LOST OF STRESS RIGHT NOW AND SHE'S NOT DOING GOOD. UNABLE TO HAVE PEACE AND QUIET TO HAVE A SATISFACTORY BM.  PT DENIES FEVER, CHILLS, HEMATOCHEZIA, nausea, vomiting, melena, diarrhea, CHEST PAIN, OR, heartburn or indigestion.  Past Medical History:  Diagnosis Date  . Anxiety   . CHF (congestive heart failure) ([place])   . Chronic abdominal pain   . Chronic headaches   . Complication of anesthesia   . Crohn's disease [facility]) June 2013  . Environmental allergies   . Family history of anesthesia complication    MH during C Section  . H/O colonoscopy   . History of electroencephalogram 03/2014   normal  . IBS (irritable bowel syndrome) June 2014  . 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 Migraine   . Obsessive-compulsive disorder   . PTSD (post-traumatic stress disorder)   . Seizures ([place])    Past Surgical History:  Procedure Laterality Date  . BLADDER SURGERY     age 34  . CESAREAN SECTION     X2  . COLONOSCOPY  05/22/2012   SLF: Polyps, multiple hyperplastic in the sigmoid colon/Polyp in the rectum/  MODERATE Diverticulosis throughout the colon/ Internal hemorrhoids  . ESOPHAGOGASTRODUODENOSCOPY     remote, had ulcers, [place], Dr. Truddie Coco  . ESOPHAGOGASTRODUODENOSCOPY   05/22/2012   SLF: Stricture in the distal esophagus/Moderate gastritis  . PARTIAL HYSTERECTOMY    . TUBAL LIGATION     Allergies  Allergen Reactions  . Aspirin Anaphylaxis  . Peanut-Containing Drug Products Anaphylaxis       . Penicillins Anaphylaxis      . Sulfa Antibiotics Anaphylaxis  . Codeine Itching  . Adhesive [Tape] Rash  . Latex Rash  . Rubbing Alcohol [Alcohol] Rash    Current Outpatient Prescriptions  Medication Sig Dispense Refill  . albuterol (PROVENTIL HFA;VENTOLIN HFA) 108 (90 Base) MCG/ACT inhaler Inhale 2 puffs into the lungs every 6 (six) hours as needed for wheezing or shortness of breath.    Marland Kitchen albuterol (PROVENTIL) (5 MG/ML) 0.5% nebulizer solution Take 2.5 mg by nebulization every 6 (six) hours as needed for wheezing or shortness of breath.    . ALPRAZolam (XANAX) 0.5 MG tablet Take 0.5 mg by mouth 3 (three) times daily as needed for anxiety.    Marland Kitchen amitriptyline (ELAVIL) 50 MG tablet Take 1 tablet by mouth daily.    . Bepotastine Besilate 1.5 % SOLN Place 1 drop into both eyes 2 (two) times daily.    . busPIRone (BUSPAR) 15 MG tablet Take 15 mg by mouth 4 (four) times daily.    . diclofenac sodium (VOLTAREN) 1 % GEL Apply 2 g topically 2 (two) times daily. Shoulder pain    . diphenhydrAMINE (BENADRYL) 25 MG tablet Take 25 mg by mouth every 6 (six) hours  as needed for itching or allergies.    Marland Kitchen divalproex (DEPAKOTE) 500 MG DR tablet Take 500 mg by mouth daily.    . DULoxetine (CYMBALTA) 60 MG capsule Take 60 mg by mouth daily.    Marland Kitchen EPINEPHrine 0.3 mg/0.3 mL IJ SOAJ injection Inject 0.3 mg into the muscle as needed.    Marland Kitchen esomeprazole (NEXIUM) 40 MG capsule 1 PO 30 MINUTES PRIOR TO MEALS BID    . famotidine (PEPCID) 20 MG tablet Take 20 mg by mouth daily.    . fluticasone (FLONASE) 50 MCG/ACT nasal spray Place 1 spray into both nostrils 2 (two) times daily.    . furosemide (LASIX) 40 MG tablet Take 40 mg by mouth daily.     Marland Kitchen gabapentin (NEURONTIN) 300 MG capsule  Take 300 mg by mouth 3 (three) times daily.    Marland Kitchen guaifenesin (ROBITUSSIN) 100 MG/5ML syrup Take 30 mLs by mouth 3 (three) times daily as needed for cough.    Marland Kitchen guaiFENesin-codeine (ROBITUSSIN AC) 100-10 MG/5ML syrup Take 5 mLs by mouth 4 (four) times daily as needed for cough.    Marland Kitchen HYDROcodone-acetaminophen (NORCO) 10-325 MG tablet Take 1 tablet by mouth every 6 (six) hours as needed for moderate pain or severe pain.    . hydrocortisone 2.5 % ointment 1 application PR QID FOR 12 DAYS    . hydrOXYzine (VISTARIL) 25 MG capsule Take 25 mg by mouth daily as needed for itching.    . levocetirizine (XYZAL) 5 MG tablet Take 5 mg by mouth every evening.    . linaclotide (LINZESS) 290 MCG CAPS capsule Take 290 mcg by mouth daily.    . metFORMIN (GLUCOPHAGE) 500 MG tablet Take 500 mg by mouth 2 (two) times daily with a meal.    . metoprolol tartrate (LOPRESSOR) 25 MG tablet Take 25 mg by mouth 2 (two) times daily.     . nicotine (NICODERM CQ - DOSED IN MG/24 HOURS) 21 mg/24hr patch Place 21 mg onto the skin daily.    . Olopatadine HCl 0.2 % SOLN Apply 2 drops to eye 2 (two) times daily.    . OMALIZUMAB [place] Inject 150 Units into the skin every 14 (fourteen) days.    .      . prednisoLONE acetate (PRED FORTE) 1 % ophthalmic suspension Place 1 drop into both eyes 4 (four) times daily.    . QUEtiapine (SEROQUEL) 400 MG tablet Take 800 mg by mouth at bedtime.    . ranitidine (ZANTAC) 150 MG tablet Take 1 tablet by mouth daily.    Marland Kitchen topiramate (TOPAMAX) 100 MG tablet Take 100 mg by mouth 2 (two) times daily.    Marland Kitchen UNABLE TO FIND CPAP at bedtime    . valproic acid (DEPAKENE) 250 MG capsule Take 250 mg by mouth 4 (four) times daily.    .      .       Review of Systems PER HPI OTHERWISE ALL SYSTEMS ARE NEGATIVE.    Objective:   Physical Exam  Constitutional: She is oriented to person, place, and time. She appears well-developed and well-nourished. No distress.  HENT:  Head: Normocephalic and atraumatic.    Mouth/Throat: Oropharynx is clear and moist. No oropharyngeal exudate.  Eyes: Pupils are equal, round, and reactive to light. No scleral icterus.  Neck: Normal range of motion. Neck supple.  Cardiovascular: Normal rate, regular rhythm and normal heart sounds.   Pulmonary/Chest: Effort normal and breath sounds normal. No respiratory distress.  Abdominal: Soft. Bowel  sounds are normal. She exhibits no distension. There is tenderness. There is rebound (mild).  MILD TTP IN THE EPIGASTRIUM   Musculoskeletal: She exhibits no edema.  Lymphadenopathy:    She has no cervical adenopathy.  Neurological: She is alert and oriented to person, place, and time.  NO  NEW FOCAL DEFICITS  Psychiatric:  SLIGHTLY ANXIOUS MOOD, NL AFFECT  Vitals reviewed.     Assessment & Plan:

## 2017-02-02 NOTE — Progress Notes (Signed)
ON RECALL  °

## 2017-02-02 NOTE — Assessment & Plan Note (Signed)
SYMPTOMS CONTROLLED/RESOLVED ON NO MEDS.  CONTINUE TO MONITOR SYMPTOMS.

## 2017-02-02 NOTE — Assessment & Plan Note (Signed)
SYMPTOMS FAIRLY WELL CONTROLLED.  DRINK WATER  CONSIDER PLANT BASED DIET CONTINUE LINZESS. FOLLOW UP IN 6 MOS.

## 2017-02-02 NOTE — Assessment & Plan Note (Addendum)
SYMPTOMS FAIRLY WELL CONTROLLED. WEIGHT UP 4 LBS SINCE MAR 2017-BMI 39.  CONTINUE TO MONITOR SYMPTOMS. AVOID TRIGGERS LOSE WEIGHT. CONSIDER PLANT BASED DIET FOLLOW UP IN 6 MOS.

## 2017-02-02 NOTE — Progress Notes (Signed)
cc'ed to pcp °

## 2017-02-02 NOTE — Assessment & Plan Note (Signed)
SYMPTOMS CONTROLLED/RESOLVED.  CONTINUE TO MONITOR SYMPTOMS. 

## 2017-02-02 NOTE — Patient Instructions (Addendum)
DRINK WATER TO KEEP YOUR URINE LIGHT YELLOW.  YOU SHOULD TRANSITION TO A PLANT BASED DIET-NO MEAT OR DAIRY. AVOID ITEMS THAT CAUSE BLOATING & GAS. I RECOMMEND THE BOOK, "PREVENT AND REVERSE HEART DISEASE". CALDWELL ESSELTYN JR., MD. PAGE 120-121 CLEARLY STATE THE RULES AND QUICK AND EASY RECIPES FOR BREAKFAST, LUNCH, AND DINNER ARE AFTER P 127.  CONTINUE LINZESS.  CONTINUE NEXIUM. TAKE 30 MINUTES BEFORE MEALS TWICE DAILY.  FOLLOW UP IN 6 MOS.

## 2017-02-22 ENCOUNTER — Ambulatory Visit: Payer: Self-pay | Admitting: Orthopaedic Surgery

## 2017-03-09 ENCOUNTER — Ambulatory Visit: Payer: Managed Care, Other (non HMO) | Admitting: Gastroenterology

## 2017-03-17 ENCOUNTER — Ambulatory Visit: Payer: Self-pay | Admitting: Orthopaedic Surgery

## 2017-04-07 ENCOUNTER — Ambulatory Visit (INDEPENDENT_AMBULATORY_CARE_PROVIDER_SITE_OTHER): Payer: Managed Care, Other (non HMO) | Admitting: Orthopaedic Surgery

## 2017-04-07 ENCOUNTER — Encounter: Payer: Self-pay | Admitting: Orthopaedic Surgery

## 2017-04-07 VITALS — BP 127/86 | HR 69 | Temp 97.1°F | Ht 62.0 in | Wt 211.0 lb

## 2017-04-07 DIAGNOSIS — G8929 Other chronic pain: Secondary | ICD-10-CM | POA: Diagnosis not present

## 2017-04-07 DIAGNOSIS — M25511 Pain in right shoulder: Secondary | ICD-10-CM | POA: Diagnosis not present

## 2017-04-07 NOTE — Progress Notes (Signed)
PROCEDURE NOTE:  The patient request injection, verbal consent was obtained.  The right shoulder was prepped appropriately after time out was performed.   Sterile technique was observed and injection of 1 cc of Depo-Medrol 40 mg with several cc's of plain xylocaine. Anesthesia was provided by ethyl chloride and a 20-gauge needle was used to inject the shoulder area. A posterior approach was used.  The injection was tolerated well.  A band aid dressing was applied.  The patient was advised to apply ice later today and tomorrow to the injection sight as needed.  I will see her as needed.  Electronically Signed Sanjuana Kava, MD 6/21/20183:07 PM

## 2017-04-12 DIAGNOSIS — G43709 Chronic migraine without aura, not intractable, without status migrainosus: Secondary | ICD-10-CM | POA: Diagnosis not present

## 2017-04-12 DIAGNOSIS — M542 Cervicalgia: Secondary | ICD-10-CM | POA: Diagnosis not present

## 2017-04-18 DIAGNOSIS — J45909 Unspecified asthma, uncomplicated: Secondary | ICD-10-CM | POA: Diagnosis not present

## 2017-05-08 DIAGNOSIS — L0390 Cellulitis, unspecified: Secondary | ICD-10-CM | POA: Diagnosis not present

## 2017-05-08 DIAGNOSIS — L089 Local infection of the skin and subcutaneous tissue, unspecified: Secondary | ICD-10-CM | POA: Diagnosis not present

## 2017-05-08 DIAGNOSIS — W57XXXA Bitten or stung by nonvenomous insect and other nonvenomous arthropods, initial encounter: Secondary | ICD-10-CM | POA: Diagnosis not present

## 2017-05-08 DIAGNOSIS — T07XXXA Unspecified multiple injuries, initial encounter: Secondary | ICD-10-CM | POA: Diagnosis not present

## 2017-05-16 DIAGNOSIS — J45901 Unspecified asthma with (acute) exacerbation: Secondary | ICD-10-CM | POA: Diagnosis not present

## 2017-05-16 DIAGNOSIS — J441 Chronic obstructive pulmonary disease with (acute) exacerbation: Secondary | ICD-10-CM | POA: Diagnosis not present

## 2017-05-16 DIAGNOSIS — R0602 Shortness of breath: Secondary | ICD-10-CM | POA: Diagnosis not present

## 2017-05-16 DIAGNOSIS — J45909 Unspecified asthma, uncomplicated: Secondary | ICD-10-CM | POA: Diagnosis not present

## 2017-05-16 DIAGNOSIS — F411 Generalized anxiety disorder: Secondary | ICD-10-CM | POA: Diagnosis not present

## 2017-05-17 ENCOUNTER — Encounter (HOSPITAL_COMMUNITY): Payer: Self-pay | Admitting: Emergency Medicine

## 2017-05-17 ENCOUNTER — Emergency Department (HOSPITAL_COMMUNITY)
Admission: EM | Admit: 2017-05-17 | Discharge: 2017-05-17 | Disposition: A | Discharge status: Home Health | Payer: Managed Care, Other (non HMO) | Attending: Emergency Medicine | Admitting: Emergency Medicine

## 2017-05-17 ENCOUNTER — Emergency Department (HOSPITAL_COMMUNITY): Payer: Managed Care, Other (non HMO)

## 2017-05-17 DIAGNOSIS — Z7984 Long term (current) use of oral hypoglycemic drugs: Secondary | ICD-10-CM | POA: Insufficient documentation

## 2017-05-17 DIAGNOSIS — J189 Pneumonia, unspecified organism: Secondary | ICD-10-CM | POA: Insufficient documentation

## 2017-05-17 DIAGNOSIS — J45909 Unspecified asthma, uncomplicated: Secondary | ICD-10-CM | POA: Insufficient documentation

## 2017-05-17 DIAGNOSIS — R490 Dysphonia: Secondary | ICD-10-CM | POA: Insufficient documentation

## 2017-05-17 DIAGNOSIS — Z91010 Allergy to peanuts: Secondary | ICD-10-CM | POA: Diagnosis not present

## 2017-05-17 DIAGNOSIS — Z79899 Other long term (current) drug therapy: Secondary | ICD-10-CM | POA: Diagnosis not present

## 2017-05-17 DIAGNOSIS — R0602 Shortness of breath: Secondary | ICD-10-CM | POA: Diagnosis not present

## 2017-05-17 DIAGNOSIS — Z91040 Latex allergy status: Secondary | ICD-10-CM | POA: Insufficient documentation

## 2017-05-17 DIAGNOSIS — I509 Heart failure, unspecified: Secondary | ICD-10-CM | POA: Insufficient documentation

## 2017-05-17 DIAGNOSIS — Z87891 Personal history of nicotine dependence: Secondary | ICD-10-CM | POA: Diagnosis not present

## 2017-05-17 HISTORY — DX: Unspecified asthma, uncomplicated: J45.909

## 2017-05-17 LAB — CBC
HEMATOCRIT: 38.4 % (ref 36.0–46.0)
HEMOGLOBIN: 12.6 g/dL (ref 12.0–15.0)
MCH: 29.2 pg (ref 26.0–34.0)
MCHC: 32.8 g/dL (ref 30.0–36.0)
MCV: 88.9 fL (ref 78.0–100.0)
Platelets: 249 10*3/uL (ref 150–400)
RBC: 4.32 MIL/uL (ref 3.87–5.11)
RDW: 14 % (ref 11.5–15.5)
WBC: 10.4 10*3/uL (ref 4.0–10.5)

## 2017-05-17 LAB — BASIC METABOLIC PANEL
ANION GAP: 7 (ref 5–15)
BUN: 18 mg/dL (ref 6–20)
CHLORIDE: 108 mmol/L (ref 101–111)
CO2: 25 mmol/L (ref 22–32)
Calcium: 9.6 mg/dL (ref 8.9–10.3)
Creatinine, Ser: 0.84 mg/dL (ref 0.44–1.00)
GFR calc non Af Amer: >60 mL/min (ref >60)
GLUCOSE: 239 mg/dL — AB (ref 65–99)
POTASSIUM: 4.4 mmol/L (ref 3.5–5.1)
Sodium: 140 mmol/L (ref 135–145)

## 2017-05-17 LAB — TROPONIN I: Troponin I: <0.03 ng/mL (ref <0.03)

## 2017-05-17 MED ORDER — IOPAMIDOL (ISOVUE-300) INJECTION 61%
50.0000 mL | Freq: Once | INTRAVENOUS | Status: AC | PRN
  Administered 2017-05-17: 50 mL via INTRAVENOUS

## 2017-05-17 MED ORDER — LEVOFLOXACIN 500 MG PO TABS: 500.0000 mg | ORAL_TABLET | Freq: Every day | ORAL | 0 refills | Status: DC

## 2017-05-17 MED ORDER — LEVOFLOXACIN 500 MG PO TABS
500.0000 mg | ORAL_TABLET | Freq: Once | ORAL | Status: AC
  Administered 2017-05-17: 500 mg via ORAL
  Filled 2017-05-17: qty 1

## 2017-05-17 MED ORDER — IOPAMIDOL (ISOVUE-370) INJECTION 76%
100.0000 mL | Freq: Once | INTRAVENOUS | Status: AC | PRN
  Administered 2017-05-17: 100 mL via INTRAVENOUS

## 2017-05-17 NOTE — ED Triage Notes (Signed)
Pt c/o sob with her asthma since yesterday. Seen pcp and increased her nebs and inhalers and gave her 2 shots. Pt states sob worse today. Pt mildy anxious noted. Tearful. Pt consoled. Audible wheezing noted.

## 2017-05-17 NOTE — ED Provider Notes (Addendum)
[facility] DEPT Provider Note   CSN: [id number] Arrival date & time: 05/17/17  1836     History   Chief Complaint Chief Complaint  Patient presents with  . Shortness of Breath    HPI Kathryn Olson is a 56 y.o. female.  HPI Patient reports intermittent shortness of breath over the past several months.  Recently seen by the pulmonologist yesterday and chest x-ray was told to her to be normal.  Increase in her nebulized treatments and a steroid shot in the office given her history of asthma.  She also reports change in her voice over the past several days.  This is happened intermittently and has been associated with shortness of breath before in the past.  She states she's never had evaluation by an ear nose and throat surgeon.  No prior history of nasopharyngoscopy.  Denies anterior neck pain or swelling.  She feels more short of breath today.  Denies unilateral leg swelling.  Denies orthopnea.   Past Medical History:  Diagnosis Date  . Anxiety   . Asthma   . CHF (congestive heart failure) ([place])   . Chronic abdominal pain   . Chronic headaches   . Complication of anesthesia   . Crohn's disease [facility]) June 2013  . Environmental allergies   . Family history of anesthesia complication    MH during C Section  . H/O colonoscopy   . History of electroencephalogram 03/2014   normal  . IBS (irritable bowel syndrome) June 2014  . 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 Migraine   . Obsessive-compulsive disorder   . PTSD (post-traumatic stress disorder)   . Seizures [facility])     Patient Active Problem List   Diagnosis Date Noted  . Rectal bleeding 11/11/2016  . Pain in right arm 08/04/2016  . Chest wall muscle strain 08/06/2015  . Crohn's disease ([place]) 09/04/2013  . IBS (irritable bowel syndrome) 09/04/2013  . Depression 09/04/2013  . Dysthymic disorder 01/29/2013  . PTSD (post-traumatic stress disorder) 01/29/2013  . Chronic back pain  greater than 3 months duration 01/29/2013  . CNS disorder 01/29/2013  . Insomnia secondary to depression with anxiety 01/29/2013  . Insomnia secondary to chronic pain 01/29/2013  . Constipation 05/10/2012  . Weight loss, abnormal 05/10/2012  . Abdominal pain 05/10/2012  . Vomiting 05/10/2012  . GERD (gastroesophageal reflux disease) 05/10/2012    Past Surgical History:  Procedure Laterality Date  . BLADDER SURGERY     age 16  . CESAREAN SECTION     X2  . COLONOSCOPY  05/22/2012   SLF: Polyps, multiple hyperplastic in the sigmoid colon/Polyp in the rectum/  MODERATE Diverticulosis throughout the colon/ Internal hemorrhoids  . ESOPHAGOGASTRODUODENOSCOPY     remote, had ulcers, [place], Dr. Truddie Coco  . ESOPHAGOGASTRODUODENOSCOPY  05/22/2012   SLF: Stricture in the distal esophagus/Moderate gastritis  . PARTIAL HYSTERECTOMY    . TUBAL LIGATION      OB History    No data available       Home Medications    Prior to Admission medications   Medication Sig Start Date End Date Taking? Authorizing Provider  albuterol (PROVENTIL HFA;VENTOLIN HFA) 108 (90 Base) MCG/ACT inhaler Inhale 2 puffs into the lungs every 6 (six) hours as needed for wheezing or shortness of breath.    [provider]  albuterol (PROVENTIL) (5 MG/ML) 0.5% nebulizer solution Take 2.5 mg by nebulization every 6 (six) hours as needed for wheezing  or shortness of breath.    [provider]  ALPRAZolam Duanne Moron) 0.5 MG tablet Take 0.5 mg by mouth 3 (three) times daily as needed for anxiety.    [provider]  amitriptyline (ELAVIL) 50 MG tablet Take 1 tablet by mouth daily. 09/12/16   [provider]  Bepotastine Besilate 1.5 % SOLN Place 1 drop into both eyes 2 (two) times daily.    [provider]  busPIRone (BUSPAR) 15 MG tablet Take 15 mg by mouth 4 (four) times daily.    [provider]  diclofenac sodium (VOLTAREN) 1 % GEL Apply 2 g topically 2 (two) times  daily. Shoulder pain    [provider]  diphenhydrAMINE (BENADRYL) 25 MG tablet Take 25 mg by mouth every 6 (six) hours as needed for itching or allergies.    [provider]  divalproex (DEPAKOTE) 500 MG DR tablet Take 500 mg by mouth daily.    [provider]  DULoxetine (CYMBALTA) 60 MG capsule Take 60 mg by mouth daily.    [provider]  EPINEPHrine 0.3 mg/0.3 mL IJ SOAJ injection Inject 0.3 mg into the muscle as needed.    [provider]  esomeprazole (NEXIUM) 40 MG capsule 1 PO 30 MINUTES PRIOR TO MEALS BID 01/28/16   Fields, Marga Melnick, MD  famotidine (PEPCID) 20 MG tablet Take 20 mg by mouth daily.    [provider]  fluticasone (FLONASE) 50 MCG/ACT nasal spray Place 1 spray into both nostrils 2 (two) times daily. 08/13/16   [provider]  furosemide (LASIX) 40 MG tablet Take 40 mg by mouth daily.  07/12/16   [provider]  gabapentin (NEURONTIN) 300 MG capsule Take 300 mg by mouth 3 (three) times daily.    [provider]  guaifenesin (ROBITUSSIN) 100 MG/5ML syrup Take 30 mLs by mouth 3 (three) times daily as needed for cough.    [provider]  guaiFENesin-codeine (ROBITUSSIN AC) 100-10 MG/5ML syrup Take 5 mLs by mouth 4 (four) times daily as needed for cough.    [provider]  HYDROcodone-acetaminophen (NORCO) 10-325 MG tablet Take 1 tablet by mouth every 6 (six) hours as needed for moderate pain or severe pain.    [provider]  hydrocortisone 2.5 % ointment 1 application PR QID FOR 12 DAYS 11/11/16   Fields, Marga Melnick, MD  hydrOXYzine (VISTARIL) 25 MG capsule Take 25 mg by mouth daily as needed for itching.    [provider]  levocetirizine (XYZAL) 5 MG tablet Take 5 mg by mouth every evening.    [provider]  linaclotide (LINZESS) 290 MCG CAPS capsule Take 290 mcg by mouth daily. 10/25/16   [provider]  metFORMIN (GLUCOPHAGE) 500 MG tablet  Take 500 mg by mouth 2 (two) times daily with a meal.    [provider]  metoprolol tartrate (LOPRESSOR) 25 MG tablet Take 25 mg by mouth 2 (two) times daily.  07/19/16   [provider]  nicotine (NICODERM CQ - DOSED IN MG/24 HOURS) 21 mg/24hr patch Place 21 mg onto the skin daily.    [provider]  Olopatadine HCl 0.2 % SOLN Apply 2 drops to eye 2 (two) times daily.    [provider]  OMALIZUMAB [place] Inject 150 Units into the skin every 14 (fourteen) days.    [provider]  phentermine 37.5 MG capsule Take 37.5 mg by mouth every morning.    [provider]  prednisoLONE  acetate (PRED FORTE) 1 % ophthalmic suspension Place 1 drop into both eyes 4 (four) times daily.    [provider]  QUEtiapine (SEROQUEL) 400 MG tablet Take 800 mg by mouth at bedtime.    [provider]  ranitidine (ZANTAC) 150 MG tablet Take 1 tablet by mouth daily. 06/23/16   [provider]  topiramate (TOPAMAX) 100 MG tablet Take 100 mg by mouth 2 (two) times daily. 11/30/16   [provider]  UNABLE TO FIND CPAP at bedtime    [provider]  valproic acid (DEPAKENE) 250 MG capsule Take 250 mg by mouth 4 (four) times daily. 10/15/16   [provider]    Family History Family History  Problem Relation Age of Onset  . Colon cancer Maternal Grandfather        ?less than age 30s  . Heart attack Father   . Alcohol abuse Father   . Diabetes Mother   . Hypertension Mother   . Dementia Mother   . Depression Mother   . Liver disease Brother        does know details, but related to MVA?  Marland Kitchen Anxiety disorder Sister   . Bipolar disorder Sister   . OCD Sister   . Anxiety disorder Sister   . Depression Sister   . Malignant hyperthermia Daughter   . Heart murmur Son   . Inflammatory bowel disease Neg Hx   . ADD / ADHD Neg Hx   . Drug abuse Neg Hx   . Paranoid behavior Neg Hx   . Schizophrenia Neg Hx   . Seizures  Neg Hx   . Sexual abuse Neg Hx   . Physical abuse Neg Hx   . Colon polyps Neg Hx     Social History Social History  Substance Use Topics  . Smoking status: Former Smoker    Quit date: 01/01/1989  . Smokeless tobacco: Never Used     Comment: Quit a few years ago  . Alcohol use No     Comment: socially     Allergies   Aspirin; Peanut-containing drug products; Penicillins; Sulfa antibiotics; Codeine; Adhesive [tape]; Latex; and Rubbing alcohol [alcohol]   Review of Systems Review of Systems  All other systems reviewed and are negative.    Physical Exam Updated Vital Signs BP 139/79   Pulse 80   Temp 98.7 F (37.1 C) (Oral)   Resp (!) 21   Ht 5' 2"  (1.575 m)   Wt 100.2 kg (221 lb)   SpO2 96%   BMI 40.42 kg/m   Physical Exam  Constitutional: She is oriented to person, place, and time. She appears well-developed and well-nourished. No distress.  HENT:  Head: Normocephalic and atraumatic.  Eyes: EOM are normal.  Neck: Normal range of motion. Neck supple. No tracheal deviation present. No thyromegaly present.  Cardiovascular: Normal rate, regular rhythm and normal heart sounds.   Pulmonary/Chest: Effort normal. No stridor. She has wheezes.  Abdominal: Soft. She exhibits no distension. There is no tenderness.  Musculoskeletal: Normal range of motion.  Neurological: She is alert and oriented to person, place, and time.  Skin: Skin is warm and dry.  Psychiatric: She has a normal mood and affect. Judgment normal.  Nursing note and vitals reviewed.    ED Treatments / Results  Labs (all labs ordered are listed, but only abnormal results are displayed) Labs Reviewed  BASIC METABOLIC PANEL - Abnormal; Notable for the following:       Result Value  Glucose, Bld 239 (*)    All other components within normal limits  CBC  TROPONIN I    EKG  EKG Interpretation  Date/Time:  Tuesday May 17 2017 18:49:59 EDT Ventricular Rate:  74 PR Interval:    QRS  Duration: 83 QT Interval:  408 QTC Calculation: 453 R Axis:   27 Text Interpretation:  Sinus rhythm Abnormal R-wave progression, early transition No significant change was found Confirmed by Jola Schmidt [phone]) on 05/17/2017 6:59:10 PM       Radiology Dg Neck Soft Tissue  Result Date: 05/17/2017 CLINICAL DATA:  56 year old female with shortness of breath and neck pain. EXAM: NECK SOFT TISSUES - 1+ VIEW COMPARISON:  CT of the neck dated 10/16/2016 FINDINGS: There is no evidence of retropharyngeal soft tissue swelling or epiglottic enlargement. The cervical airway is unremarkable and no radio-opaque foreign body identified. IMPRESSION: Negative. Electronically Signed   By: Anner Crete M.D.   On: 05/17/2017 20:24   Dg Chest 2 View  Result Date: 05/17/2017 CLINICAL DATA:  56 year old female with shortness of breath and asthma. EXAM: CHEST  2 VIEW COMPARISON:  Chest radiograph dated 09/19/2016 FINDINGS: There is shallow inspiration with minimal bibasilar atelectasis. No focal consolidation, pleural effusion, or pneumothorax. The cardiac silhouette is within normal limits. No acute osseous pathology. IMPRESSION: No active cardiopulmonary disease. Electronically Signed   By: Anner Crete M.D.   On: 05/17/2017 20:23   Ct Soft Tissue Neck W Contrast  Result Date: 05/17/2017 CLINICAL DATA:  Initial evaluation for acute shortness of breath, hoarseness. EXAM: CT NECK WITH CONTRAST TECHNIQUE: Multidetector CT imaging of the neck was performed using the standard protocol following the bolus administration of intravenous contrast. CONTRAST:  58m ISOVUE-300 IOPAMIDOL (ISOVUE-300) INJECTION 61% COMPARISON:  None. FINDINGS: Pharynx and larynx: Oral cavity within normal limits without mass lesion or loculated fluid collection. No acute abnormality about the visualized dentition. Left hand tonsils symmetric and normal. Parapharyngeal fat preserved. Nasopharynx within normal limits. Retropharyngeal soft  tissues normal. Epiglottis normal. Vallecula clear. Remainder of the hypopharynx and supraglottic larynx within normal limits. True cords symmetric and normal. Subglottic airway clear. Salivary glands: Salivary glands including the parotid and and submandibular glands are normal. Thyroid: Right lobe of thyroid see pins exterior lesion into the lower right retropharyngeal soft tissues. Thyroid otherwise unremarkable. Lymph nodes: No adenopathy within the neck. Vascular: Normal intravascular enhancement seen throughout the neck. Carotid artery's are medialized into the retropharyngeal space bilaterally. Limited intracranial: Unremarkable. Visualized orbits: Visualized globes and orbital soft tissues within normal limits. Mastoids and visualized paranasal sinuses: Paranasal sinuses and mastoid air cells are clear. Middle ear cavities are clear. Skeleton: No acute osseous abnormality. Mild to moderate degenerative spondylolysis present at C5-6 and C6-7. No worrisome lytic or blastic osseous lesions. Upper chest: Visualized upper chest within normal limits. Partially visualized lungs are clear. IMPRESSION: Negative CT of the neck. No acute soft tissue abnormality or significant inflammatory changes identified. Electronically Signed   By: BJeannine BogaM.D.   On: 05/17/2017 21:46   Ct Angio Chest Pe W And/or Wo Contrast  Result Date: 05/17/2017 CLINICAL DATA:  Shortness of breath.  Asthma. EXAM: CT ANGIOGRAPHY CHEST WITH CONTRAST TECHNIQUE: Multidetector CT imaging of the chest was performed using the standard protocol during bolus administration of intravenous contrast. Multiplanar CT image reconstructions and MIPs were obtained to evaluate the vascular anatomy. CONTRAST:  580mISOVUE-300 IOPAMIDOL (ISOVUE-300) INJECTION 61% COMPARISON:  Chest radiographs obtained earlier today. FINDINGS: Cardiovascular: Normally opacified pulmonary arteries with  no pulmonary arterial filling defects seen. Borderline enlarged  heart with prominent epicardial fat pads, specially on the right. Minimal aortic calcification. Mediastinum/Nodes: No enlarged mediastinal, hilar, or axillary lymph nodes. Thyroid gland, trachea, and esophagus demonstrate no significant findings. Lungs/Pleura: Minimal patchy opacity in the posterior aspect of the left upper lobe. Upper Abdomen: Unremarkable. Musculoskeletal: Thoracic and lower cervical spine degenerative changes. Review of the MIP images confirms the above findings. IMPRESSION: 1. No pulmonary emboli or acute abnormality. No pulmonary emboli seen. 2. Minimal patchy opacity in the posterior aspect of the right upper lobe with a tree-in-bud pattern, compatible with a small focus of infection. 3. Minimal aortic atherosclerosis. Aortic Atherosclerosis (ICD10-I70.0). Electronically Signed   By: Claudie Revering M.D.   On: 05/17/2017 21:48    Procedures Procedures (including critical care time)  Medications Ordered in ED Medications  levofloxacin (LEVAQUIN) tablet 500 mg (not administered)  iopamidol (ISOVUE-370) 76 % injection 100 mL (100 mLs Intravenous Contrast Given 05/17/17 2122)  iopamidol (ISOVUE-300) 61 % injection 50 mL (50 mLs Intravenous Contrast Given 05/17/17 2123)     Initial Impression / Assessment and Plan / ED Course  I have reviewed the triage vital signs and the nursing notes.  Pertinent labs & imaging results that were available during my care of the patient were reviewed by me and considered in my medical decision making (see chart for details).     Pt will undergo imaging of chest and neck at this time.  May represent vocal cord dysfunction.  Will continue to follow closely while in the ER  . CT with possible PNA. Home with abx. ENT follow up. pcp follow up. Understands to return to the ER for new or worsening symptoms   Final Clinical Impressions(s) / ED Diagnoses   Final diagnoses:  Community acquired pneumonia, unspecified laterality  Hoarse voice quality     New Prescriptions New Prescriptions   LEVOFLOXACIN (LEVAQUIN) 500 MG TABLET    Take 1 tablet (500 mg total) by mouth daily.     Jola Schmidt, MD 05/17/17 2159    Jola Schmidt, MD 05/17/17 2200

## 2017-06-08 ENCOUNTER — Encounter: Payer: Self-pay | Admitting: Cardiology

## 2017-06-14 DIAGNOSIS — M545 Low back pain: Secondary | ICD-10-CM | POA: Diagnosis not present

## 2017-06-14 DIAGNOSIS — G4733 Obstructive sleep apnea (adult) (pediatric): Secondary | ICD-10-CM | POA: Diagnosis not present

## 2017-06-14 DIAGNOSIS — G43919 Migraine, unspecified, intractable, without status migrainosus: Secondary | ICD-10-CM | POA: Diagnosis not present

## 2017-06-14 DIAGNOSIS — M542 Cervicalgia: Secondary | ICD-10-CM | POA: Diagnosis not present

## 2017-06-14 DIAGNOSIS — F3131 Bipolar disorder, current episode depressed, mild: Secondary | ICD-10-CM | POA: Diagnosis not present

## 2017-06-28 ENCOUNTER — Encounter: Payer: Self-pay | Admitting: Gastroenterology

## 2017-06-29 ENCOUNTER — Ambulatory Visit (INDEPENDENT_AMBULATORY_CARE_PROVIDER_SITE_OTHER): Payer: Managed Care, Other (non HMO) | Admitting: Cardiology

## 2017-06-29 ENCOUNTER — Encounter: Payer: Self-pay | Admitting: Cardiology

## 2017-06-29 VITALS — BP 110/82 | HR 84 | Ht 62.0 in | Wt 218.8 lb

## 2017-06-29 DIAGNOSIS — I10 Essential (primary) hypertension: Secondary | ICD-10-CM | POA: Diagnosis not present

## 2017-06-29 DIAGNOSIS — I5032 Chronic diastolic (congestive) heart failure: Secondary | ICD-10-CM

## 2017-06-29 NOTE — Progress Notes (Signed)
Electrophysiology Office Note   Date:  06/29/2017   ID:  Kathryn Olson, DOB Aug 05, 1961, MRN [id number]  PCP:  Emelda Fear, DO  Primary Electrophysiologist:  Constance Haw, MD    Chief Complaint  Patient presents with  . Follow-up    Palpitation/SOB     History of Present Illness: Kathryn Olson is a 56 y.o. female who presents today for electrophysiology evaluation.   She was admitted to the hospital in [place] in August with apparently an anaphylactic reaction. At that time she had an echocardiogram and told that she was in congestive heart failure. Prior to that, she says that she was having chest pain.  He had a nuclear stress test performed which showed no evidence of ischemia. She was having palpitations. She wore a 30 day monitor that showed no evidence of tachycardia arrhythmia. She presents today for follow-up.  Today, denies symptoms of palpitations, chest pain, shortness of breath, orthopnea, PND, lower extremity edema, claudication, dizziness, presyncope, syncope, bleeding, or neurologic sequela. The patient is tolerating medications without difficulties. She has been feeling poorly over the last 3 days. She has a sore throat and ear pain as well as nasal congestion. She feels that she has been having a viral infection. She also had pneumonia a few months ago. She is planning to call her primary physician.     Past Medical History:  Diagnosis Date  . Anxiety   . Asthma   . CHF (congestive heart failure) ([place])   . Chronic abdominal pain   . Chronic headaches   . Complication of anesthesia   . Crohn's disease [facility]) June 2013  . Environmental allergies   . Family history of anesthesia complication    MH during C Section  . H/O colonoscopy   . History of electroencephalogram 03/2014   normal  . IBS (irritable bowel syndrome) June 2014  . 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 Migraine   . Obsessive-compulsive disorder   .  PTSD (post-traumatic stress disorder)   . Seizures ([place])    Past Surgical History:  Procedure Laterality Date  . BLADDER SURGERY     age 65  . CESAREAN SECTION     X2  . COLONOSCOPY  05/22/2012   SLF: Polyps, multiple hyperplastic in the sigmoid colon/Polyp in the rectum/  MODERATE Diverticulosis throughout the colon/ Internal hemorrhoids  . ESOPHAGOGASTRODUODENOSCOPY     remote, had ulcers, [place], Dr. Truddie Coco  . ESOPHAGOGASTRODUODENOSCOPY  05/22/2012   SLF: Stricture in the distal esophagus/Moderate gastritis  . PARTIAL HYSTERECTOMY    . TUBAL LIGATION       Current Outpatient Prescriptions  Medication Sig Dispense Refill  . albuterol (PROVENTIL HFA;VENTOLIN HFA) 108 (90 Base) MCG/ACT inhaler Inhale 2 puffs into the lungs every 6 (six) hours as needed for wheezing or shortness of breath.    Marland Kitchen albuterol (PROVENTIL) (5 MG/ML) 0.5% nebulizer solution Take 2.5 mg by nebulization every 6 (six) hours as needed for wheezing or shortness of breath.    . ALPRAZolam (XANAX) 0.5 MG tablet Take 0.5 mg by mouth 3 (three) times daily as needed for anxiety.    Marland Kitchen amitriptyline (ELAVIL) 50 MG tablet Take 1 tablet by mouth daily.    . Bepotastine Besilate 1.5 % SOLN Place 1 drop into both eyes 2 (two) times daily.    . busPIRone (BUSPAR) 15 MG tablet Take 15 mg by mouth 4 (four) times daily.    Marland Kitchen  diclofenac sodium (VOLTAREN) 1 % GEL Apply 2 g topically 2 (two) times daily. Shoulder pain    . diphenhydrAMINE (BENADRYL) 25 MG tablet Take 25 mg by mouth every 6 (six) hours as needed for itching or allergies.    Marland Kitchen divalproex (DEPAKOTE) 500 MG DR tablet Take 500 mg by mouth daily.    . DULoxetine (CYMBALTA) 60 MG capsule Take 60 mg by mouth daily.    Marland Kitchen EPINEPHrine 0.3 mg/0.3 mL IJ SOAJ injection Inject 0.3 mg into the muscle as needed.    Marland Kitchen esomeprazole (NEXIUM) 40 MG capsule 1 PO 30 MINUTES PRIOR TO MEALS BID 60 capsule 11  . famotidine (PEPCID) 20 MG tablet Take 20 mg by mouth daily.    .  fluticasone (FLONASE) 50 MCG/ACT nasal spray Place 1 spray into both nostrils 2 (two) times daily.    . furosemide (LASIX) 40 MG tablet Take 40 mg by mouth daily.     Marland Kitchen gabapentin (NEURONTIN) 300 MG capsule Take 300 mg by mouth 3 (three) times daily.    Marland Kitchen guaifenesin (ROBITUSSIN) 100 MG/5ML syrup Take 30 mLs by mouth 3 (three) times daily as needed for cough.    Marland Kitchen guaiFENesin-codeine (ROBITUSSIN AC) 100-10 MG/5ML syrup Take 5 mLs by mouth 4 (four) times daily as needed for cough.    Marland Kitchen HYDROcodone-acetaminophen (NORCO) 10-325 MG tablet Take 1 tablet by mouth every 6 (six) hours as needed for moderate pain or severe pain.    . hydrocortisone 2.5 % ointment 1 application PR QID FOR 12 DAYS 30 g 1  . hydrOXYzine (VISTARIL) 25 MG capsule Take 25 mg by mouth daily as needed for itching.    . levocetirizine (XYZAL) 5 MG tablet Take 5 mg by mouth every evening.    Marland Kitchen levofloxacin (LEVAQUIN) 500 MG tablet Take 1 tablet (500 mg total) by mouth daily. 7 tablet 0  . linaclotide (LINZESS) 290 MCG CAPS capsule Take 290 mcg by mouth daily.    . metFORMIN (GLUCOPHAGE) 500 MG tablet Take 500 mg by mouth 2 (two) times daily with a meal.    . metoprolol tartrate (LOPRESSOR) 25 MG tablet Take 25 mg by mouth 2 (two) times daily.     . nicotine (NICODERM CQ - DOSED IN MG/24 HOURS) 21 mg/24hr patch Place 21 mg onto the skin daily.    . Olopatadine HCl 0.2 % SOLN Apply 2 drops to eye 2 (two) times daily.    . OMALIZUMAB  Inject 150 Units into the skin every 14 (fourteen) days.    . phentermine 37.5 MG capsule Take 37.5 mg by mouth every morning.    . prednisoLONE acetate (PRED FORTE) 1 % ophthalmic suspension Place 1 drop into both eyes 4 (four) times daily.    . QUEtiapine (SEROQUEL) 400 MG tablet Take 800 mg by mouth at bedtime.    . ranitidine (ZANTAC) 150 MG tablet Take 1 tablet by mouth daily.    Marland Kitchen topiramate (TOPAMAX) 100 MG tablet Take 100 mg by mouth 2 (two) times daily.    Marland Kitchen UNABLE TO FIND CPAP at bedtime      . valproic acid (DEPAKENE) 250 MG capsule Take 250 mg by mouth 4 (four) times daily.     No current facility-administered medications for this visit.     Allergies:   Aspirin; Peanut-containing drug products; Penicillins; Sulfa antibiotics; Codeine; Adhesive [tape]; Latex; and Rubbing alcohol [alcohol]   Social History:  The patient  reports that she quit smoking about 28 years ago. She  has never used smokeless tobacco. She reports that she does not drink alcohol or use drugs.   Family History:  The patient's family history includes Alcohol abuse in her father; Anxiety disorder in her sister and sister; Bipolar disorder in her sister; Colon cancer in her maternal grandfather; Dementia in her mother; Depression in her mother and sister; Diabetes in her mother; Heart attack in her father; Heart murmur in her son; Hypertension in her mother; Liver disease in her brother; Malignant hyperthermia in her daughter; OCD in her sister.    ROS:  Please see the history of present illness.   Otherwise, review of systems is positive for weight change, appetite change, chills, sweats, fever, leg swelling, shortness of breath, leg pain, visual changes, cough, dyspnea on exertion, snoring, wheezing, constipation, nausea, depression, anxiety, back pain, muscle pain, balance problems, headaches, dizziness.   All other systems are reviewed and negative.   PHYSICAL EXAM: VS:  BP 110/82   Pulse 84   Ht 5' 2"  (1.575 m)   Wt 218 lb 12.8 oz (99.2 kg)   BMI 40.02 kg/m  , BMI Body mass index is 40.02 kg/m. GEN: Well nourished, well developed, in no acute distress  HEENT: normal  Neck: no JVD, carotid bruits, or masses Cardiac: RRR; no murmurs, rubs, or gallops,no edema  Respiratory:  clear to auscultation bilaterally, normal work of breathing GI: soft, nontender, nondistended, + BS MS: no deformity or atrophy  Skin: warm and dry Neuro:  Strength and sensation are intact Psych: euthymic mood, full  affect  EKG:  EKG is not ordered today. Personal review of the ekg ordered 05/17/17 shows SR, rate 74   Recent Labs: 05/17/2017: BUN 18; Creatinine, Ser 0.84; Hemoglobin 12.6; Platelets 249; Potassium 4.4; Sodium 140    Lipid Panel     Component Value Date/Time   CHOL 203 (H) 09/04/2013 1728   TRIG 185 (H) 09/04/2013 1728   HDL 59 09/04/2013 1728   LDLCALC 107 (H) 09/04/2013 1728     Wt Readings from Last 3 Encounters:  06/29/17 218 lb 12.8 oz (99.2 kg)  05/17/17 221 lb (100.2 kg)  04/07/17 211 lb (95.7 kg)      Other studies Reviewed: Additional studies/ records that were reviewed today include: Myoview 01/05/17  Nuclear stress EF: 55%.  There was no ST segment deviation noted during stress.  The study is normal.  The left ventricular ejection fraction is normal (55-65%).   1. EF 55%, normal wall motion.  2. No significant perfusion defect, no evidence for ischemia or infarction.  3. Normal study.    ASSESSMENT AND PLAN:  1.  Hypertension: Well-controlled today. No medication changes at this time.  2. Chest pain: Nuclear stress test without evidence of ischemia. Chest pain was likely noncardiac. No further workup.  3. Palpitations: Has worn a monitor that showed no obvious arrhythmias. No changes at this time.  4. Reported congestive heart failure: Ejection fraction normal on most recent echocardiogram. Jaziel Bennett not adjust medications at this time. Does not appear to be volume overloaded.  Current medicines are reviewed at length with the patient today.   The patient does not have concerns regarding her medicines.  The following changes were made today:  none  Labs/ tests ordered today include:  No orders of the defined types were placed in this encounter.    Disposition:   FU with Angelic Schnelle 12 month  Signed, Avalee Castrellon Meredith Leeds, MD  06/29/2017 12:08 PM     [facility]  [address] [place] [address] [phone]  (office) [phone] (fax)

## 2017-07-01 DIAGNOSIS — J020 Streptococcal pharyngitis: Secondary | ICD-10-CM | POA: Diagnosis not present

## 2017-07-01 DIAGNOSIS — J029 Acute pharyngitis, unspecified: Secondary | ICD-10-CM | POA: Diagnosis not present

## 2017-07-14 DIAGNOSIS — Z2820 Immunization not carried out because of patient decision for unspecified reason: Secondary | ICD-10-CM | POA: Diagnosis not present

## 2017-07-14 DIAGNOSIS — E119 Type 2 diabetes mellitus without complications: Secondary | ICD-10-CM | POA: Diagnosis not present

## 2017-07-15 ENCOUNTER — Other Ambulatory Visit: Payer: Self-pay | Admitting: Gastroenterology

## 2017-08-02 DIAGNOSIS — K0889 Other specified disorders of teeth and supporting structures: Secondary | ICD-10-CM | POA: Diagnosis not present

## 2017-10-20 DIAGNOSIS — L259 Unspecified contact dermatitis, unspecified cause: Secondary | ICD-10-CM | POA: Diagnosis not present

## 2017-11-02 ENCOUNTER — Other Ambulatory Visit: Payer: Self-pay | Admitting: Family Medicine

## 2017-11-02 DIAGNOSIS — Z139 Encounter for screening, unspecified: Secondary | ICD-10-CM

## 2017-11-03 ENCOUNTER — Ambulatory Visit (INDEPENDENT_AMBULATORY_CARE_PROVIDER_SITE_OTHER): Payer: Managed Care, Other (non HMO) | Admitting: Gastroenterology

## 2017-11-03 ENCOUNTER — Encounter: Payer: Self-pay | Admitting: Gastroenterology

## 2017-11-03 DIAGNOSIS — K219 Gastro-esophageal reflux disease without esophagitis: Secondary | ICD-10-CM | POA: Diagnosis not present

## 2017-11-03 DIAGNOSIS — G43A0 Cyclical vomiting, not intractable: Secondary | ICD-10-CM

## 2017-11-03 DIAGNOSIS — K625 Hemorrhage of anus and rectum: Secondary | ICD-10-CM | POA: Diagnosis not present

## 2017-11-03 DIAGNOSIS — K5901 Slow transit constipation: Secondary | ICD-10-CM | POA: Diagnosis not present

## 2017-11-03 DIAGNOSIS — R1115 Cyclical vomiting syndrome unrelated to migraine: Secondary | ICD-10-CM

## 2017-11-03 NOTE — Assessment & Plan Note (Signed)
SYMPTOMS FAIRLY WELL CONTROLLED.  DRINK WATER TO KEEP YOUR URINE LIGHT YELLOW. CONTINUE YOUR WEIGHT LOSS EFFORTS. Eat fiber. CONTINUE LINZESS. FOLLOW UP IN 6 MOS.

## 2017-11-03 NOTE — Assessment & Plan Note (Signed)
SYMPTOMS CONTROLLED/RESOLVED.  DRINK WATER TO KEEP YOUR URINE LIGHT YELLOW. CONTINUE YOUR WEIGHT LOSS EFFORTS.Marland Kitchen CONTINUE NEXIUM. TAKE 30 MINUTES BEFORE MEALS ONCE OR TWICE DAILY. FOLLOW UP IN 6 MOS.

## 2017-11-03 NOTE — Assessment & Plan Note (Signed)
SYMPTOMS FAIRLY WELL CONTROLLED.  CONTINUE TO MONITOR SYMPTOMS. 

## 2017-11-03 NOTE — Patient Instructions (Signed)
DRINK WATER TO KEEP YOUR URINE LIGHT YELLOW.  CONTINUE YOUR WEIGHT LOSS EFFORTS.Marland Kitchen  CONTINUE LINZESS.  CONTINUE NEXIUM. TAKE 30 MINUTES BEFORE MEALS ONCE OR TWICE DAILY.  FOLLOW UP IN 6 MOS.

## 2017-11-03 NOTE — Progress Notes (Signed)
ON RECALL  °

## 2017-11-03 NOTE — Assessment & Plan Note (Signed)
Occasional. NO concerning SIGNS/SYMPTOMS.  CONTINUE TO MONITOR SYMPTOMS. USE hydrocortisone ointment FOUR TIMES  A DAY IF NEEDED TO RELIEVE RECTAL PAIN/PRESSURE/BLEEDING.

## 2017-11-03 NOTE — Progress Notes (Signed)
Subjective:    Patient ID: Kathryn Olson, female    DOB: [birth year], 57 y.o.   MRN: [id number]  Emelda Fear, DO  HPI DAUGHTER IN LAW HAD SURGERY AND HAD TO BE ADMITTED FOR OBSERVATION. BOWELS: CONSTIPATED EVER NOW AND THEN. EATING FIBER AND GREENS. HUSBAND PLANTED GARDEN AND HAD FRESH VEGGIES. USU. BOWEL MOVE ONCE A WEEK. HAS LEFT  FLANK PAIN. SOB AFTER GOING TO [place]. WHEN CONSTIPATED NAY SEE SPOTS OF BLOOD AFTER STRAINING.   PT DENIES FEVER, CHILLS, HEMATEMESIS, nausea, vomiting, melena, diarrhea, CHEST PAIN, CHANGE IN BOWEL IN HABITS, abdominal pain, problems swallowing, OR heartburn or indigestion.  Past Medical History:  Diagnosis Date  . Anxiety   . Asthma   . CHF (congestive heart failure) ([place])   . Chronic abdominal pain   . Chronic headaches   . Complication of anesthesia   . Crohn's disease [facility]) June 2013  . Environmental allergies   . Family history of anesthesia complication    MH during C Section  . H/O colonoscopy   . History of electroencephalogram 03/2014   normal  . IBS (irritable bowel syndrome) June 2014  . 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 Migraine   . Obsessive-compulsive disorder   . PTSD (post-traumatic stress disorder)   . Seizures ([place])    Past Surgical History:  Procedure Laterality Date  . BLADDER SURGERY     age 21  . CESAREAN SECTION     X2  . COLONOSCOPY  05/22/2012   SLF: Polyps, multiple hyperplastic in the sigmoid colon/Polyp in the rectum/  MODERATE Diverticulosis throughout the colon/ Internal hemorrhoids  . ESOPHAGOGASTRODUODENOSCOPY     remote, had ulcers, [place], Dr. Truddie Coco  . ESOPHAGOGASTRODUODENOSCOPY  05/22/2012   SLF: Stricture in the distal esophagus/Moderate gastritis  . PARTIAL HYSTERECTOMY    . TUBAL LIGATION     Allergies  Allergen Reactions  . Aspirin Anaphylaxis  . Peanut-Containing Drug Products Anaphylaxis       . Penicillins Anaphylaxis      . Sulfa  Antibiotics Anaphylaxis  . Codeine Itching  . Adhesive [Tape] Rash  . Latex Rash  . Rubbing Alcohol [Alcohol] Rash    Current Outpatient Medications  Medication Sig Dispense Refill  . albuterol (PROVENTIL HFA;VENTOLIN HFA) 108 (90 Base) MCG/ACT inhaler Inhale 2 puffs into the lungs every 6 (six) hours as needed for wheezing or shortness of breath.    Marland Kitchen albuterol (PROVENTIL) (5 MG/ML) 0.5% nebulizer solution Take 2.5 mg by nebulization every 6 (six) hours as needed for wheezing or shortness of breath.    . ALPRAZolam (XANAX) 0.5 MG tablet Take 0.5 mg by mouth 3 (three) times daily as needed for anxiety.    Marland Kitchen amitriptyline (ELAVIL) 50 MG tablet Take 1 tablet by mouth daily.    . Bepotastine Besilate 1.5 % SOLN Place 1 drop into both eyes 2 (two) times daily.    . busPIRone (BUSPAR) 15 MG tablet Take 15 mg by mouth as needed.     . diclofenac sodium (VOLTAREN) 1 % GEL Apply 2 g topically 2 (two) times daily. Shoulder pain    . diphenhydrAMINE (BENADRYL) 25 MG tablet Take 25 mg by mouth every 6 (six) hours as needed for itching or allergies.    Marland Kitchen divalproex (DEPAKOTE) 500 MG DR tablet Take 500 mg by mouth daily.    . DULoxetine (CYMBALTA) 60 MG capsule Take 60 mg by mouth daily.    Marland Kitchen  EPINEPHrine 0.3 mg/0.3 mL IJ SOAJ injection Inject 0.3 mg into the muscle as needed.    Marland Kitchen esomeprazole (NEXIUM) 40 MG capsule 1 PO 30 MINUTES PRIOR TO MEALS BID    . famotidine (PEPCID) 20 MG tablet Take 20 mg by mouth as needed.     . fluticasone (FLONASE) 50 MCG/ACT nasal spray Place 1 spray into both nostrils 2 (two) times daily.    . furosemide (LASIX) 40 MG tablet Take 40 mg by mouth daily.     Marland Kitchen gabapentin (NEURONTIN) 300 MG capsule Take 300 mg by mouth 3 (three) times daily.    Marland Kitchen guaifenesin (ROBITUSSIN) 100 MG/5ML syrup Take 30 mLs by mouth 3 (three) times daily as needed for cough.    Marland Kitchen guaiFENesin-codeine (ROBITUSSIN AC) 100-10 MG/5ML syrup Take 5 mLs by mouth 4 (four) times daily as needed for cough.      Marland Kitchen HYDROcodone-acetaminophen (NORCO) 10-325 MG tablet Take 1 tablet by mouth every 6 (six) hours as needed for moderate pain or severe pain.    . hydrocortisone 2.5 % ointment APPLY 1 APPLICATION 4 TIMES A DAY    . hydrOXYzine (VISTARIL) 25 MG capsule Take 25 mg by mouth daily as needed for itching.    . levocetirizine (XYZAL) 5 MG tablet Take 5 mg by mouth every evening.    . linaclotide (LINZESS) 290 MCG CAPS capsule Take 290 mcg by mouth daily.    . metFORMIN (GLUCOPHAGE) 500 MG tablet Take 500 mg by mouth 2 (two) times daily with a meal.    . metoprolol tartrate (LOPRESSOR) 25 MG tablet Take 25 mg by mouth 2 (two) times daily.     . Olopatadine HCl 0.2 % SOLN Apply 2 drops to eye 2 (two) times daily.    . OMALIZUMAB [place] Inject 150 Units into the skin every 14 (fourteen) days.    . phentermine 37.5 MG capsule Take 37.5 mg by mouth every morning.    . prednisoLONE acetate (PRED FORTE) 1 % ophthalmic suspension Place 1 drop into both eyes 4 (four) times daily.    . QUEtiapine (SEROQUEL) 400 MG tablet Take 800 mg by mouth at bedtime.    . ranitidine (ZANTAC) 150 MG tablet Take 1 tablet by mouth daily.    Marland Kitchen UNABLE TO FIND CPAP at bedtime    . valproic acid (DEPAKENE) 250 MG capsule Take 250 mg by mouth 4 (four) times daily.    .      . nicotine (NICODERM CQ - DOSED IN MG/24 HOURS) 21 mg/24hr patch Place 21 mg onto the skin daily.    Marland Kitchen topiramate (TOPAMAX) 100 MG tablet Take 100 mg by mouth 2 (two) times daily.     Review of Systems PER HPI OTHERWISE ALL SYSTEMS ARE NEGATIVE.    Objective:   Physical Exam  Constitutional: She is oriented to person, place, and time. She appears well-developed and well-nourished. No distress.  HENT:  Head: Normocephalic and atraumatic.  Mouth/Throat: Oropharynx is clear and moist. No oropharyngeal exudate.  Eyes: Pupils are equal, round, and reactive to light. No scleral icterus.  Neck: Normal range of motion. Neck supple.  Cardiovascular: Normal rate,  regular rhythm and normal heart sounds.  Pulmonary/Chest: Effort normal and breath sounds normal. No respiratory distress.  Abdominal: Soft. Bowel sounds are normal. She exhibits no distension. There is tenderness (mild ruq).  Musculoskeletal: She exhibits no edema.  Lymphadenopathy:    She has no cervical adenopathy.  Neurological: She is alert and oriented to person,  place, and time.  NO FOCAL DEFICITS  Psychiatric: She has a normal mood and affect.  Vitals reviewed.     Assessment & Plan:

## 2017-11-07 NOTE — Progress Notes (Signed)
CC'D TO PCP °

## 2017-11-22 ENCOUNTER — Ambulatory Visit: Payer: Self-pay

## 2017-11-22 ENCOUNTER — Ambulatory Visit
Admission: RE | Admit: 2017-11-22 | Discharge: 2017-11-22 | Disposition: A | Discharge status: Home / Self Care | Payer: Managed Care, Other (non HMO) | Source: Ambulatory Visit | Attending: Family Medicine | Admitting: Family Medicine

## 2017-11-22 DIAGNOSIS — Z139 Encounter for screening, unspecified: Secondary | ICD-10-CM

## 2018-02-21 DIAGNOSIS — J069 Acute upper respiratory infection, unspecified: Secondary | ICD-10-CM | POA: Diagnosis not present

## 2018-02-21 DIAGNOSIS — E119 Type 2 diabetes mellitus without complications: Secondary | ICD-10-CM | POA: Diagnosis not present

## 2018-03-22 ENCOUNTER — Encounter: Payer: Self-pay | Admitting: Gastroenterology

## 2018-03-22 IMAGING — DX DG CHEST 2V
2 series · 2 of 2 positions shown · non-contrast
Comparison: Chest radiograph dated 09/19/2016

CLINICAL DATA: 56-year-old female with shortness of breath and
asthma.

EXAM:
CHEST  2 VIEW

[chest lat]
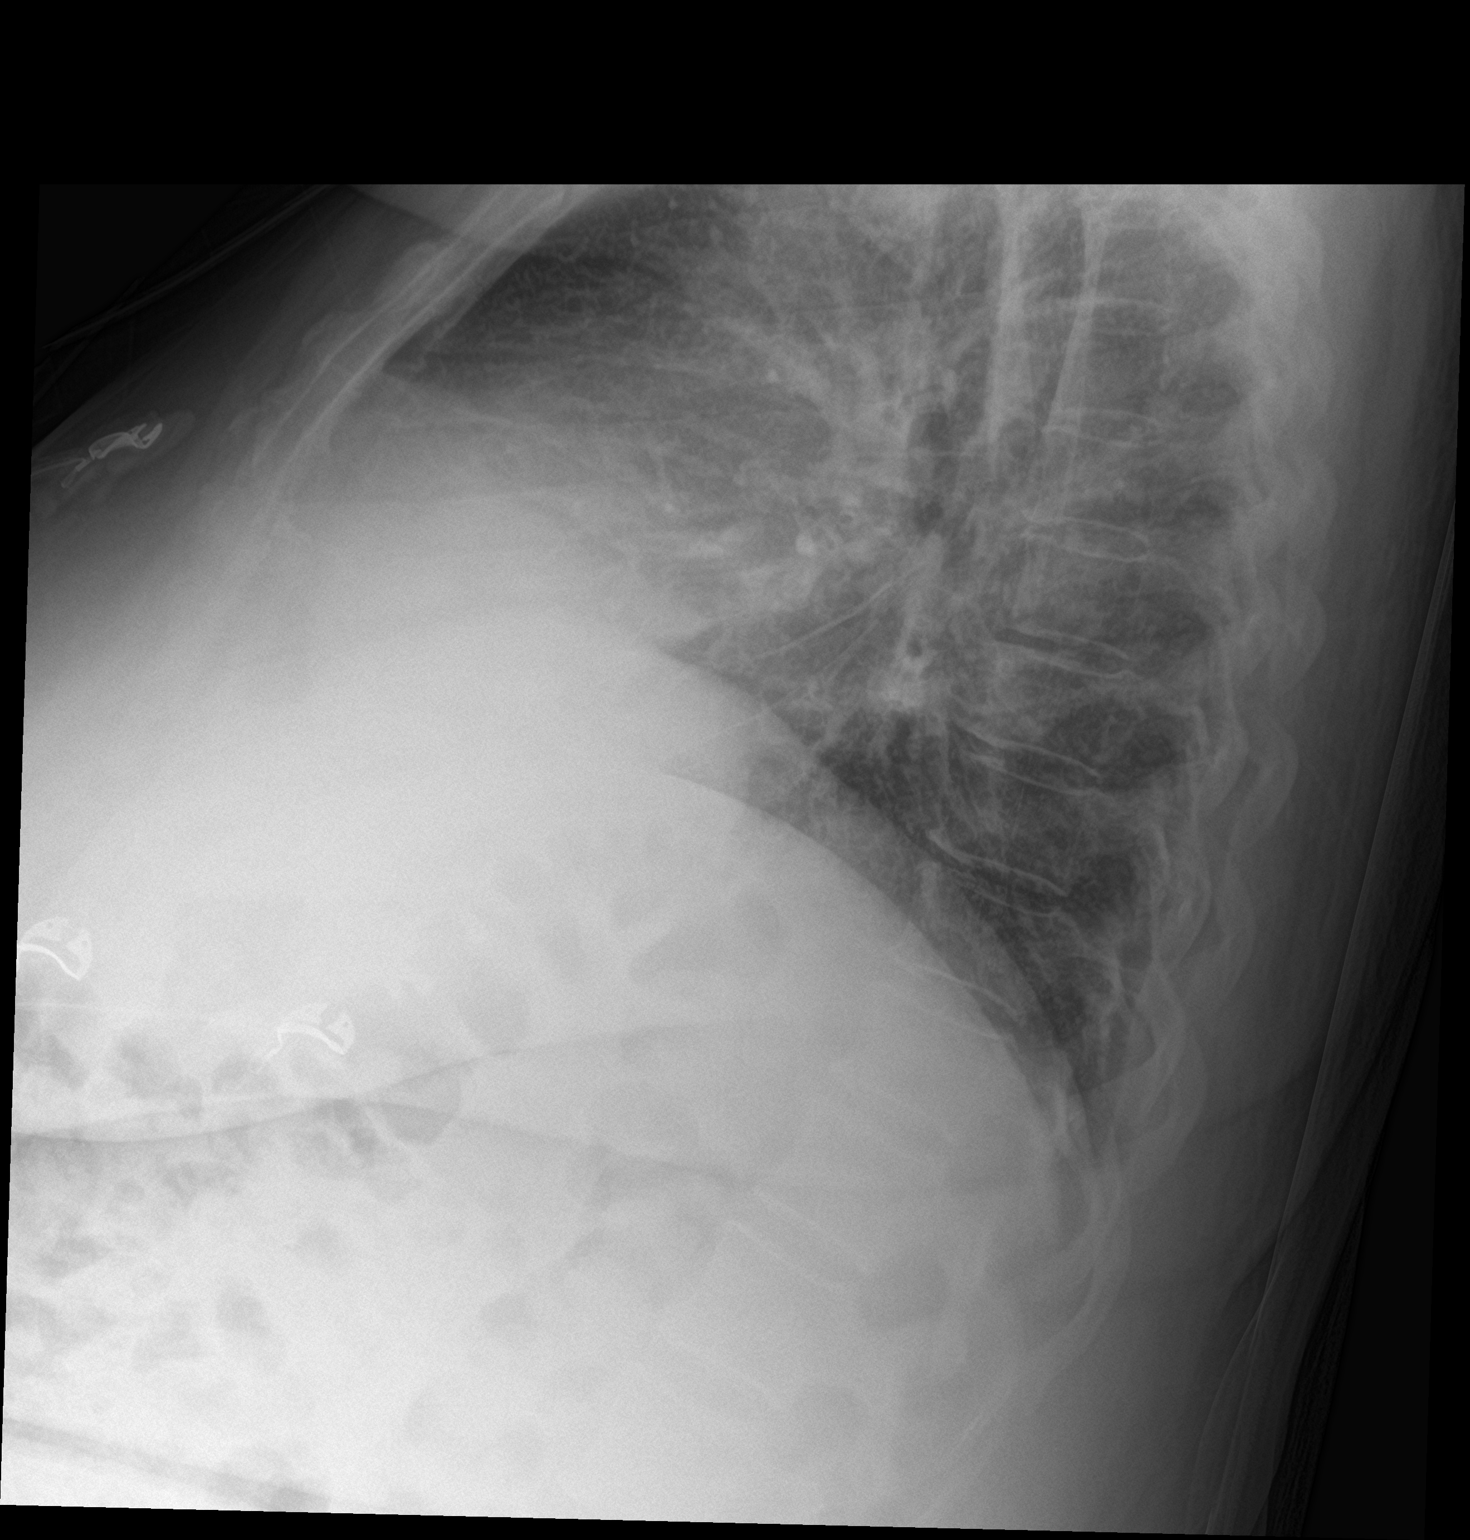

[chest ap]
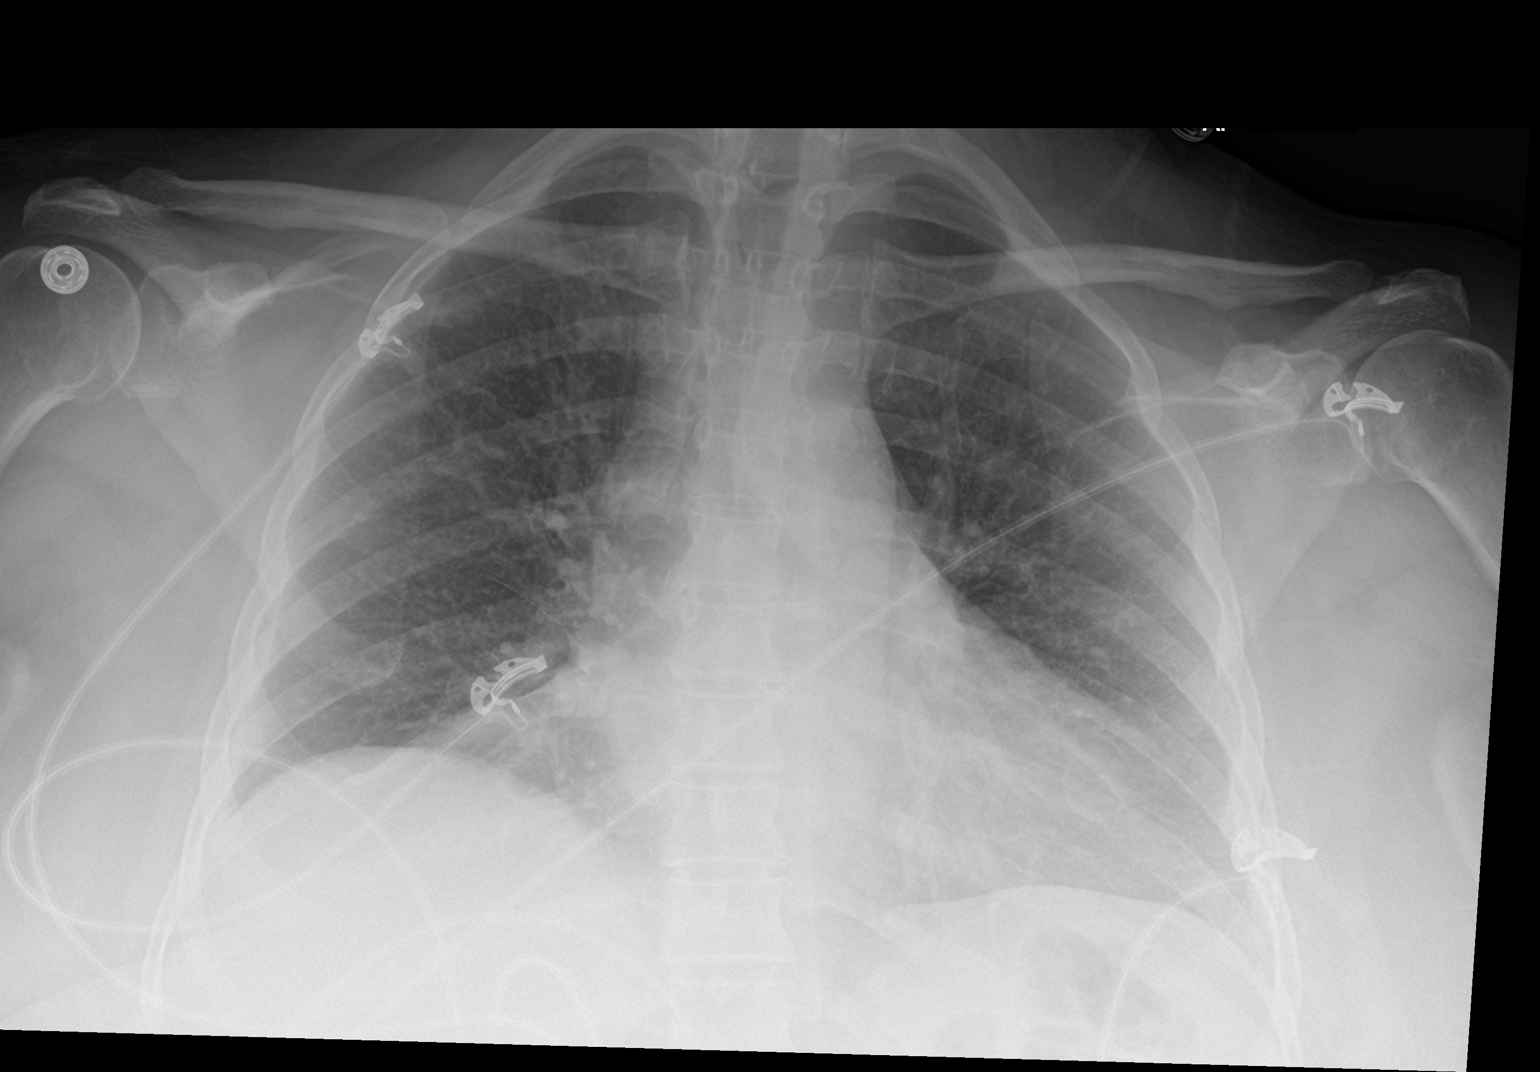

[2 of 2 positions shown; findings below may reference images not displayed]

FINDINGS: There is shallow inspiration with minimal bibasilar atelectasis. No
focal consolidation, pleural effusion, or pneumothorax. The cardiac
silhouette is within normal limits. No acute osseous pathology.
IMPRESSION: No active cardiopulmonary disease.

## 2018-03-22 IMAGING — CT CT NECK W/ CM
3 of 4 series · 13 of 33 positions shown, 16 images · IV contrast (iopamidol)
Comparison: None.

CLINICAL DATA: Initial evaluation for acute shortness of breath,
hoarseness.

EXAM:
CT NECK WITH CONTRAST
TECHNIQUE: Multidetector CT imaging of the neck was performed using the
standard protocol following the bolus administration of intravenous
contrast.
CONTRAST:  50mL V5CPCJ-YSS IOPAMIDOL ([id number]-YSS) INJECTION 61%

[Series 6: coronal neck · coronal · 0.39mm/px · 3 of 121 slices shown]
[im 32/121  bone]
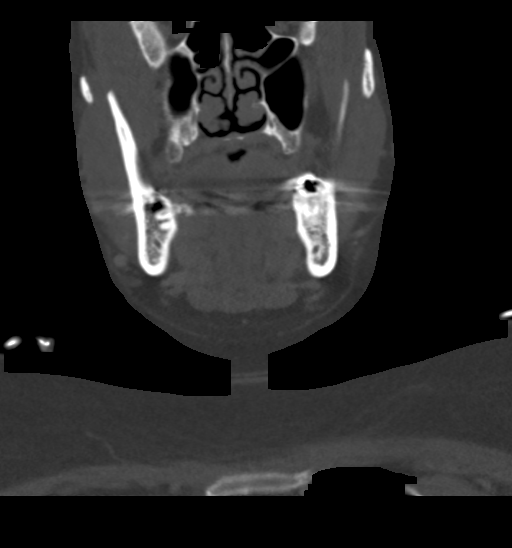
[im 51/121  bone]
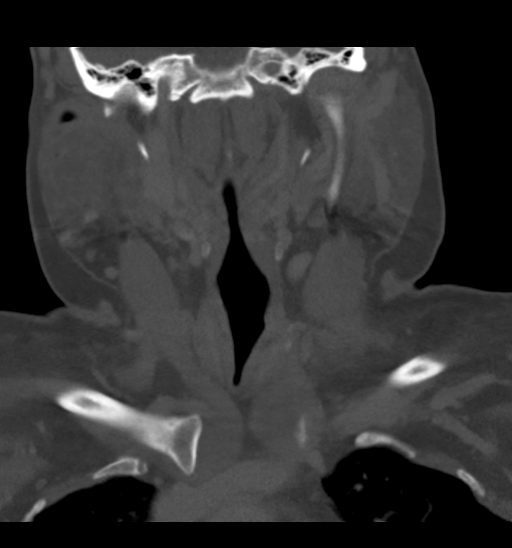
[im 70/121  bone]
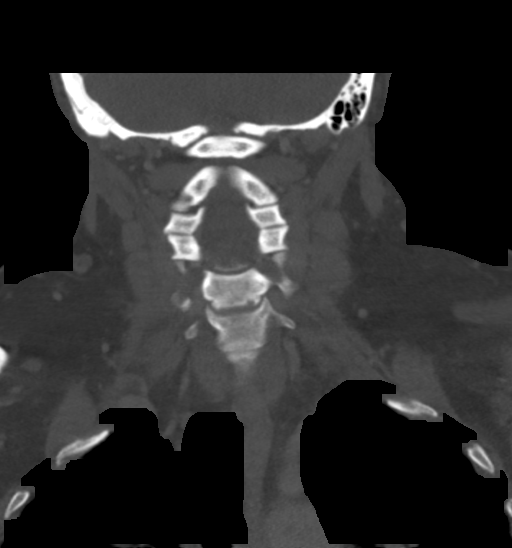

[Series 7: sagittal neck · sagittal · 0.36mm/px · 5 of 111 slices shown, 6 images]
[im 37/111  bone]
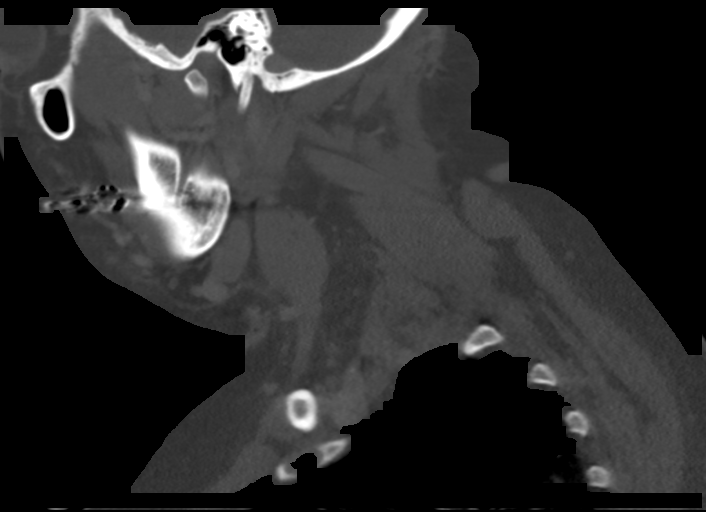
[im 46/111  bone]
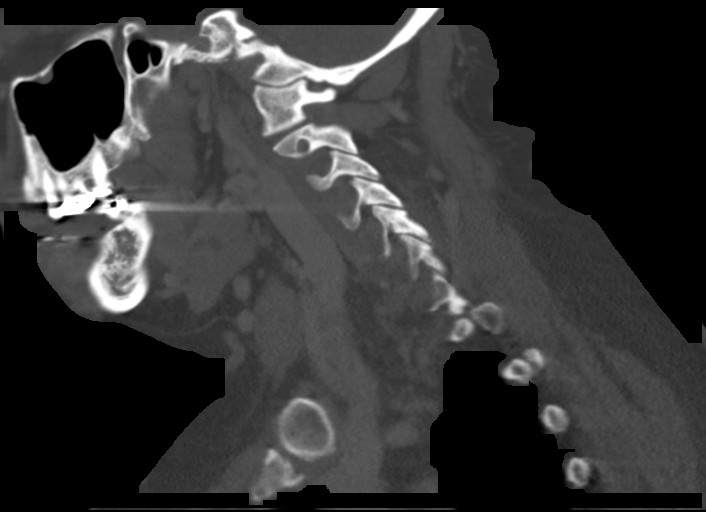
[im 56/111  soft-tissue]
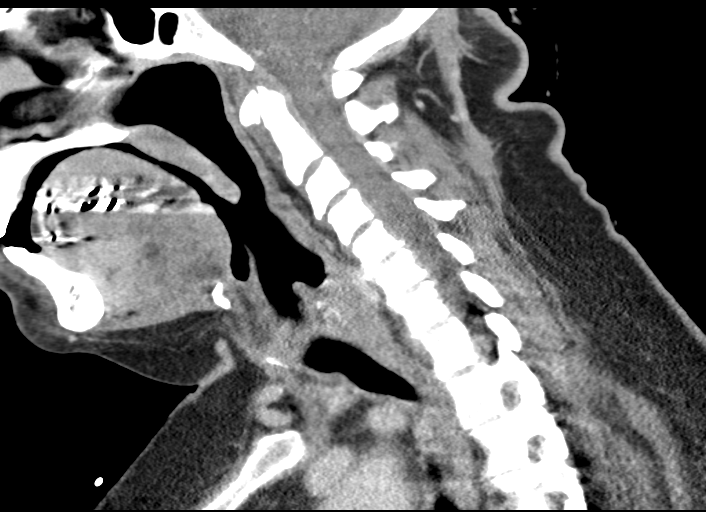
[im 56/111  bone]
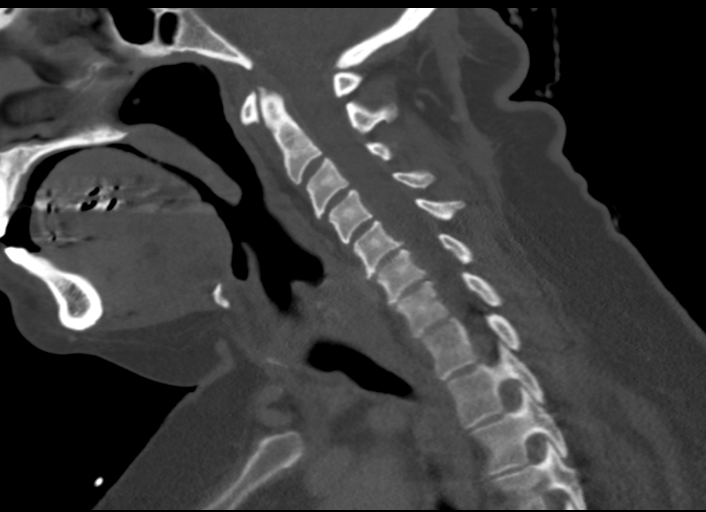
[im 65/111  bone]
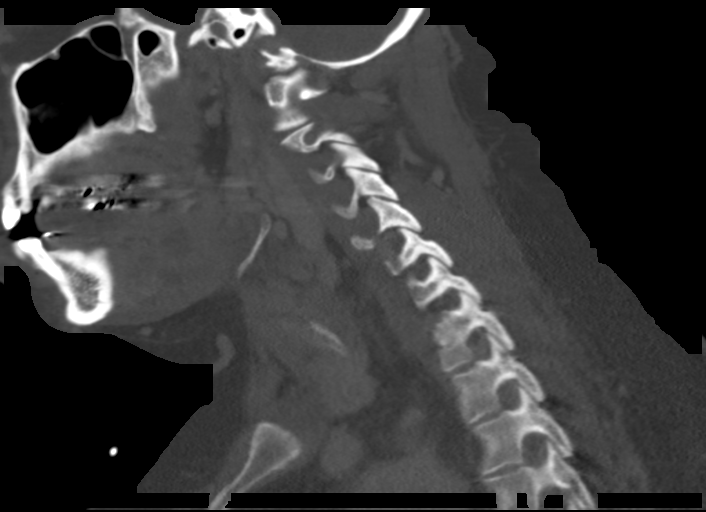
[im 74/111  bone]
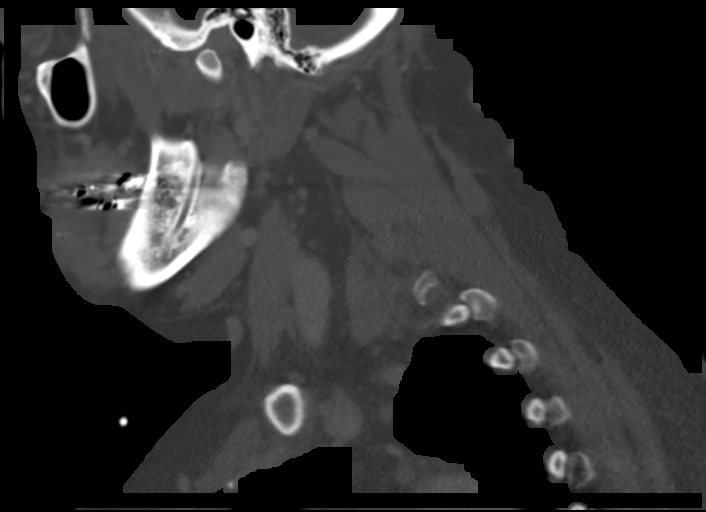

[Series 8: orthogonal ax · axial · 0.39mm/px · z∈[+1392,+1566]mm · 5 of 130 slices shown, 7 images]
[im 19/130  soft-tissue]
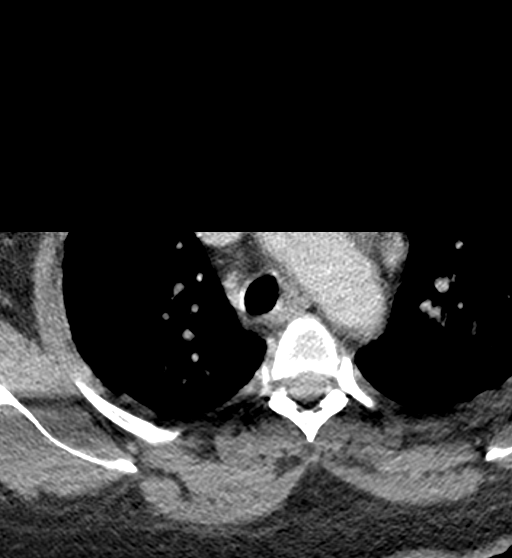
[im 19/130  bone]
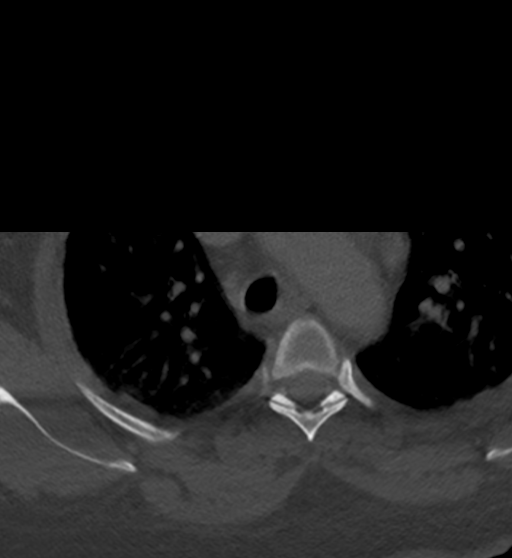
[im 37/130  bone]
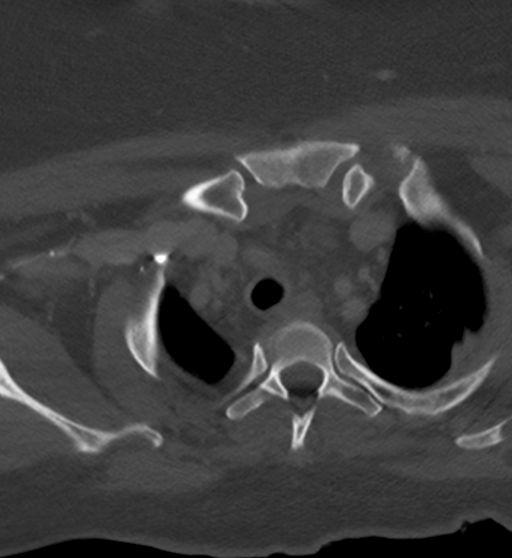
[im 74/130  bone]
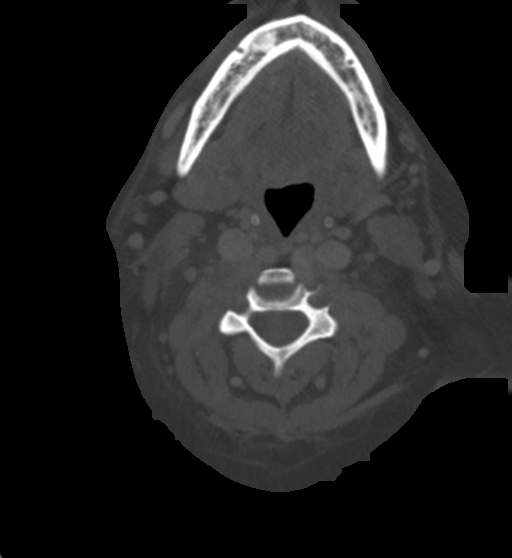
[im 93/130  bone]
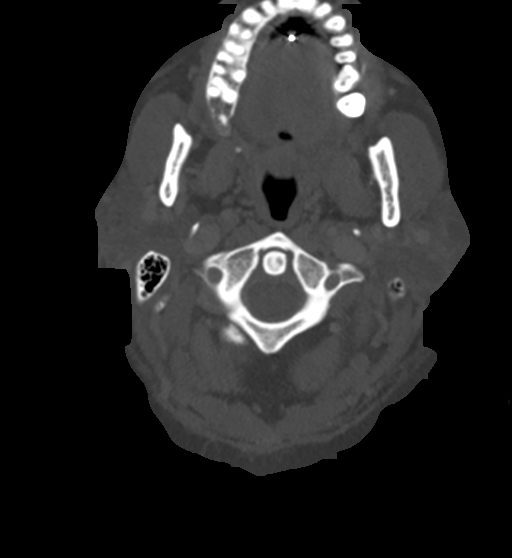
[im 111/130  soft-tissue]
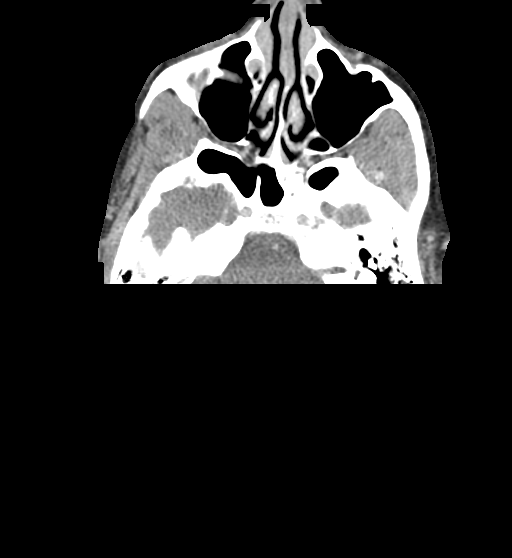
[im 111/130  bone]
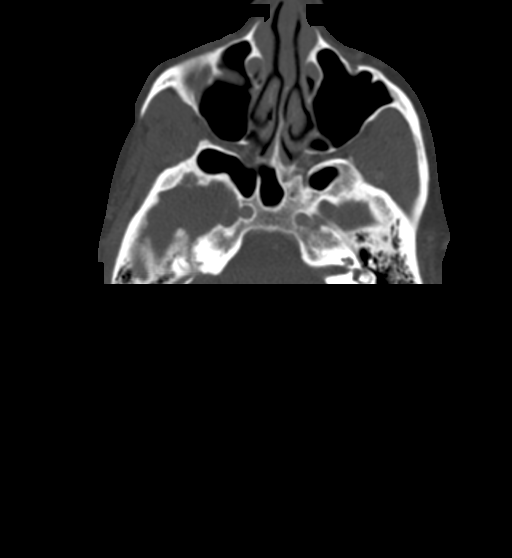

[13 of 33 positions shown; findings below may reference images not displayed]

FINDINGS: Pharynx and larynx: Oral cavity within normal limits without mass
lesion or loculated fluid collection. No acute abnormality about the
visualized dentition. Left hand tonsils symmetric and normal.
Parapharyngeal fat preserved. Nasopharynx within normal limits.
Retropharyngeal soft tissues normal. Epiglottis normal. Vallecula
clear. Remainder of the hypopharynx and supraglottic larynx within
normal limits. True cords symmetric and normal. Subglottic airway
clear.

Salivary glands: Salivary glands including the parotid and and
submandibular glands are normal.

Thyroid: Right lobe of thyroid see pins exterior lesion into the
lower right retropharyngeal soft tissues. Thyroid otherwise
unremarkable.

Lymph nodes: No adenopathy within the neck.

Vascular: Normal intravascular enhancement seen throughout the neck.
Carotid artery's are medialized into the retropharyngeal space
bilaterally.

Limited intracranial: Unremarkable.

Visualized orbits: Visualized globes and orbital soft tissues within
normal limits.

Mastoids and visualized paranasal sinuses: Paranasal sinuses and
mastoid air cells are clear. Middle ear cavities are clear.

Skeleton: No acute osseous abnormality. Mild to moderate
degenerative spondylolysis present at C5-6 and C6-7. No worrisome
lytic or blastic osseous lesions.

Upper chest: Visualized upper chest within normal limits. Partially
visualized lungs are clear.
IMPRESSION: Negative CT of the neck. No acute soft tissue abnormality or
significant inflammatory changes identified.

## 2018-03-24 DIAGNOSIS — F3131 Bipolar disorder, current episode depressed, mild: Secondary | ICD-10-CM | POA: Diagnosis not present

## 2018-06-20 DIAGNOSIS — F3131 Bipolar disorder, current episode depressed, mild: Secondary | ICD-10-CM | POA: Diagnosis not present

## 2018-06-26 ENCOUNTER — Encounter: Payer: Self-pay | Admitting: Cardiology

## 2018-06-26 ENCOUNTER — Ambulatory Visit (INDEPENDENT_AMBULATORY_CARE_PROVIDER_SITE_OTHER): Payer: Medicare Other | Admitting: Cardiology

## 2018-06-26 VITALS — BP 124/86 | HR 76 | Ht 62.0 in | Wt 224.0 lb

## 2018-06-26 DIAGNOSIS — R0602 Shortness of breath: Secondary | ICD-10-CM | POA: Diagnosis not present

## 2018-06-26 DIAGNOSIS — I10 Essential (primary) hypertension: Secondary | ICD-10-CM | POA: Diagnosis not present

## 2018-06-26 DIAGNOSIS — R002 Palpitations: Secondary | ICD-10-CM

## 2018-06-26 NOTE — Progress Notes (Signed)
Electrophysiology Office Note   Date:  06/26/2018   ID:  Kathryn Olson, DOB 1960/11/29, MRN [id number]  PCP:  Kathryn Fear, DO  Primary Electrophysiologist:  Kathryn Haw, MD    No chief complaint on file.    History of Present Illness: Kathryn Olson is a 57 y.o. female who presents today for electrophysiology evaluation.   She was admitted to the hospital in [place] in August with apparently an anaphylactic reaction. At that time she had an echocardiogram and told that she was in congestive heart failure. Prior to that, she says that she was having chest pain.  He had a nuclear stress test performed which showed no evidence of ischemia. She was having palpitations. She wore a 30 day monitor that showed no evidence of tachycardia arrhythmia. She presents today for follow-up.  Today, denies symptoms of palpitations, chest pain, shortness of breath, orthopnea, PND, lower extremity edema, claudication, dizziness, presyncope, syncope, bleeding, or neurologic sequela. The patient is tolerating medications without difficulties.  Overall she is doing well.  She does have some shortness of breath.  She gets short of breath when walking from the parking garage into the office today.  Otherwise she is doing well without major complaint.    Past Medical History:  Diagnosis Date  . Anxiety   . Asthma   . CHF (congestive heart failure) ([place])   . Chronic abdominal pain   . Chronic headaches   . Complication of anesthesia   . Crohn's disease [facility]) June 2013  . Environmental allergies   . Family history of anesthesia complication    MH during C Section  . H/O colonoscopy   . History of electroencephalogram 03/2014   normal  . IBS (irritable bowel syndrome) June 2014  . 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 Migraine   . Obsessive-compulsive disorder   . PTSD (post-traumatic stress disorder)   . Seizures ([place])    Past Surgical History:  Procedure  Laterality Date  . BLADDER SURGERY     age 64  . CESAREAN SECTION     X2  . COLONOSCOPY  05/22/2012   SLF: Polyps, multiple hyperplastic in the sigmoid colon/Polyp in the rectum/  MODERATE Diverticulosis throughout the colon/ Internal hemorrhoids  . ESOPHAGOGASTRODUODENOSCOPY     remote, had ulcers, [place], Dr. Truddie Olson  . ESOPHAGOGASTRODUODENOSCOPY  05/22/2012   SLF: Stricture in the distal esophagus/Moderate gastritis  . PARTIAL HYSTERECTOMY    . TUBAL LIGATION       Current Outpatient Medications  Medication Sig Dispense Refill  . albuterol (PROVENTIL HFA;VENTOLIN HFA) 108 (90 Base) MCG/ACT inhaler Inhale 2 puffs into the lungs every 6 (six) hours as needed for wheezing or shortness of breath.    Marland Kitchen albuterol (PROVENTIL) (5 MG/ML) 0.5% nebulizer solution Take 2.5 mg by nebulization every 6 (six) hours as needed for wheezing or shortness of breath.    . ALPRAZolam (XANAX) 0.5 MG tablet Take 0.5 mg by mouth 3 (three) times daily as needed for anxiety.    Marland Kitchen amitriptyline (ELAVIL) 50 MG tablet Take 1 tablet by mouth daily.    . Bepotastine Besilate 1.5 % SOLN Place 1 drop into both eyes 2 (two) times daily.    . busPIRone (BUSPAR) 15 MG tablet Take 15 mg by mouth as needed.     . diclofenac sodium (VOLTAREN) 1 % GEL Apply 2 g topically 2 (two) times daily. Shoulder pain    . diphenhydrAMINE (  BENADRYL) 25 MG tablet Take 25 mg by mouth every 6 (six) hours as needed for itching or allergies.    Marland Kitchen divalproex (DEPAKOTE) 500 MG DR tablet Take 500 mg by mouth daily.    . DULoxetine (CYMBALTA) 60 MG capsule Take 60 mg by mouth daily.    Marland Kitchen EPINEPHrine 0.3 mg/0.3 mL IJ SOAJ injection Inject 0.3 mg into the muscle as needed.    Marland Kitchen esomeprazole (NEXIUM) 40 MG capsule 1 PO 30 MINUTES PRIOR TO MEALS BID 60 capsule 11  . famotidine (PEPCID) 20 MG tablet Take 20 mg by mouth as needed.     . fluticasone (FLONASE) 50 MCG/ACT nasal spray Place 1 spray into both nostrils 2 (two) times daily.    .  furosemide (LASIX) 40 MG tablet Take 40 mg by mouth daily.     Marland Kitchen gabapentin (NEURONTIN) 300 MG capsule Take 300 mg by mouth 3 (three) times daily.    Marland Kitchen guaifenesin (ROBITUSSIN) 100 MG/5ML syrup Take 30 mLs by mouth 3 (three) times daily as needed for cough.    Marland Kitchen guaiFENesin-codeine (ROBITUSSIN AC) 100-10 MG/5ML syrup Take 5 mLs by mouth 4 (four) times daily as needed for cough.    Marland Kitchen HYDROcodone-acetaminophen (NORCO) 10-325 MG tablet Take 1 tablet by mouth every 6 (six) hours as needed for moderate pain or severe pain.    . hydrocortisone 2.5 % ointment APPLY 1 APPLICATION 4 TIMES A DAY 40 g 1  . hydrOXYzine (VISTARIL) 25 MG capsule Take 25 mg by mouth daily as needed for itching.    . levocetirizine (XYZAL) 5 MG tablet Take 5 mg by mouth every evening.    Marland Kitchen levofloxacin (LEVAQUIN) 500 MG tablet Take 1 tablet (500 mg total) by mouth daily. 7 tablet 0  . linaclotide (LINZESS) 290 MCG CAPS capsule Take 290 mcg by mouth daily.    . metFORMIN (GLUCOPHAGE) 500 MG tablet Take 500 mg by mouth 2 (two) times daily with a meal.    . metoprolol tartrate (LOPRESSOR) 25 MG tablet Take 25 mg by mouth 2 (two) times daily.     . nicotine (NICODERM CQ - DOSED IN MG/24 HOURS) 21 mg/24hr patch Place 21 mg onto the skin daily.    . Olopatadine HCl 0.2 % SOLN Apply 2 drops to eye 2 (two) times daily.    . OMALIZUMAB [place] Inject 150 Units into the skin every 14 (fourteen) days.    . phentermine 37.5 MG capsule Take 37.5 mg by mouth every morning.    . prednisoLONE acetate (PRED FORTE) 1 % ophthalmic suspension Place 1 drop into both eyes 4 (four) times daily.    . QUEtiapine (SEROQUEL) 400 MG tablet Take 800 mg by mouth at bedtime.    . ranitidine (ZANTAC) 150 MG tablet Take 1 tablet by mouth daily.    Marland Kitchen topiramate (TOPAMAX) 100 MG tablet Take 100 mg by mouth 2 (two) times daily.    Marland Kitchen UNABLE TO FIND CPAP at bedtime    . valproic acid (DEPAKENE) 250 MG capsule Take 250 mg by mouth 4 (four) times daily.     No current  facility-administered medications for this visit.     Allergies:   Aspirin; Peanut oil; Peanut-containing drug products; Penicillins; Sulfa antibiotics; Sulfamethoxazole; Adhesive [tape]; Codeine; Latex; and Rubbing alcohol [alcohol]   Social History:  The patient  reports that she quit smoking about 29 years ago. She has never used smokeless tobacco. She reports that she does not drink alcohol or use drugs.  Family History:  The patient's family history includes Alcohol abuse in her father; Anxiety disorder in her sister and sister; Bipolar disorder in her sister; Colon cancer in her maternal grandfather; Dementia in her mother; Depression in her mother and sister; Diabetes in her mother; Heart attack in her father; Heart murmur in her son; Hypertension in her mother; Liver disease in her brother; Malignant hyperthermia in her daughter; OCD in her sister.    ROS:  Please see the history of present illness.   Otherwise, review of systems is positive for weight change, appetite change, sweating, shortness of breath, leg pain, palpitations, facial swelling, visual changes, cough, snoring, wheezing, constipation, nausea, abdominal pain, depression, anxiety, back pain, muscle pain, balance problems, headache, dizziness, easy bruising.   All other systems are reviewed and negative.   PHYSICAL EXAM: VS:  BP 124/86   Pulse 76   Ht 5' 2"  (1.575 m)   Wt 224 lb (101.6 kg)   BMI 40.97 kg/m  , BMI Body mass index is 40.97 kg/m. GEN: Well nourished, well developed, in no acute distress  HEENT: normal  Neck: no JVD, carotid bruits, or masses Cardiac: RRR; no murmurs, rubs, or gallops,no edema  Respiratory:  clear to auscultation bilaterally, normal work of breathing GI: soft, nontender, nondistended, + BS MS: no deformity or atrophy  Skin: warm and dry Neuro:  Strength and sensation are intact Psych: euthymic mood, full affect  EKG:  EKG is ordered today. Personal review of the ekg ordered shows  sinus rhythm, rate 75  Recent Labs: No results found for requested labs within last 8760 hours.    Lipid Panel     Component Value Date/Time   CHOL 203 (H) 09/04/2013 1728   TRIG 185 (H) 09/04/2013 1728   HDL 59 09/04/2013 1728   LDLCALC 107 (H) 09/04/2013 1728     Wt Readings from Last 3 Encounters:  06/26/18 224 lb (101.6 kg)  11/03/17 217 lb 12.8 oz (98.8 kg)  06/29/17 218 lb 12.8 oz (99.2 kg)      Other studies Reviewed: Additional studies/ records that were reviewed today include: Myoview 01/05/17  Nuclear stress EF: 55%.  There was no ST segment deviation noted during stress.  The study is normal.  The left ventricular ejection fraction is normal (55-65%).   1. EF 55%, normal wall motion.  2. No significant perfusion defect, no evidence for ischemia or infarction.  3. Normal study.    ASSESSMENT AND PLAN:  1.  Hypertension: Currently well controlled.  No changes.  2. Chest pain: Nuclear stress test without evidence of ischemia.  No current chest pain.  3. Palpitations: Monitor showed no evidence of arrhythmias.  No changes.  4. Reported congestive heart failure: Is having shortness of breath.  Her ejection fraction was normal on her most recent echo.  She does have a reported history of heart failure.  Will order a BNP today.  Current medicines are reviewed at length with the patient today.   The patient does not have concerns regarding her medicines.  The following changes were made today: None  Labs/ tests ordered today include:  Orders Placed This Encounter  Procedures  . Pro b natriuretic peptide (BNP)  . EKG 12-Lead     Disposition:   FU with Will Camnitz 12 month  Signed, Will Meredith Leeds, MD  06/26/2018 1:39 PM     [address] [place] [address] [phone] (office) [phone] (fax)

## 2018-06-26 NOTE — Patient Instructions (Addendum)
Medication Instructions:  Your physician recommends that you continue on your current medications as directed. Please refer to the Current Medication list given to you today.  * If you need a refill on your cardiac medications before your next appointment, please call your pharmacy.   Labwork: BNP today *We will only notify you of abnormal results, otherwise continue current treatment plan.  Testing/Procedures: None ordered  Follow-Up: Your physician wants you to follow-up in: 6 months with Dr. Curt Bears.  You will receive a reminder letter in the mail two months in advance. If you don't receive a letter, please call our office to schedule the follow-up appointment.   Thank you for choosing [facility]!!   Trinidad Curet, RN [phone]

## 2018-06-27 LAB — PRO B NATRIURETIC PEPTIDE: NT-PRO BNP: 24 pg/mL (ref 0–287)

## 2018-07-13 ENCOUNTER — Other Ambulatory Visit: Payer: Self-pay | Admitting: *Deleted

## 2018-07-13 ENCOUNTER — Ambulatory Visit (INDEPENDENT_AMBULATORY_CARE_PROVIDER_SITE_OTHER): Payer: Medicare Other | Admitting: Gastroenterology

## 2018-07-13 ENCOUNTER — Encounter: Payer: Self-pay | Admitting: Gastroenterology

## 2018-07-13 DIAGNOSIS — K219 Gastro-esophageal reflux disease without esophagitis: Secondary | ICD-10-CM

## 2018-07-13 DIAGNOSIS — E1369 Other specified diabetes mellitus with other specified complication: Secondary | ICD-10-CM

## 2018-07-13 DIAGNOSIS — K5010 Crohn's disease of large intestine without complications: Secondary | ICD-10-CM | POA: Diagnosis not present

## 2018-07-13 MED ORDER — ESOMEPRAZOLE MAGNESIUM 40 MG PO CPDR: DELAYED_RELEASE_CAPSULE | ORAL | 11 refills | Status: DC

## 2018-07-13 NOTE — Assessment & Plan Note (Signed)
SYMPTOMS NOT IDEALLY CONTROLLED WITH BMI > 40 AND 8 LB WEIGHT GAIN SINCE JAN 2019 AND PRN NEXIUM, AND DIETARY NONADHERENT.  DRINK WATER TO KEEP YOUR URINE LIGHT YELLOW. CONTINUE YOUR WEIGHT LOSS EFFORTS. YOUR BODY MASS INDEX IS OVER 40 WHICH MEANS YOU ARE MORBIDLY OBESE. OBESITY IS ASSOCIATED WITH AN INCREASED FOR CIRRHOSIS AND ALL CANCERS, INCLUDING ESOPHAGEAL AND COLON CANCER. A WEIGHT OF 215 LBS OR LESS WILL GET YOUR BODY MASS INDEX(BMI) UNDER 40 BUT YOUR BODY MASS INDEX WILL STILL BE OVER 30 WHICH MEANS YOU ARE OBESE.  A WEIGHT OF 160 LBS OR LESS  WILL GET YOUR BODY MASS INDEX(BMI) UNDER 30.  AVOID REFLUX TRIGGERS.  HANDOUT GIVEN. CONTINUE NEXIUM. TAKE 30 MINUTES BEFORE MEALS TWICE DAILY FOR 2 WEEK STHEN ONCE DAILY. FOLLOW UP IN 4 MOS.  DISCUSSED PROCEDURE, BENEFITS, AND RISKS OF NOT LOSING WEIGHT/GERD TO INCLUDE PREMATURE DEATH. PT VOICED HER UNDERSTANDING.

## 2018-07-13 NOTE — Progress Notes (Signed)
Subjective:    Patient ID: Kathryn Olson, female    DOB: 1961/02/14, 57 y.o.   MRN: [id number]  Emelda Fear, DO   HPI Wonders about her reflux. PUT HERSELF ON A DIET PILL. DOESN'T EAT A LOT. COFFEE: NO SUGAR. EATS A SMALL BREAKFAST: NO LUNCH. CAN'T TOLERATE APPLES OR CHEESE(DIARRHEA, CHEESE). EATS BLUEBERRIES, BLACK BERRIES, AND PINEAPPLE.  RARE CONSTIPATED. COULDN'T CATCH BREATH AND NEEDED TO HAVE THE [place]. BELCHES A LOT.INTERMITTENT PERIUMBILICAL ABDOMINAL PAIN(SHARP, LASTS 5 MINS) HEARTBURN: UNCONTROLLED 2X/WEEK. REGURGITATION HAPPENING: 3X/WEEK WHEN SHE DOES NOT COOK. BMs: Q2-3 DAYS, NL PATTERN: ONCE A WEEK. HAS PAIN IN LEFT NECK.  PT DENIES FEVER, CHILLS, HEMATOCHEZIA, HEMATEMESIS, nausea, vomiting, melena, CHEST PAIN, SHORTNESS OF BREATH, CHANGE IN BOWEL IN HABITS, abdominal pain, OR problems swallowing.  Past Medical History:  Diagnosis Date  . Anxiety   . Asthma   . CHF (congestive heart failure) ([place])   . Chronic abdominal pain   . Chronic headaches   . Complication of anesthesia   . Crohn's disease [facility]) June 2013  . Environmental allergies   . Family history of anesthesia complication    MH during C Section  . H/O colonoscopy   . History of electroencephalogram 03/2014   normal  . IBS (irritable bowel syndrome) June 2014  . 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 Migraine   . Obsessive-compulsive disorder   . PTSD (post-traumatic stress disorder)   . Seizures ([place])    Past Surgical History:  Procedure Laterality Date  . BLADDER SURGERY     age 38  . CESAREAN SECTION     X2  . COLONOSCOPY  05/22/2012   SLF: Polyps, multiple hyperplastic in the sigmoid colon/Polyp in the rectum/  MODERATE Diverticulosis throughout the colon/ Internal hemorrhoids  . ESOPHAGOGASTRODUODENOSCOPY     remote, had ulcers, [place], Dr. Truddie Coco  . ESOPHAGOGASTRODUODENOSCOPY  05/22/2012   SLF: Stricture in the distal esophagus/Moderate gastritis   . PARTIAL HYSTERECTOMY    . TUBAL LIGATION     Allergies  Allergen Reactions  . Aspirin Anaphylaxis and Swelling    Other reaction(s): Other (See Comments)  . Peanut Oil Swelling  . Peanut-Containing Drug Products Anaphylaxis       . Penicillins Anaphylaxis      . Sulfa Antibiotics Anaphylaxis  . Sulfamethoxazole Itching  . Adhesive [Tape] Rash  . Codeine Itching and Rash  . Latex Rash  . Rubbing Alcohol [Alcohol] Rash    Current Outpatient Medications  Medication Sig    . albuterol (PROVENTIL HFA;VENTOLIN HFA) 108 (90 Base) MCG/ACT inhaler Inhale 2 puffs into the lungs every 6 (six) hours as needed for wheezing or shortness of breath.    Marland Kitchen albuterol (PROVENTIL) (5 MG/ML) 0.5% nebulizer solution Take 2.5 mg by nebulization every 6 (six) hours as needed for wheezing or shortness of breath.    . ALPRAZolam (XANAX) 0.5 MG tablet Take 0.5 mg by mouth 3 (three) times daily as needed for anxiety.    Marland Kitchen amitriptyline (ELAVIL) 50 MG tablet Take 1 tablet by mouth daily.    . Bepotastine Besilate 1.5 % SOLN Place 1 drop into both eyes 2 (two) times daily.    . busPIRone (BUSPAR) 15 MG tablet Take 15 mg by mouth as needed.     . diphenhydrAMINE (BENADRYL) 25 MG tablet Take 25 mg by mouth every 6 (six) hours as needed for itching or allergies.    Marland Kitchen divalproex (DEPAKOTE) 500 MG  DR tablet Take 500 mg by mouth daily.    . DULoxetine (CYMBALTA) 60 MG capsule Take 60 mg by mouth daily.    Marland Kitchen EPINEPHrine 0.3 mg/0.3 mL IJ SOAJ injection Inject 0.3 mg into the muscle as needed.    Marland Kitchen esomeprazole (NEXIUM) 40 MG capsule 1 PO 30 MINUTES PRIOR TO MEALS BID PRN   . famotidine (PEPCID) 20 MG tablet Take 20 mg by mouth as needed.     . furosemide (LASIX) 40 MG tablet Take 40 mg by mouth daily.     Marland Kitchen gabapentin (NEURONTIN) 300 MG capsule Take 300 mg by mouth 3 (three) times daily.    Marland Kitchen guaifenesin (ROBITUSSIN) 100 MG/5ML syrup Take 30 mLs by mouth 3 (three) times daily as needed for cough.    Marland Kitchen  guaiFENesin-codeine (ROBITUSSIN AC) 100-10 MG/5ML syrup Take 5 mLs by mouth 4 (four) times daily as needed for cough.    Marland Kitchen HYDROcodone-acetaminophen (NORCO) 10-325 MG tablet Take 1 tablet by mouth every 6 (six) hours as needed for moderate pain or severe pain.    . hydrOXYzine (VISTARIL) 25 MG capsule Take 25 mg by mouth daily as needed for itching.    . levocetirizine (XYZAL) 5 MG tablet Take 5 mg by mouth every evening.    . metFORMIN (GLUCOPHAGE) 500 MG tablet Take 500 mg by mouth as needed.     . metoprolol tartrate (LOPRESSOR) 25 MG tablet Take 25 mg by mouth 2 (two) times daily.     . Olopatadine HCl 0.2 % SOLN Apply 2 drops to eye 2 (two) times daily.    .      . prednisoLONE acetate (PRED FORTE) 1 % ophthalmic suspension Place 1 drop into both eyes 4 (four) times daily.    . QUEtiapine (SEROQUEL) 400 MG tablet Take 800 mg by mouth at bedtime.    . ranitidine (ZANTAC) 150 MG tablet Take 1 tablet by mouth daily.    Marland Kitchen topiramate (TOPAMAX) 100 MG tablet Take 100 mg by mouth 2 (two) times daily.    Marland Kitchen UNABLE TO FIND CPAP at bedtime    . valproic acid (DEPAKENE) 250 MG capsule Take 250 mg by mouth 4 (four) times daily.    . diclofenac sodium (VOLTAREN) 1 % GEL Apply 2 g topically 2 (two) times daily. Shoulder pain    . fluticasone (FLONASE) 50 MCG/ACT nasal spray Place 1 spray into both nostrils 2 (two) times daily.    .      .      . linaclotide (LINZESS) 290 MCG CAPS capsule Take 290 mcg by mouth daily.    . nicotine (NICODERM CQ - DOSED IN MG/24 HOURS) 21 mg/24hr patch Place 21 mg onto the skin daily.    . OMALIZUMAB [place] Inject 150 Units into the skin every 14 (fourteen) days.     Review of Systems PER HPI OTHERWISE ALL SYSTEMS ARE NEGATIVE.    Objective:   Physical Exam  Constitutional: She is oriented to person, place, and time. She appears well-developed and well-nourished. No distress.  HENT:  Head: Normocephalic and atraumatic.  Mouth/Throat: Oropharynx is clear and moist. No  oropharyngeal exudate.  Eyes: Pupils are equal, round, and reactive to light. No scleral icterus.  Neck: Normal range of motion. Neck supple.  Cardiovascular: Normal rate, regular rhythm and normal heart sounds.  Pulmonary/Chest: Effort normal and breath sounds normal. No respiratory distress.  Abdominal: Soft. Bowel sounds are normal. She exhibits no distension. There is no tenderness.  Musculoskeletal:  She exhibits no edema.  Lymphadenopathy:    She has no cervical adenopathy.  Neurological: She is alert and oriented to person, place, and time.  NO  NEW FOCAL DEFICITS  Psychiatric:  ANXIOUS MOOD, normal AFFECT  Vitals reviewed.     Assessment & Plan:

## 2018-07-13 NOTE — Patient Instructions (Addendum)
DRINK WATER TO KEEP YOUR URINE LIGHT YELLOW.  CONTINUE YOUR WEIGHT LOSS EFFORTS. YOUR BODY MASS INDEX IS OVER 40 WHICH MEANS YOU ARE MORBIDLY OBESE. OBESITY IS ASSOCIATED WITH AN INCREASED FOR CIRRHOSIS AND ALL CANCERS, INCLUDING ESOPHAGEAL AND COLON CANCER. A WEIGHT OF 215 LBS OR LESS WILL GET YOUR BODY MASS INDEX(BMI) UNDER 40 BUT YOUR BODY MASS INDEX WILL STILL BE OVER 30 WHICH MEANS YOU ARE OBESE.  A WEIGHT OF 160 LBS OR LESS  WILL GET YOUR BODY MASS INDEX(BMI) UNDER 30.   AVOID REFLUX TRIGGERS. SEE INFO BELOW.  CONTINUE NEXIUM. TAKE 30 MINUTES BEFORE MEALS TWICE DAILY FOR 2 WEEK STHEN ONCE DAILY.   RE-START LINZESS TO AVOID CONSTIPATION.  FOLLOW UP IN 4 MOS.    Lifestyle and home remedies TO CONTROL HEARTBURN/REGURGITATION You may eliminate or reduce the frequency of heartburn by making the following lifestyle changes:  . Control your weight. Being overweight is a major risk factor for heartburn and GERD. Excess pounds put pressure on your abdomen, pushing up your stomach and causing acid to back up into your esophagus.   . Eat smaller meals. 4 TO 6 MEALS A DAY. This reduces pressure on the lower esophageal sphincter, helping to prevent the valve from opening and acid from washing back into your esophagus.   Dolphus Jenny your belt. Clothes that fit tightly around your waist put pressure on your abdomen and the lower esophageal sphincter.   . Eliminate heartburn triggers. Everyone has specific triggers. Common triggers such as fatty or fried foods, spicy food, tomato sauce, carbonated beverages, alcohol, chocolate, mint, garlic, onion, caffeine and nicotine may make heartburn worse.   Marland Kitchen Avoid stooping or bending. Tying your shoes is OK. Bending over for longer periods to weed your garden isn't, especially soon after eating.   . Don't lie down after a meal. Wait at least three to four hours after eating before going to bed, and don't lie down right after eating.   Alternative  medicine . Several home remedies exist for treating GERD, but they provide only temporary relief. They include drinking baking soda (sodium bicarbonate) added to water or drinking other fluids such as baking soda mixed with cream of tartar and water. . Although these liquids create temporary relief by neutralizing, washing away or buffering acids, eventually they aggravate the situation by adding gas and fluid to your stomach, increasing pressure and causing more acid reflux. Further, adding more sodium to your diet may increase your blood pressure and add stress to your heart, and excessive bicarbonate ingestion can alter the acid-base balance in your body.

## 2018-07-13 NOTE — Assessment & Plan Note (Signed)
SYMPTOMS CONTROLLED/RESOLVED.  CONTINUE TO MONITOR SYMPTOMS. 

## 2018-07-14 NOTE — Progress Notes (Signed)
CC'D TO PCP °

## 2018-07-14 NOTE — Progress Notes (Signed)
ON RECALL  °

## 2018-09-07 ENCOUNTER — Encounter: Payer: Medicare Other | Attending: Gastroenterology | Admitting: Nutrition

## 2018-09-07 VITALS — Ht 62.0 in | Wt 223.0 lb

## 2018-09-07 DIAGNOSIS — E1369 Other specified diabetes mellitus with other specified complication: Secondary | ICD-10-CM | POA: Insufficient documentation

## 2018-09-07 DIAGNOSIS — IMO0002 Reserved for concepts with insufficient information to code with codable children: Secondary | ICD-10-CM

## 2018-09-07 DIAGNOSIS — E782 Mixed hyperlipidemia: Secondary | ICD-10-CM

## 2018-09-07 DIAGNOSIS — E1165 Type 2 diabetes mellitus with hyperglycemia: Secondary | ICD-10-CM

## 2018-09-07 DIAGNOSIS — E118 Type 2 diabetes mellitus with unspecified complications: Secondary | ICD-10-CM

## 2018-09-07 NOTE — Patient Instructions (Signed)
Goals 1. Follow My Plate 2. Exercise 30 minutes 3-4 times per week. 3. Lose 1 lb per week.  4. Testing blood sugars twice a day.

## 2018-09-07 NOTE — Progress Notes (Signed)
Medical Nutrition Therapy:  Appt start time: 1100 end time:  1215.   Assessment:  Primary concerns today: Diabetes Type 2. PMH: Hyperlipidemia, HTN, Obesity and CHF. LIves with her husband. Husband does the cooking and shopping. Eats 2-3 times per day.  On disability. Has multiple medical problems. Suffers from Depression and is on medications but feels she needs more help with treatment. Shes sees therapist once 1 every 3 months. Wants weekly visits to help her through her depression issues. Willing to do Skype or on line sessions. Very tearful today. Metformin 500 mg BID.  Testing blood sugars some but not a lot recently. Last A1C was 8.2% per PCP office. Has gastroparesis and complains of not being hungry at times. Sleeps in late and stays up late. Not physically active. Wants to lose weight. Says she has gained about 10 lbs in the last year.  Last A1C was 8.3% Sept 2018. Has appt soon  with PCP for follow up.  She notes she has a lot of medical issues.  Current diet exceeds her nutritional needs contributing to elevated blood sugars, weight gain and hyperlipidemia. Wt Readings from Last 3 Encounters:  09/07/18 223 lb (101.2 kg)  07/13/18 225 lb (102.1 kg)  06/26/18 224 lb (101.6 kg)   Ht Readings from Last 3 Encounters:  09/07/18 5' 2"  (1.575 m)  07/13/18 5' 2"  (1.575 m)  06/26/18 5' 2"  (1.575 m)   Body mass index is 40.79 kg/m. @BMIFA @ Facility age limit for growth percentiles is 20 years. Facility age limit for growth percentiles is 20 years. CMP Latest Ref Rng & Units 05/17/2017 09/19/2016 07/28/2014  Glucose 65 - 99 mg/dL 239(H) 190(H) 127(H)  BUN 6 - 20 mg/dL 18 13 13   Creatinine 0.44 - 1.00 mg/dL 0.84 0.80 0.75  Sodium 135 - 145 mmol/L 140 140 143  Potassium 3.5 - 5.1 mmol/L 4.4 4.0 3.9  Chloride 101 - 111 mmol/L 108 104 108  CO2 22 - 32 mmol/L 25 - 25  Calcium 8.9 - 10.3 mg/dL 9.6 - 8.8  Total Protein 6.0 - 8.3 g/dL - - -  Total Bilirubin 0.3 - 1.2 mg/dL - - -   Alkaline Phos 39 - 117 U/L - - -  AST 0 - 37 U/L - - -  ALT 0 - 35 U/L - - -   Lipid Panel     Component Value Date/Time   CHOL 203 (H) 09/04/2013 1728   TRIG 185 (H) 09/04/2013 1728   HDL 59 09/04/2013 1728   LDLCALC 107 (H) 09/04/2013 1728     Preferred Learning Style:   Hands on  No preference indicated   Learning Readiness:   Ready  Change in progress   MEDICATIONS:  DIETARY INTAKE:  24-hr recall:  B ( AM):  2 boiled eggs, 2 slice toast, juice 4 oz and coffee  Snk ( AM):   L ( PM): bean and bacon soup [place],  1 whole can, Water  Snk ( PM):  D ( PM): Ribs, mac/cheese,  Water  Snk ( PM): Beverages: water  Usual physical activity: A  Estimated energy needs: 1200  calories 125 g carbohydrates 90 g protein 33 g fat  Progress Towards Goal(s):  In progress.   Nutritional Diagnosis:  NB-1.1 Food and nutrition-related knowledge deficit As related to Diabetes.  As evidenced by A1C 8.3%.    Intervention:  Nutrition and Diabetes education provided on My Plate, CHO counting, meal planning, portion sizes, timing of meals, avoiding snacks between meals  unless having a low blood sugar, target ranges for A1C and blood sugars, signs/symptoms and treatment of hyper/hypoglycemia, monitoring blood sugars, taking medications as prescribed, benefits of exercising 30 minutes per day and prevention of complications of DM. Goals 1. Follow My Plate 2. Exercise 30 minutes 3-4 times per week. 3. Lose 1 lb per week.  4. Testing blood sugars twice a day.  Teaching Method Utilized:  Visual Auditory Hands on  Handouts given during visit include:  The Plate Method   Meal Plan   Barriers to learning/adherence to lifestyle change: none  Demonstrated degree of understanding via:  Teach Back   Monitoring/Evaluation:  Dietary intake, exercise, meal planning, and body weight in 1 month(s).

## 2018-09-11 ENCOUNTER — Encounter: Payer: Self-pay | Admitting: Emergency Medicine

## 2018-09-11 DIAGNOSIS — F411 Generalized anxiety disorder: Secondary | ICD-10-CM

## 2018-09-11 DIAGNOSIS — F319 Bipolar disorder, unspecified: Secondary | ICD-10-CM | POA: Insufficient documentation

## 2018-09-12 ENCOUNTER — Encounter: Payer: Self-pay | Admitting: Nutrition

## 2018-09-22 ENCOUNTER — Ambulatory Visit (INDEPENDENT_AMBULATORY_CARE_PROVIDER_SITE_OTHER): Payer: Medicare Other | Admitting: Physician Assistant

## 2018-09-22 ENCOUNTER — Encounter: Payer: Self-pay | Admitting: Physician Assistant

## 2018-09-22 DIAGNOSIS — F3175 Bipolar disorder, in partial remission, most recent episode depressed: Secondary | ICD-10-CM

## 2018-09-22 DIAGNOSIS — F419 Anxiety disorder, unspecified: Secondary | ICD-10-CM | POA: Diagnosis not present

## 2018-09-22 MED ORDER — ALPRAZOLAM 0.5 MG PO TABS: 0.5000 mg | ORAL_TABLET | Freq: Two times a day (BID) | ORAL | 5 refills | Status: DC | PRN

## 2018-09-22 MED ORDER — QUETIAPINE FUMARATE 400 MG PO TABS: 800.0000 mg | ORAL_TABLET | Freq: Every day | ORAL | 5 refills | Status: DC

## 2018-09-22 MED ORDER — SERTRALINE HCL 100 MG PO TABS: 100.0000 mg | ORAL_TABLET | Freq: Every day | ORAL | 5 refills | Status: DC

## 2018-09-22 NOTE — Progress Notes (Signed)
[facility]  Patient ID: Kathryn Olson,  MRN: [id number]  PCP: Emelda Fear, DO  Date of Evaluation: 09/22/2018 Time spent:15 minutes  Chief Complaint:  Chief Complaint    Follow-up      HISTORY/CURRENT STATUS: HPI Here for 3 month med check.  Overall, doing well. She hasn't been able to see her mother since August.  (See previous notes) Upsets her a lot, but tries to deal with it the best she can.    She stays home a lot, mostly to keep from being exposed to germs.  She's able to enjoy things, energy and motivation are pretty good.   Patient denies increased energy with decreased need for sleep, no increased talkativeness, no racing thoughts, no impulsivity or risky behaviors, no increased spending, no increased libido, no grandiosity.  Because of multiple physical health problems, she's not able to do as much as she'd like. She had to go to ER over Thanksgiving due to chest pain.  She had a chest wall strain and had to be in the bed for awhile.  But is better now.    Individual Medical History/ Review of Systems: Changes? :No    Past medications for mental health diagnoses include: Equetro, Seroquel, VPA, Xanax, Buspar  Allergies: Aspirin; Peanut oil; Peanut-containing drug products; Penicillins; Sulfa antibiotics; Sulfamethoxazole; Adhesive [tape]; Codeine; Latex; and Rubbing alcohol [alcohol]  Current Medications:  Current Outpatient Medications:  .  albuterol (PROVENTIL HFA;VENTOLIN HFA) 108 (90 Base) MCG/ACT inhaler, Inhale 2 puffs into the lungs every 6 (six) hours as needed for wheezing or shortness of breath., Disp: , Rfl:  .  albuterol (PROVENTIL) (5 MG/ML) 0.5% nebulizer solution, Take 2.5 mg by nebulization every 6 (six) hours as needed for wheezing or shortness of breath., Disp: , Rfl:  .  ALPRAZolam (XANAX) 0.5 MG tablet, Take 0.5 mg by mouth 2 (two) times daily as needed for anxiety. , Disp: , Rfl:  .  amitriptyline (ELAVIL) 50 MG tablet, Take 1  tablet by mouth daily., Disp: , Rfl:  .  Bepotastine Besilate 1.5 % SOLN, Place 1 drop into both eyes 2 (two) times daily., Disp: , Rfl:  .  diphenhydrAMINE (BENADRYL) 25 MG tablet, Take 25 mg by mouth every 6 (six) hours as needed for itching or allergies., Disp: , Rfl:  .  EPINEPHrine 0.3 mg/0.3 mL IJ SOAJ injection, Inject 0.3 mg into the muscle as needed., Disp: , Rfl:  .  esomeprazole (NEXIUM) 40 MG capsule, 1 PO 30 MINUTES PRIOR TO MEALS BID, Disp: 60 capsule, Rfl: 11 .  furosemide (LASIX) 40 MG tablet, Take 40 mg by mouth daily. , Disp: , Rfl:  .  guaifenesin (ROBITUSSIN) 100 MG/5ML syrup, Take 30 mLs by mouth 3 (three) times daily as needed for cough., Disp: , Rfl:  .  guaiFENesin-codeine (ROBITUSSIN AC) 100-10 MG/5ML syrup, Take 5 mLs by mouth 4 (four) times daily as needed for cough., Disp: , Rfl:  .  hydrocortisone 2.5 % ointment, APPLY 1 APPLICATION 4 TIMES A DAY, Disp: 40 g, Rfl: 1 .  hydrOXYzine (VISTARIL) 25 MG capsule, Take 25 mg by mouth daily as needed for itching., Disp: , Rfl:  .  levocetirizine (XYZAL) 5 MG tablet, Take 5 mg by mouth every evening., Disp: , Rfl:  .  linaclotide (LINZESS) 290 MCG CAPS capsule, Take 290 mcg by mouth daily., Disp: , Rfl:  .  metFORMIN (GLUCOPHAGE) 500 MG tablet, Take 500 mg by mouth as needed. , Disp: , Rfl:  .  metoprolol  tartrate (LOPRESSOR) 25 MG tablet, Take 25 mg by mouth 2 (two) times daily. , Disp: , Rfl:  .  phentermine 37.5 MG capsule, Take 37.5 mg by mouth every morning., Disp: , Rfl:  .  QUEtiapine (SEROQUEL) 400 MG tablet, Take 800 mg by mouth at bedtime., Disp: , Rfl:  .  sertraline (ZOLOFT) 100 MG tablet, Take 100 mg by mouth daily., Disp: , Rfl:  .  topiramate (TOPAMAX) 100 MG tablet, Take 100 mg by mouth 2 (two) times daily., Disp: , Rfl:  .  UNABLE TO FIND, CPAP at bedtime, Disp: , Rfl:  .  diclofenac sodium (VOLTAREN) 1 % GEL, Apply 2 g topically 2 (two) times daily. Shoulder pain, Disp: , Rfl:  .  famotidine (PEPCID) 20 MG  tablet, Take 20 mg by mouth as needed. , Disp: , Rfl:  .  fluticasone (FLONASE) 50 MCG/ACT nasal spray, Place 1 spray into both nostrils 2 (two) times daily., Disp: , Rfl:  .  HYDROcodone-acetaminophen (NORCO) 10-325 MG tablet, Take 1 tablet by mouth every 6 (six) hours as needed for moderate pain or severe pain., Disp: , Rfl:  .  levofloxacin (LEVAQUIN) 500 MG tablet, Take 1 tablet (500 mg total) by mouth daily. (Patient not taking: Reported on 07/13/2018), Disp: 7 tablet, Rfl: 0 .  Olopatadine HCl 0.2 % SOLN, Apply 2 drops to eye 2 (two) times daily., Disp: , Rfl:  .  OMALIZUMAB [place], Inject 150 Units into the skin every 14 (fourteen) days., Disp: , Rfl:  .  prednisoLONE acetate (PRED FORTE) 1 % ophthalmic suspension, Place 1 drop into both eyes 4 (four) times daily., Disp: , Rfl:  .  ranitidine (ZANTAC) 150 MG tablet, Take 1 tablet by mouth daily., Disp: , Rfl:  Medication Side Effects: none  Family Medical/ Social History: Changes? No  MENTAL HEALTH EXAM:  There were no vitals taken for this visit.There is no height or weight on file to calculate BMI.  General Appearance: Casual, Neat, Well Groomed and Obese  Eye Contact:  Good  Speech:  Clear and Coherent  Volume:  Normal  Mood:  Euthymic  Affect:  Appropriate  Thought Process:  Goal Directed  Orientation:  Full (Time, Place, and Person)  Thought Content: Logical   Suicidal Thoughts:  No  Homicidal Thoughts:  No  Memory:  WNL  Judgement:  Good  Insight:  Good  Psychomotor Activity:  Normal  Concentration:  Concentration: Good  Recall:  Good  Fund of Knowledge: Good  Language: Good  Assets:  Desire for Improvement  ADL's:  Intact  Cognition: WNL  Prognosis:  Good    DIAGNOSES:    ICD-10-CM   1. Bipolar disorder, in partial remission, most recent episode depressed ([place]) F31.75   2. Anxiety F41.9   Asthma, CAD, GERD, IBS, NIDDM, Seizure D/O  Receiving Psychotherapy: No    RECOMMENDATIONS: Cont all meds.  I am not  ordering labs as she has those done through her PCP and other specialist. Return in 4 months or sooner as needed.   Donnal Moat, PA-C

## 2018-09-25 ENCOUNTER — Encounter: Payer: Self-pay | Admitting: Gastroenterology

## 2018-11-03 ENCOUNTER — Other Ambulatory Visit: Payer: Self-pay | Admitting: Family Medicine

## 2018-11-03 ENCOUNTER — Other Ambulatory Visit: Payer: Self-pay | Admitting: Nephrology

## 2018-11-03 DIAGNOSIS — Z1231 Encounter for screening mammogram for malignant neoplasm of breast: Secondary | ICD-10-CM

## 2018-11-06 ENCOUNTER — Ambulatory Visit: Payer: Self-pay | Admitting: Nutrition

## 2018-11-08 ENCOUNTER — Encounter: Payer: Medicare Other | Attending: Family Medicine | Admitting: Nutrition

## 2018-11-08 VITALS — Ht 62.0 in | Wt 223.0 lb

## 2018-11-08 DIAGNOSIS — E1165 Type 2 diabetes mellitus with hyperglycemia: Secondary | ICD-10-CM | POA: Diagnosis not present

## 2018-11-08 DIAGNOSIS — E118 Type 2 diabetes mellitus with unspecified complications: Secondary | ICD-10-CM | POA: Diagnosis not present

## 2018-11-08 DIAGNOSIS — E669 Obesity, unspecified: Secondary | ICD-10-CM | POA: Insufficient documentation

## 2018-11-08 DIAGNOSIS — IMO0002 Reserved for concepts with insufficient information to code with codable children: Secondary | ICD-10-CM

## 2018-11-08 NOTE — Patient Instructions (Addendum)
Goals  Increase exercise as tolerated Lose 1 lb per week. Get FBS Less than 130 mg/dl. See about making appt with Dr. Chauncey Reading for treatment of elevated blood sugars Increase fresh fruits and vegetables.

## 2018-11-08 NOTE — Progress Notes (Signed)
Medical Nutrition Therapy:  Appt start time: 1100 end time:  1215.   Assessment:  Primary concerns today: Diabetes Type 2. PMH: Hyperlipidemia, HTN, Obesity and CHF. LIves with her husband.  .FBS 200-250's Evening 160-170's. She notes she has an outstanding bill from her MD office and they won't see her unless she pays some of it. She notes she doesn't have any extra money to put towards her bill. BS still poorly controlled. She is drinking more water now and trying to eat 3 meals per day. Metformin 500 mg BID. Doesn't want to go on insulin. Educated about the need for increased Metformin and or additional medication to reduce BS and to rediscuss with PCP. Suppose to see PCP soon. Depression issues may be interferingly with her self care. She would like to see the therapist more often to help her through her problems.  Wt Readings from Last 3 Encounters:  11/08/18 223 lb (101.2 kg)  09/07/18 223 lb (101.2 kg)  07/13/18 225 lb (102.1 kg)   Ht Readings from Last 3 Encounters:  11/08/18 5' 2"  (1.575 m)  09/07/18 5' 2"  (1.575 m)  07/13/18 5' 2"  (1.575 m)   Body mass index is 40.79 kg/m. @BMIFA @ Facility age limit for growth percentiles is 20 years. Facility age limit for growth percentiles is 20 years. CMP Latest Ref Rng & Units 05/17/2017 09/19/2016 07/28/2014  Glucose 65 - 99 mg/dL 239(H) 190(H) 127(H)  BUN 6 - 20 mg/dL 18 13 13   Creatinine 0.44 - 1.00 mg/dL 0.84 0.80 0.75  Sodium 135 - 145 mmol/L 140 140 143  Potassium 3.5 - 5.1 mmol/L 4.4 4.0 3.9  Chloride 101 - 111 mmol/L 108 104 108  CO2 22 - 32 mmol/L 25 - 25  Calcium 8.9 - 10.3 mg/dL 9.6 - 8.8  Total Protein 6.0 - 8.3 g/dL - - -  Total Bilirubin 0.3 - 1.2 mg/dL - - -  Alkaline Phos 39 - 117 U/L - - -  AST 0 - 37 U/L - - -  ALT 0 - 35 U/L - - -   Lipid Panel     Component Value Date/Time   CHOL 203 (H) 09/04/2013 1728   TRIG 185 (H) 09/04/2013 1728   HDL 59 09/04/2013 1728   LDLCALC 107 (H) 09/04/2013 1728      Preferred Learning Style:   Hands on  No preference indicated   Learning Readiness:   Ready  Change in progress   MEDICATIONS:  DIETARY INTAKE:  24-hr recall:  B ( AM):  1/2 slice toast, 2 boiled eggs, water, coffee and 2-4 oz juice Snk ( AM):   L ( PM)  2 drummettes,  1/2 c corn, water Snk ( PM):  D ( PM): [place] on white wheat bread and pack of nabs. water Snk ( PM):water Beverages: water  Usual physical activity: ADL  Estimated energy needs: 1200  calories 125 g carbohydrates 90 g protein 33 g fat  Progress Towards Goal(s):  In progress.   Nutritional Diagnosis:  NB-1.1 Food and nutrition-related knowledge deficit As related to Diabetes.  As evidenced by A1C 8.3%.    Intervention:  Nutrition and Diabetes education provided on My Plate, CHO counting, meal planning, portion sizes, timing of meals, avoiding snacks between meals unless having a low blood sugar, target ranges for A1C and blood sugars, signs/symptoms and treatment of hyper/hypoglycemia, monitoring blood sugars, taking medications as prescribed, benefits of exercising 30 minutes per day and prevention of complications of DM. Goals  Increase  exercise as tolerated Lose 1 lb per week. Get FBS Less than 130 mg/dl. See about making appt with Dr. Chauncey Reading for treatment of elevated blood sugars    Teaching Method Utilized:  Visual Auditory Hands on  Handouts given during visit include:  The Plate Method   Meal Plan   Barriers to learning/adherence to lifestyle change: none  Demonstrated degree of understanding via:  Teach Back   Monitoring/Evaluation:  Dietary intake, exercise, meal planning, and body weight in 1 month(s). Will contact PCP to see if there is something that can be done with her outstanding bill. She needs better treatment for her DM.

## 2018-11-14 ENCOUNTER — Encounter: Payer: Self-pay | Admitting: Nutrition

## 2018-11-30 ENCOUNTER — Ambulatory Visit
Admission: RE | Admit: 2018-11-30 | Discharge: 2018-11-30 | Disposition: A | Discharge status: Home / Self Care | Payer: Medicare Other | Source: Ambulatory Visit | Attending: Family Medicine | Admitting: Family Medicine

## 2018-11-30 DIAGNOSIS — Z1231 Encounter for screening mammogram for malignant neoplasm of breast: Secondary | ICD-10-CM | POA: Diagnosis not present

## 2018-12-01 ENCOUNTER — Other Ambulatory Visit: Payer: Self-pay | Admitting: Physician Assistant

## 2018-12-13 ENCOUNTER — Encounter: Payer: Self-pay | Admitting: *Deleted

## 2018-12-22 ENCOUNTER — Encounter: Payer: Self-pay | Admitting: Cardiology

## 2018-12-22 ENCOUNTER — Ambulatory Visit (INDEPENDENT_AMBULATORY_CARE_PROVIDER_SITE_OTHER): Payer: Medicare Other | Admitting: Cardiology

## 2018-12-22 VITALS — BP 138/84 | HR 72 | Ht 62.0 in | Wt 220.6 lb

## 2018-12-22 DIAGNOSIS — R002 Palpitations: Secondary | ICD-10-CM

## 2018-12-22 DIAGNOSIS — M542 Cervicalgia: Secondary | ICD-10-CM

## 2018-12-22 DIAGNOSIS — R221 Localized swelling, mass and lump, neck: Secondary | ICD-10-CM | POA: Diagnosis not present

## 2018-12-22 NOTE — Progress Notes (Signed)
Electrophysiology Office Note   Date:  12/22/2018   ID:  Kathryn Olson, DOB Oct 30, 1960, MRN [id number]  PCP:  Emelda Fear, DO  Primary Electrophysiologist:  Constance Haw, MD    No chief complaint on file.    History of Present Illness: Kathryn Olson is a 58 y.o. female who presents today for electrophysiology evaluation.   She was admitted to the hospital in [place] in August with apparently an anaphylactic reaction. At that time she had an echocardiogram and told that she was in congestive heart failure. Prior to that, she says that she was having chest pain.  He had a nuclear stress test performed which showed no evidence of ischemia. She was having palpitations. She wore a 30 day monitor that showed no evidence of tachycardia arrhythmia. She presents today for follow-up.  Today, denies symptoms of palpitations, chest pain, shortness of breath, orthopnea, PND, lower extremity edema, claudication, dizziness, presyncope, syncope, bleeding, or neurologic sequela. The patient is tolerating medications without difficulties.  Overall she is feeling well.  Her main complaint is of right neck swelling, pain, and warmth.  He says this is been going on for the last few weeks to months.  She is also having some trouble swallowing which is being worked up by GI.   Past Medical History:  Diagnosis Date  . Anxiety   . Asthma   . CHF (congestive heart failure) ([place])   . Chronic abdominal pain   . Chronic headaches   . Complication of anesthesia   . Crohn's disease [facility]) June 2013  . Environmental allergies   . Family history of anesthesia complication    MH during C Section  . H/O colonoscopy   . History of electroencephalogram 03/2014   normal  . IBS (irritable bowel syndrome) June 2014  . 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 Migraine   . Obsessive-compulsive disorder   . PTSD (post-traumatic stress disorder)   . Seizures ([place])    Past  Surgical History:  Procedure Laterality Date  . BLADDER SURGERY     age 73  . CESAREAN SECTION     X2  . COLONOSCOPY  05/22/2012   SLF: Polyps, multiple hyperplastic in the sigmoid colon/Polyp in the rectum/  MODERATE Diverticulosis throughout the colon/ Internal hemorrhoids  . ESOPHAGOGASTRODUODENOSCOPY     remote, had ulcers, [place], Dr. Truddie Coco  . ESOPHAGOGASTRODUODENOSCOPY  05/22/2012   SLF: Stricture in the distal esophagus/Moderate gastritis  . PARTIAL HYSTERECTOMY    . TUBAL LIGATION       Current Outpatient Medications  Medication Sig Dispense Refill  . albuterol (PROVENTIL HFA;VENTOLIN HFA) 108 (90 Base) MCG/ACT inhaler Inhale 2 puffs into the lungs every 6 (six) hours as needed for wheezing or shortness of breath.    Marland Kitchen albuterol (PROVENTIL) (5 MG/ML) 0.5% nebulizer solution Take 2.5 mg by nebulization every 6 (six) hours as needed for wheezing or shortness of breath.    . ALPRAZolam (XANAX) 0.5 MG tablet Take 1 tablet (0.5 mg total) by mouth 2 (two) times daily as needed for anxiety. 60 tablet 5  . amitriptyline (ELAVIL) 50 MG tablet Take 1 tablet by mouth daily.    . Bepotastine Besilate 1.5 % SOLN Place 1 drop into both eyes 2 (two) times daily.    . diclofenac sodium (VOLTAREN) 1 % GEL Apply 2 g topically 2 (two) times daily. Shoulder pain    . diphenhydrAMINE (BENADRYL) 25 MG tablet Take  25 mg by mouth every 6 (six) hours as needed for itching or allergies.    Marland Kitchen empagliflozin (JARDIANCE) 25 MG TABS tablet Take 25 mg by mouth daily.    Marland Kitchen EPINEPHrine 0.3 mg/0.3 mL IJ SOAJ injection Inject 0.3 mg into the muscle as needed.    Marland Kitchen esomeprazole (NEXIUM) 40 MG capsule 1 PO 30 MINUTES PRIOR TO MEALS BID 60 capsule 11  . famotidine (PEPCID) 20 MG tablet Take 20 mg by mouth as needed.     . fluticasone (FLONASE) 50 MCG/ACT nasal spray Place 1 spray into both nostrils 2 (two) times daily.    . furosemide (LASIX) 40 MG tablet Take 40 mg by mouth daily.     Marland Kitchen guaifenesin  (ROBITUSSIN) 100 MG/5ML syrup Take 30 mLs by mouth 3 (three) times daily as needed for cough.    Marland Kitchen guaiFENesin-codeine (ROBITUSSIN AC) 100-10 MG/5ML syrup Take 5 mLs by mouth 4 (four) times daily as needed for cough.    Marland Kitchen HYDROcodone-acetaminophen (NORCO) 10-325 MG tablet Take 1 tablet by mouth every 6 (six) hours as needed for moderate pain or severe pain.    . hydrocortisone 2.5 % ointment APPLY 1 APPLICATION 4 TIMES A DAY 40 g 1  . hydrOXYzine (VISTARIL) 25 MG capsule Take 25 mg by mouth daily as needed for itching.    . levocetirizine (XYZAL) 5 MG tablet Take 5 mg by mouth every evening.    Marland Kitchen levofloxacin (LEVAQUIN) 500 MG tablet Take 1 tablet (500 mg total) by mouth daily. 7 tablet 0  . linaclotide (LINZESS) 290 MCG CAPS capsule Take 290 mcg by mouth daily.    . metFORMIN (GLUCOPHAGE) 500 MG tablet Take 500 mg by mouth as needed.     . metoprolol tartrate (LOPRESSOR) 25 MG tablet Take 25 mg by mouth 2 (two) times daily.     . Olopatadine HCl 0.2 % SOLN Apply 2 drops to eye 2 (two) times daily.    . OMALIZUMAB [place] Inject 150 Units into the skin every 14 (fourteen) days.    . phentermine 37.5 MG capsule Take 37.5 mg by mouth every morning.    . prednisoLONE acetate (PRED FORTE) 1 % ophthalmic suspension Place 1 drop into both eyes 4 (four) times daily.    . QUEtiapine (SEROQUEL) 400 MG tablet Take 2 tablets (800 mg total) by mouth at bedtime. 60 tablet 5  . ranitidine (ZANTAC) 150 MG tablet Take 1 tablet by mouth daily.    . sertraline (ZOLOFT) 100 MG tablet TAKE 1 TABLET BY MOUTH EVERY DAY 90 tablet 1  . topiramate (TOPAMAX) 100 MG tablet Take 100 mg by mouth 2 (two) times daily.    Marland Kitchen UNABLE TO FIND CPAP at bedtime     No current facility-administered medications for this visit.     Allergies:   Aspirin; Peanut oil; Peanut-containing drug products; Penicillins; Sulfa antibiotics; Sulfamethoxazole; Adhesive [tape]; Codeine; Latex; and Rubbing alcohol [alcohol]   Social History:  The  patient  reports that she quit smoking about 7 years ago. She has never used smokeless tobacco. She reports that she does not drink alcohol or use drugs.   Family History:  The patient's family history includes Alcohol abuse in her father; Anxiety disorder in her sister and sister; Bipolar disorder in her sister; Colon cancer in her maternal grandfather; Dementia in her mother; Depression in her mother and sister; Diabetes in her mother; Heart attack in her father; Heart murmur in her son; Hypertension in her mother; Liver disease  in her brother; Malignant hyperthermia in her daughter; OCD in her sister.    ROS:  Please see the history of present illness.   Otherwise, review of systems is positive for weight change, appetite change, sweating, chest pain, shortness of breath, palpitations, visual changes, snoring, wheezing, abdominal pain, constipation, depression, anxiety, back pain, muscle pain, easy bruising, headaches.   All other systems are reviewed and negative.   PHYSICAL EXAM: VS:  BP 138/84   Pulse 72   Ht 5' 2"  (1.575 m)   Wt 220 lb 9.6 oz (100.1 kg)   SpO2 99%   BMI 40.35 kg/m  , BMI Body mass index is 40.35 kg/m. GEN: Well nourished, well developed, in no acute distress  HEENT: normal  Neck: no JVD, carotid bruits, or masses Cardiac: RRR; no murmurs, rubs, or gallops,no edema  Respiratory:  clear to auscultation bilaterally, normal work of breathing GI: soft, nontender, nondistended, + BS MS: no deformity or atrophy  Skin: warm and dry Neuro:  Strength and sensation are intact Psych: euthymic mood, full affect  EKG:  EKG is ordered today. Personal review of the ekg ordered shows this rhythm, rate 72  Recent Labs: 06/26/2018: NT-Pro BNP 24    Lipid Panel     Component Value Date/Time   CHOL 203 (H) 09/04/2013 1728   TRIG 185 (H) 09/04/2013 1728   HDL 59 09/04/2013 1728   LDLCALC 107 (H) 09/04/2013 1728     Wt Readings from Last 3 Encounters:  12/22/18 220 lb 9.6  oz (100.1 kg)  11/08/18 223 lb (101.2 kg)  09/07/18 223 lb (101.2 kg)      Other studies Reviewed: Additional studies/ records that were reviewed today include: Myoview 01/05/17  Nuclear stress EF: 55%.  There was no ST segment deviation noted during stress.  The study is normal.  The left ventricular ejection fraction is normal (55-65%).   1. EF 55%, normal wall motion.  2. No significant perfusion defect, no evidence for ischemia or infarction.  3. Normal study.    ASSESSMENT AND PLAN:  1.  Hypertension: Mildly elevated today but has been more normal in the past.  We Hyla Coard continue to monitor.  Currently well controlled.  No changes.  2. Chest pain: Stress test without evidence of ischemia.  No current chest pain.  3. Palpitations: No evidence of arrhythmia on monitor.  No changes.  4. Reported congestive heart failure: No signs of volume overload today.  5.  Right neck swelling, pain, warmth: We Jearldean Gutt plan for ultrasound.  Is minimally swollen on exam today.  Current medicines are reviewed at length with the patient today.   The patient does not have concerns regarding her medicines.  The following changes were made today: None  Labs/ tests ordered today include:  Orders Placed This Encounter  Procedures  . US Soft Tissue Head/Neck  . EKG 12-Lead     Disposition:   FU with Derrek Puff 12 month  Signed, Petrona Wyeth Meredith Leeds, MD  12/22/2018 3:41 PM     [address] [place] [id number] [phone] (office) [phone] (fax)

## 2018-12-22 NOTE — Patient Instructions (Addendum)
Medication Instructions:  Your physician recommends that you continue on your current medications as directed. Please refer to the Current Medication list given to you today.  * If you need a refill on your cardiac medications before your next appointment, please call your pharmacy.   Labwork: None ordered  Testing/Procedures: Your physician recommends you have a neck ultrasound at [facility]: Your physician wants you to follow-up in: 1 year with Dr. Curt Bears.  You will receive a reminder letter in the mail two months in advance. If you don't receive a letter, please call our office to schedule the follow-up appointment.  Thank you for choosing [facility]!!   Trinidad Curet, RN [phone]

## 2018-12-25 ENCOUNTER — Ambulatory Visit
Admission: RE | Admit: 2018-12-25 | Discharge: 2018-12-25 | Disposition: A | Discharge status: Home / Self Care | Payer: Medicare Other | Source: Ambulatory Visit | Attending: Cardiology | Admitting: Cardiology

## 2018-12-25 DIAGNOSIS — R221 Localized swelling, mass and lump, neck: Secondary | ICD-10-CM

## 2018-12-25 DIAGNOSIS — M542 Cervicalgia: Secondary | ICD-10-CM | POA: Diagnosis not present

## 2019-01-19 ENCOUNTER — Ambulatory Visit: Payer: Medicare Other | Admitting: Physician Assistant

## 2019-02-06 ENCOUNTER — Encounter: Payer: Medicare Other | Attending: Family Medicine | Admitting: Nutrition

## 2019-02-06 ENCOUNTER — Other Ambulatory Visit: Payer: Self-pay

## 2019-02-06 ENCOUNTER — Telehealth: Payer: Self-pay | Admitting: Nutrition

## 2019-02-06 DIAGNOSIS — E1165 Type 2 diabetes mellitus with hyperglycemia: Secondary | ICD-10-CM | POA: Diagnosis not present

## 2019-02-06 DIAGNOSIS — E118 Type 2 diabetes mellitus with unspecified complications: Secondary | ICD-10-CM | POA: Insufficient documentation

## 2019-02-06 DIAGNOSIS — IMO0002 Reserved for concepts with insufficient information to code with codable children: Secondary | ICD-10-CM

## 2019-02-06 NOTE — Progress Notes (Signed)
Telephone follow up visit Medical Nutrition Therapy:  Appt start time:1500  end time:  2542.   Assessment:  Primary concerns today: Diabetes Type 2. PMH: Hyperlipidemia, HTN, Obesity and CHF. LIves with her husband.  Feels like she her clothes a lot looser. Feels like she has lost weight. FBS 160's and evenings: 150's.  Walks on treadmill EOD for a few miles. Being treated for depression. Sees therapist in June 2020. She hasn't had any follow up at Dr. Chauncey Reading office since early Fall. Needs better management of her DM. She may need to go [facility] due to financial restrictions.  .FBS 200-250's Evening 160-170's. She notes she has an outstanding bill from her MD office and they won't see her unless she pays some of it. She notes she doesn't have any extra money to put towards her bill. BS still poorly controlled. She is drinking more water now and trying to eat 3 meals per day. Metformin 500 mg BID. Doesn't want to go on insulin. Educated about the need for increased Metformin and or additional medication to reduce BS and to rediscuss with PCP. Suppose to see PCP soon. Depression issues may be interferingly with her self care.   Wt Readings from Last 3 Encounters:  12/22/18 220 lb 9.6 oz (100.1 kg)  11/08/18 223 lb (101.2 kg)  09/07/18 223 lb (101.2 kg)   Ht Readings from Last 3 Encounters:  12/22/18 5' 2"  (1.575 m)  11/08/18 5' 2"  (1.575 m)  09/07/18 5' 2"  (1.575 m)   There is no height or weight on file to calculate BMI. @BMIFA @ Facility age limit for growth percentiles is 20 years. Facility age limit for growth percentiles is 20 years. CMP Latest Ref Rng & Units 05/17/2017 09/19/2016 07/28/2014  Glucose 65 - 99 mg/dL 239(H) 190(H) 127(H)  BUN 6 - 20 mg/dL 18 13 13   Creatinine 0.44 - 1.00 mg/dL 0.84 0.80 0.75  Sodium 135 - 145 mmol/L 140 140 143  Potassium 3.5 - 5.1 mmol/L 4.4 4.0 3.9  Chloride 101 - 111 mmol/L 108 104 108  CO2 22 - 32 mmol/L 25 - 25  Calcium 8.9 -  10.3 mg/dL 9.6 - 8.8  Total Protein 6.0 - 8.3 g/dL - - -  Total Bilirubin 0.3 - 1.2 mg/dL - - -  Alkaline Phos 39 - 117 U/L - - -  AST 0 - 37 U/L - - -  ALT 0 - 35 U/L - - -   Lipid Panel     Component Value Date/Time   CHOL 203 (H) 09/04/2013 1728   TRIG 185 (H) 09/04/2013 1728   HDL 59 09/04/2013 1728   LDLCALC 107 (H) 09/04/2013 1728     Preferred Learning Style:   Hands on  No preference indicated   Learning Readiness:   Ready  Change in progress   MEDICATIONS:  DIETARY INTAKE:  24-hr recall:  B ( AM):   2 boiled eggs, toast and fruit, water Snk ( AM):   L ( PM)   [place] on wheat bread, water, orange Snk ( PM):  D ( PM): Chicken noodle soup, water,  Snk ( PM):water Beverages: water  Usual physical activity: ADL  Estimated energy needs: 1200  calories 125 g carbohydrates 90 g protein 33 g fat  Progress Towards Goal(s):  In progress.   Nutritional Diagnosis:  NB-1.1 Food and nutrition-related knowledge deficit As related to Diabetes.  As evidenced by A1C 8.3%.    Intervention:  Nutrition and  Diabetes education provided on My Plate, CHO counting, meal planning, portion sizes, timing of meals, avoiding snacks between meals unless having a low blood sugar, target ranges for A1C and blood sugars, signs/symptoms and treatment of hyper/hypoglycemia, monitoring blood sugars, taking medications as prescribed, benefits of exercising 30 minutes per day and prevention of complications of DM. Goals  Increase exercise as tolerated Lose 1 lb per week. Get FBS Less than 130 mg/dl. See about making appt with Dr. Chauncey Reading for treatment of elevated blood sugars    Teaching Method Utilized:  Visual Auditory Hands on  Handouts given during visit include:  The Plate Method   Meal Plan   Barriers to learning/adherence to lifestyle change: none  Demonstrated degree of understanding via:  Teach Back   Monitoring/Evaluation:  Dietary intake, exercise, meal  planning, and body weight in 1 month(s). Will contact PCP to see if there is something that can be done with her outstanding bill. She needs better treatment for her DM.

## 2019-02-06 NOTE — Telephone Encounter (Signed)
TC to Dr. Chauncey Reading office to call back with most recent lab results for review of her DM.

## 2019-02-07 ENCOUNTER — Encounter: Payer: Self-pay | Admitting: Nutrition

## 2019-02-07 NOTE — Patient Instructions (Addendum)
Goals Continue to exercise and walk 4 times per week Increase lower carb vegetables with lunch and dinner Be sure to get 2 carb choices per meal Keep drinking water Make follow up appt with Dr. Chauncey Reading office for labs and follow up.

## 2019-02-08 ENCOUNTER — Encounter: Payer: Self-pay | Admitting: Gastroenterology

## 2019-02-22 ENCOUNTER — Encounter: Payer: Self-pay | Admitting: Gastroenterology

## 2019-02-22 ENCOUNTER — Ambulatory Visit (INDEPENDENT_AMBULATORY_CARE_PROVIDER_SITE_OTHER): Payer: Medicare Other | Admitting: Gastroenterology

## 2019-02-22 ENCOUNTER — Other Ambulatory Visit: Payer: Self-pay

## 2019-02-22 DIAGNOSIS — Z1159 Encounter for screening for other viral diseases: Secondary | ICD-10-CM | POA: Diagnosis not present

## 2019-02-22 DIAGNOSIS — K219 Gastro-esophageal reflux disease without esophagitis: Secondary | ICD-10-CM | POA: Diagnosis not present

## 2019-02-22 DIAGNOSIS — K581 Irritable bowel syndrome with constipation: Secondary | ICD-10-CM | POA: Diagnosis not present

## 2019-02-22 NOTE — Progress Notes (Signed)
Subjective:    Patient ID: Kathryn Olson, female    DOB: August 21, 1961, 58 y.o.   MRN: [id number]   Primary Care Physician:  Emelda Fear, DO  Primary GI:  Barney Drain, MD   Patient Location: home   Provider Location: [facility] office   Reason for Visit: GERD   Persons present on the virtual encounter, with roles: patient, myself (provider), [place] (update meds/allergies)   Total time (minutes) spent on medical discussion:  MINUTES   Due to COVID-19, visit was VIA TELEPHONE VISIT DUE TO COVID 19. VISIT IS CONDUCTED VIRTUALLY AND WAS REQUESTED BY PATIENT.   Virtual Visit via TELEPHONE   I connected with Kathryn Olson and verified that I am speaking with the correct person using two identifiers.   I discussed the limitations, risks, security and privacy concerns of performing an evaluation and management service by telephone/video and the availability of in person appointments. I also discussed with the patient that there may be a patient responsible charge related to this service. The patient expressed understanding and agreed to proceed.   HPI BMs: QOD. USING LINZESS AND EATING A LOT OF FIBER. DRINK 2% MILK AND LEAFY GREENS. HEARTBURN ONLY WITH CERTAIN DRINKS: FRESH LEMONADE. MILD LEFT ABDOMINAL PAIN: ALL THE TIME, NO WORSE THAN USUAL. APPETITE IS GOOD AND LOSING WEIGHT AND WALKING 5 MILES EVERY OTHER DAY ON TREADMILL  PT DENIES FEVER, CHILLS, HEMATOCHEZIA, HEMATEMESIS, nausea, vomiting, melena, diarrhea, CHEST PAIN, SHORTNESS OF BREATH, CHANGE IN BOWEL IN HABITS, constipation, problems swallowing, OR heartburn or indigestion.  Past Medical History:  Diagnosis Date   Anxiety    Asthma    CHF (congestive heart failure) ([place])    Chronic abdominal pain    Chronic headaches    Complication of anesthesia    Crohn's disease ([place]) June 2013   Environmental allergies    Family history of anesthesia complication    MH during C Section   H/O colonoscopy    History  of electroencephalogram 03/2014   normal   IBS (irritable bowel syndrome) June 2014   Malignant hyperthemia    [name] /   Pt has been tested positive   Migraine    Obsessive-compulsive disorder    PTSD (post-traumatic stress disorder)    Seizures ([place])     Past Surgical History:  Procedure Laterality Date   BLADDER SURGERY     age 40   CESAREAN SECTION     X2   COLONOSCOPY  05/22/2012   SLF: Polyps, multiple hyperplastic in the sigmoid colon/Polyp in the rectum/  MODERATE Diverticulosis throughout the colon/ Internal hemorrhoids   ESOPHAGOGASTRODUODENOSCOPY     remote, had ulcers, [place], Dr. Truddie Coco   ESOPHAGOGASTRODUODENOSCOPY  05/22/2012   SLF: Stricture in the distal esophagus/Moderate gastritis   PARTIAL HYSTERECTOMY     TUBAL LIGATION     Allergies  Allergen Reactions   Aspirin Anaphylaxis and Swelling   Peanut Oil Anaphylaxis   Peanut-Containing Drug Products Anaphylaxis        Penicillins Anaphylaxis       Sulfa Antibiotics Anaphylaxis   Sulfamethoxazole Anaphylaxis and Itching   Adhesive [Tape] Rash   Codeine Itching and Rash   Latex Rash   Rubbing Alcohol [Alcohol] Rash    Current Outpatient Medications  Medication Sig     albuterol (PROVENTIL HFA;VENTOLIN HFA) 108 (90 Base) MCG/ACT inhaler Inhale 2 puffs into the lungs every 6 (six) hours as needed for wheezing or shortness of breath.     albuterol (PROVENTIL) (  5 MG/ML) 0.5% nebulizer solution Take 2.5 mg by nebulization every 6 (six) hours as needed for wheezing or shortness of breath.     ALPRAZolam (XANAX) 0.5 MG tablet Take 1 tablet (0.5 mg total) by mouth 2 (two) times daily as needed for anxiety.     amitriptyline (ELAVIL) 50 MG tablet Take 1 tablet by mouth QHS     diclofenac sodium (VOLTAREN) 1 % GEL Apply 2 g topically 2 (two) times daily. Shoulder pain     diphenhydrAMINE (BENADRYL) 25 MG tablet Take 25 mg by mouth every 6 (six) hours as needed for itching  or allergies.     JARDIANCE) 25 MG TABS tablet Take 25 mg by mouth daily.     EPINEPHrine 0.3 mg/0.3 mL IJ SOAJ injection Inject 0.3 mg into the muscle as needed.     NEXIUM) 40 MG capsule 1 PO 30 MINUTES PRIOR TO MEALS BID most days     furosemide (LASIX) 40 MG tablet Take 40 mg by mouth daily.      guaifenesin 100 MG/5ML syrup Take 30 mLs tid as needed for cough.     guaiFENesin-codeine syrup Take 5 mLs qid as needed for cough.     hydrOXYzine (VISTARIL) 25 MG capsule Take 25 mg by mouth daily as needed for itching.     levocetirizine (XYZAL) 5 MG tablet Take 5 mg by mouth every evening.     linaclotide 290 MCG CAPS capsule Take 290 mcg by mouth daily.     LOPRESSOR 25 MG tablet Take 25 mg by mouth 2 (two) times daily.            SEROQUEL) 400 MG tablet Take 2 tablets (800 mg total) by mouth at bedtime.     sertraline (ZOLOFT) 100 MG tablet TAKE 1 TABLET BY MOUTH EVERY DAY     topiramate (TOPAMAX) 100 MG tablet Take 100 mg by mouth 2 (two) times daily.     UNABLE TO FIND CPAP at bedtime     Bepotastine Besilate 1.5 % SOLN Place 1 drop into both eyes 2 (two) times daily.     famotidine (PEPCID) 20 MG tablet Take 20 mg by mouth as needed.      fluticasone 50 MCG/ACT nasal spray Place 1 spray into both nostrils 2 (two) times daily.     NORCO 10-325 MG tablet Take 1 tablet q6h prn for moderate pain or severe pain.                       Olopatadine HCl 0.2 % SOLN Apply 2 drops to eye 2 (two) times daily.     OMALIZUMAB [place] Inject 150 Units into the skin every 14 (fourteen) days.     PREDFORTE 1 % ophth suspension Place 1 drop into both eyes 4 (four) times daily.     ranitidine (ZANTAC) 150 MG tablet Take 1 tablet by mouth daily.     Review of Systems PER HPI OTHERWISE ALL SYSTEMS ARE NEGATIVE.    Objective:   Physical Exam  TELEPHONE VISIT DUE TO COVID 19, VISIT IS CONDUCTED VIRTUALLY AND WAS REQUESTED BY PATIENT.     Assessment & Plan:

## 2019-02-22 NOTE — Assessment & Plan Note (Signed)
SYMPTOMS FAIRLY WELL CONTROLLED.  CONTINUE TO MONITOR SYMPTOMS. CONTINUE NEXIUM. TAKE 30 MINUTES BEFORE MEALS ONCE OR TWICE DAILY. PEPCID PRN. FOLLOW UP IN 6 MOS.

## 2019-02-22 NOTE — Progress Notes (Signed)
ON RECALL  °

## 2019-02-22 NOTE — Patient Instructions (Signed)
YOU NEED HEPATITIS C  TEST BEFORE YOUR NEXT VISIT.  KEEP UP THE GOOD WORK. CONTINUE YOUR WEIGHT LOSS EFFORTS.   CONTINUE LINZESS.  FOLLOW UP IN 6 MOS.

## 2019-02-22 NOTE — Assessment & Plan Note (Signed)
SYMPTOMS FAIRLY WELL CONTROLLED.  CONTINUE TO MONITOR SYMPTOMS. CONTINUE LINZESS. FOLLOW UP IN 6 MOS.

## 2019-02-22 NOTE — Assessment & Plan Note (Signed)
YOU NEED HEPATITIS C  TEST AFTER JUN 1. FOLLOW UP IN 6 MOS.

## 2019-02-23 ENCOUNTER — Other Ambulatory Visit: Payer: Self-pay

## 2019-02-23 ENCOUNTER — Ambulatory Visit (INDEPENDENT_AMBULATORY_CARE_PROVIDER_SITE_OTHER): Payer: Medicare Other | Admitting: Physician Assistant

## 2019-02-23 ENCOUNTER — Encounter: Payer: Self-pay | Admitting: Physician Assistant

## 2019-02-23 DIAGNOSIS — F411 Generalized anxiety disorder: Secondary | ICD-10-CM | POA: Diagnosis not present

## 2019-02-23 DIAGNOSIS — F3175 Bipolar disorder, in partial remission, most recent episode depressed: Secondary | ICD-10-CM | POA: Diagnosis not present

## 2019-02-23 MED ORDER — QUETIAPINE FUMARATE 400 MG PO TABS: 800.0000 mg | ORAL_TABLET | Freq: Every day | ORAL | 5 refills | Status: DC

## 2019-02-23 MED ORDER — ALPRAZOLAM 0.5 MG PO TABS: 0.5000 mg | ORAL_TABLET | Freq: Two times a day (BID) | ORAL | 5 refills | Status: DC | PRN

## 2019-02-23 NOTE — Progress Notes (Signed)
[facility] Med Check  Patient ID: Kathryn Olson,  MRN: [id number]  PCP: Emelda Fear, DO  Date of Evaluation: 02/23/2019 Time spent:15 minutes  Chief Complaint:  Chief Complaint    Follow-up     Virtual Visit via Telephone Note  I connected with patient by a video enabled telemedicine application or telephone, with their informed consent, and verified patient privacy and that I am speaking with the correct person using two identifiers.  I am private, in my home and the patient is at home.  I discussed the limitations, risks, security and privacy concerns of performing an evaluation and management service by telephone and the availability of in person appointments. I also discussed with the patient that there may be a patient responsible charge related to this service. The patient expressed understanding and agreed to proceed.   I discussed the assessment and treatment plan with the patient. The patient was provided an opportunity to ask questions and all were answered. The patient agreed with the plan and demonstrated an understanding of the instructions.   The patient was advised to call back or seek an in-person evaluation if the symptoms worsen or if the condition fails to improve as anticipated.  I provided 15 minutes of non-face-to-face time during this encounter.  HISTORY/CURRENT STATUS: HPI patient here for routine med check.  Patient states that she is doing very well.  Her medications are working fine.  She is able to enjoy things.  Her energy and motivation are good.  She is only isolating right now because of the coronavirus pandemic shelter in place orders.  She likes being at home anyway so has no problem with that.  She does not cry easily.  She denies any suicidal or homicidal thoughts.  Patient denies increased energy with decreased need for sleep, no increased talkativeness, no racing thoughts, no impulsivity or risky behaviors, no increased spending, no increased  libido, no grandiosity.  Denies dizziness, syncope, seizures, numbness, tingling, tremor, tics, unsteady gait, slurred speech, confusion.  Denies muscle or joint pain, stiffness, or dystonia.  Individual Medical History/ Review of Systems: Changes? :No    Past medications for mental health diagnoses include: Equetro, Seroquel, VPA, Xanax, Buspar  Allergies: Aspirin; Peanut oil; Peanut-containing drug products; Penicillins; Sulfa antibiotics; Sulfamethoxazole; Adhesive [tape]; Codeine; Latex; and Rubbing alcohol [alcohol]  Current Medications:  Current Outpatient Medications:  .  albuterol (PROVENTIL HFA;VENTOLIN HFA) 108 (90 Base) MCG/ACT inhaler, Inhale 2 puffs into the lungs every 6 (six) hours as needed for wheezing or shortness of breath., Disp: , Rfl:  .  albuterol (PROVENTIL) (5 MG/ML) 0.5% nebulizer solution, Take 2.5 mg by nebulization every 6 (six) hours as needed for wheezing or shortness of breath., Disp: , Rfl:  .  ALPRAZolam (XANAX) 0.5 MG tablet, Take 1 tablet (0.5 mg total) by mouth 2 (two) times daily as needed for anxiety., Disp: 60 tablet, Rfl: 5 .  amitriptyline (ELAVIL) 50 MG tablet, Take 1 tablet by mouth daily., Disp: , Rfl:  .  diclofenac sodium (VOLTAREN) 1 % GEL, Apply 2 g topically 2 (two) times daily. Shoulder pain, Disp: , Rfl:  .  EPINEPHrine 0.3 mg/0.3 mL IJ SOAJ injection, Inject 0.3 mg into the muscle as needed., Disp: , Rfl:  .  esomeprazole (NEXIUM) 40 MG capsule, 1 PO 30 MINUTES PRIOR TO MEALS BID, Disp: 60 capsule, Rfl: 11 .  famotidine (PEPCID) 20 MG tablet, Take 20 mg by mouth as needed. , Disp: , Rfl:  .  fluticasone (FLONASE) 50 MCG/ACT  nasal spray, Place 1 spray into both nostrils 2 (two) times daily., Disp: , Rfl:  .  furosemide (LASIX) 40 MG tablet, Take 40 mg by mouth daily. , Disp: , Rfl:  .  guaifenesin (ROBITUSSIN) 100 MG/5ML syrup, Take 30 mLs by mouth 3 (three) times daily as needed for cough., Disp: , Rfl:  .  guaiFENesin-codeine (ROBITUSSIN  AC) 100-10 MG/5ML syrup, Take 5 mLs by mouth 4 (four) times daily as needed for cough., Disp: , Rfl:  .  hydrocortisone 2.5 % ointment, APPLY 1 APPLICATION 4 TIMES A DAY, Disp: 40 g, Rfl: 1 .  hydrOXYzine (VISTARIL) 25 MG capsule, Take 25 mg by mouth daily as needed for itching., Disp: , Rfl:  .  levocetirizine (XYZAL) 5 MG tablet, Take 5 mg by mouth every evening., Disp: , Rfl:  .  linaclotide (LINZESS) 290 MCG CAPS capsule, Take 290 mcg by mouth daily., Disp: , Rfl:  .  metFORMIN (GLUCOPHAGE) 500 MG tablet, Take 500 mg by mouth as needed. , Disp: , Rfl:  .  metoprolol tartrate (LOPRESSOR) 25 MG tablet, Take 25 mg by mouth 2 (two) times daily. , Disp: , Rfl:  .  QUEtiapine (SEROQUEL) 400 MG tablet, Take 2 tablets (800 mg total) by mouth at bedtime., Disp: 60 tablet, Rfl: 5 .  sertraline (ZOLOFT) 100 MG tablet, TAKE 1 TABLET BY MOUTH EVERY DAY, Disp: 90 tablet, Rfl: 1 .  topiramate (TOPAMAX) 100 MG tablet, Take 100 mg by mouth 2 (two) times daily., Disp: , Rfl:  .  UNABLE TO FIND, CPAP at bedtime, Disp: , Rfl:  .  Bepotastine Besilate 1.5 % SOLN, Place 1 drop into both eyes 2 (two) times daily., Disp: , Rfl:  .  diphenhydrAMINE (BENADRYL) 25 MG tablet, Take 25 mg by mouth every 6 (six) hours as needed for itching or allergies., Disp: , Rfl:  .  empagliflozin (JARDIANCE) 25 MG TABS tablet, Take 25 mg by mouth daily., Disp: , Rfl:  .  HYDROcodone-acetaminophen (NORCO) 10-325 MG tablet, Take 1 tablet by mouth every 6 (six) hours as needed for moderate pain or severe pain., Disp: , Rfl:  .  levofloxacin (LEVAQUIN) 500 MG tablet, Take 1 tablet (500 mg total) by mouth daily. (Patient not taking: Reported on 02/22/2019), Disp: 7 tablet, Rfl: 0 .  Olopatadine HCl 0.2 % SOLN, Apply 2 drops to eye 2 (two) times daily., Disp: , Rfl:  .  OMALIZUMAB [place], Inject 150 Units into the skin every 14 (fourteen) days., Disp: , Rfl:  .  phentermine 37.5 MG capsule, Take 37.5 mg by mouth every morning., Disp: , Rfl:  .   prednisoLONE acetate (PRED FORTE) 1 % ophthalmic suspension, Place 1 drop into both eyes 4 (four) times daily., Disp: , Rfl:  .  ranitidine (ZANTAC) 150 MG tablet, Take 1 tablet by mouth daily., Disp: , Rfl:  Medication Side Effects: none  Family Medical/ Social History: Changes? Yes shelter in place b/c coronavirus pandemic.  No real changes for her b/c she likes to stay home anyway.   MENTAL HEALTH EXAM:  There were no vitals taken for this visit.There is no height or weight on file to calculate BMI.  General Appearance: unable to assess  Eye Contact:  unable to assess  Speech:  Clear and Coherent  Volume:  Normal  Mood:  Euthymic  Affect:  unable to assess  Thought Process:  Goal Directed  Orientation:  Full (Time, Place, and Person)  Thought Content: Logical   Suicidal Thoughts:  No  Homicidal Thoughts:  No  Memory:  WNL  Judgement:  Good  Insight:  Good  Psychomotor Activity:  Unable to assess  Concentration:  Concentration: Good  Recall:  Good  Fund of Knowledge: Good  Language: Good  Assets:  Desire for Improvement  ADL's:  Intact  Cognition: WNL  Prognosis:  Good    DIAGNOSES:    ICD-10-CM   1. Bipolar disorder, in partial remission, most recent episode depressed ([place]) F31.75   2. Generalized anxiety disorder F41.1     Receiving Psychotherapy: No    RECOMMENDATIONS: I am glad to see her doing so well! Continue Xanax 0.5 mg twice daily as needed. Continue Elavil 50 mg nightly. Continue hydroxyzine 25 mg daily as needed. Continue Seroquel 400 mg, 2 p.o. nightly. Continue Zoloft 100 mg daily. Continue Topamax 100 mg twice daily. Return in 6 months.  Donnal Moat, PA-C   This record has been created using [employer].  Chart creation errors have been sought, but may not always have been located and corrected. Such creation errors do not reflect on the standard of medical care.

## 2019-02-26 NOTE — Progress Notes (Signed)
CC'D TO PCP °

## 2019-02-28 ENCOUNTER — Telehealth: Payer: Self-pay | Admitting: Physician Assistant

## 2019-02-28 NOTE — Telephone Encounter (Signed)
°  Patient would like to speak with you, her mother passed away and she's having a hard time would like something to take to keep her calm.

## 2019-02-28 NOTE — Telephone Encounter (Signed)
Would one of you please call her and let her know she can increase the xanax to tid prn.  If she needs RF or anything else, let me know. And tell her I'm sorry about her mom.

## 2019-02-28 NOTE — Telephone Encounter (Signed)
Left voicemail with detailed information and to call back

## 2019-03-01 ENCOUNTER — Ambulatory Visit: Payer: Medicare Other | Admitting: Gastroenterology

## 2019-03-30 DIAGNOSIS — Z1159 Encounter for screening for other viral diseases: Secondary | ICD-10-CM | POA: Diagnosis not present

## 2019-04-02 LAB — HEPATITIS C ANTIBODY
Hepatitis C Ab: NONREACTIVE
SIGNAL TO CUT-OFF: 0.01 (ref <1.00)

## 2019-04-04 NOTE — Progress Notes (Signed)
Pt is aware.  

## 2019-04-05 NOTE — Progress Notes (Signed)
CC'D TO PCP °

## 2019-05-28 ENCOUNTER — Ambulatory Visit: Payer: Self-pay | Admitting: Nutrition

## 2019-05-31 ENCOUNTER — Encounter: Payer: Self-pay | Admitting: Nutrition

## 2019-05-31 ENCOUNTER — Encounter: Payer: Medicare Other | Attending: Family Medicine | Admitting: Nutrition

## 2019-05-31 ENCOUNTER — Other Ambulatory Visit: Payer: Self-pay

## 2019-05-31 DIAGNOSIS — E782 Mixed hyperlipidemia: Secondary | ICD-10-CM | POA: Insufficient documentation

## 2019-05-31 DIAGNOSIS — E119 Type 2 diabetes mellitus without complications: Secondary | ICD-10-CM | POA: Insufficient documentation

## 2019-05-31 DIAGNOSIS — E669 Obesity, unspecified: Secondary | ICD-10-CM | POA: Insufficient documentation

## 2019-05-31 DIAGNOSIS — IMO0002 Reserved for concepts with insufficient information to code with codable children: Secondary | ICD-10-CM

## 2019-05-31 DIAGNOSIS — E1165 Type 2 diabetes mellitus with hyperglycemia: Secondary | ICD-10-CM | POA: Insufficient documentation

## 2019-05-31 DIAGNOSIS — E118 Type 2 diabetes mellitus with unspecified complications: Secondary | ICD-10-CM | POA: Insufficient documentation

## 2019-05-31 NOTE — Progress Notes (Signed)
Telephone follow up visit Medical Nutrition Therapy:  Appt start time:1500  end time:  5176.   Assessment:  Primary concerns today: Diabetes Type 2. PMH: Hyperlipidemia, HTN, Obesity and CHF. LIves with her husband. She lost her mom recently and  Is stressed going through her stuff. FBS 130-140's  Metformin 500 mg BID. BS are much better. Changed-working on losing.wieght. Has lost 20 lbs in the last 3 months. Wt down to 200 lbs. Usually she is eating more fresh fruits and vegetables . Has been avoiding snacks.Drinking water. Her PCP adjusted her anxiety meds of Xanax due to recent loss of her mom. Depression issues may be interferingly with her self care.   Goals set previously  Increase exercise as tolerated-Working on it. Lose 1 lb per week.- work in [employer] FBS Less than 130 mg/dl.- improved and almost there. Keep drinking water  Avoid processed and fast foods.   Wt Readings from Last 3 Encounters:  12/22/18 220 lb 9.6 oz (100.1 kg)  11/08/18 223 lb (101.2 kg)  09/07/18 223 lb (101.2 kg)   Ht Readings from Last 3 Encounters:  12/22/18 5' 2"  (1.575 m)  11/08/18 5' 2"  (1.575 m)  09/07/18 5' 2"  (1.575 m)   There is no height or weight on file to calculate BMI. @BMIFA @ Facility age limit for growth percentiles is 20 years. Facility age limit for growth percentiles is 20 years. CMP Latest Ref Rng & Units 05/17/2017 09/19/2016 07/28/2014  Glucose 65 - 99 mg/dL 239(H) 190(H) 127(H)  BUN 6 - 20 mg/dL 18 13 13   Creatinine 0.44 - 1.00 mg/dL 0.84 0.80 0.75  Sodium 135 - 145 mmol/L 140 140 143  Potassium 3.5 - 5.1 mmol/L 4.4 4.0 3.9  Chloride 101 - 111 mmol/L 108 104 108  CO2 22 - 32 mmol/L 25 - 25  Calcium 8.9 - 10.3 mg/dL 9.6 - 8.8  Total Protein 6.0 - 8.3 g/dL - - -  Total Bilirubin 0.3 - 1.2 mg/dL - - -  Alkaline Phos 39 - 117 U/L - - -  AST 0 - 37 U/L - - -  ALT 0 - 35 U/L - - -   Lipid Panel     Component Value Date/Time   CHOL 203 (H) 09/04/2013 1728   TRIG 185  (H) 09/04/2013 1728   HDL 59 09/04/2013 1728   LDLCALC 107 (H) 09/04/2013 1728     Preferred Learning Style:   Hands on  No preference indicated   Learning Readiness:   Ready  Change in progress   MEDICATIONS:  DIETARY INTAKE:  24-hr recall:  B ( AM):  2 boiled eggs, 1 slice of toast and 1 cup of cherrios with milk, Snk ( AM):   L ( PM)   Fish sandwich from [employer], water. Snk ( PM):  D ( PM): 6" cold cut sub, nectarine water Snk ( PM):water Beverages: water  Usual physical activity: ADL  Estimated energy needs: 1200  calories 125 g carbohydrates 90 g protein 33 g fat  Progress Towards Goal(s):  In progress.   Nutritional Diagnosis:  NB-1.1 Food and nutrition-related knowledge deficit As related to Diabetes.  As evidenced by A1C 8.3%.    Intervention:  Nutrition and Diabetes education provided on My Plate, CHO counting, meal planning, portion sizes, timing of meals, avoiding snacks between meals unless having a low blood sugar, target ranges for A1C and blood sugars, signs/symptoms and treatment of hyper/hypoglycemia, monitoring blood sugars, taking medications as prescribed, benefits of exercising 30  minutes per day and prevention of complications of DM. Goals  Increase exercise as tolerated-Working on it. Lose 1 lb per week.- work in [employer] FBS Less than 130 mg/dl.- improved and almost there. Keep drinking water  Avoid processed and fast foods. Get A1C less than 6,5%    Teaching Method Utilized:  Visual Auditory Hands on  Handouts given during visit include:  The Plate Method   Meal Plan   Barriers to learning/adherence to lifestyle change: none  Demonstrated degree of understanding via:  Teach Back   Monitoring/Evaluation:  Dietary intake, exercise, meal planning, and body weight in 1 month(s).

## 2019-05-31 NOTE — Patient Instructions (Signed)
Goals  Increase exercise as tolerated-Working on it. Lose 1 lb per week.- work in [employer] FBS Less than 130 mg/dl.- improved and almost there. Keep drinking water  Avoid processed and fast foods. Get A1C down to 6.5% or less.

## 2019-06-12 DIAGNOSIS — Z1389 Encounter for screening for other disorder: Secondary | ICD-10-CM | POA: Diagnosis not present

## 2019-06-12 DIAGNOSIS — E1165 Type 2 diabetes mellitus with hyperglycemia: Secondary | ICD-10-CM | POA: Diagnosis not present

## 2019-06-12 DIAGNOSIS — Z136 Encounter for screening for cardiovascular disorders: Secondary | ICD-10-CM | POA: Diagnosis not present

## 2019-06-12 DIAGNOSIS — Z0001 Encounter for general adult medical examination with abnormal findings: Secondary | ICD-10-CM | POA: Diagnosis not present

## 2019-06-12 DIAGNOSIS — I10 Essential (primary) hypertension: Secondary | ICD-10-CM | POA: Diagnosis not present

## 2019-06-12 DIAGNOSIS — Z01021 Encounter for examination of eyes and vision following failed vision screening with abnormal findings: Secondary | ICD-10-CM | POA: Diagnosis not present

## 2019-06-12 DIAGNOSIS — Z01118 Encounter for examination of ears and hearing with other abnormal findings: Secondary | ICD-10-CM | POA: Diagnosis not present

## 2019-06-12 DIAGNOSIS — H538 Other visual disturbances: Secondary | ICD-10-CM | POA: Diagnosis not present

## 2019-06-12 DIAGNOSIS — Z5181 Encounter for therapeutic drug level monitoring: Secondary | ICD-10-CM | POA: Diagnosis not present

## 2019-06-12 DIAGNOSIS — Z1329 Encounter for screening for other suspected endocrine disorder: Secondary | ICD-10-CM | POA: Diagnosis not present

## 2019-06-12 DIAGNOSIS — H9209 Otalgia, unspecified ear: Secondary | ICD-10-CM | POA: Diagnosis not present

## 2019-06-12 DIAGNOSIS — Z131 Encounter for screening for diabetes mellitus: Secondary | ICD-10-CM | POA: Diagnosis not present

## 2019-06-20 DIAGNOSIS — Z8249 Family history of ischemic heart disease and other diseases of the circulatory system: Secondary | ICD-10-CM | POA: Diagnosis not present

## 2019-06-20 DIAGNOSIS — E0921 Drug or chemical induced diabetes mellitus with diabetic nephropathy: Secondary | ICD-10-CM | POA: Diagnosis not present

## 2019-06-20 DIAGNOSIS — I10 Essential (primary) hypertension: Secondary | ICD-10-CM | POA: Diagnosis not present

## 2019-07-22 DIAGNOSIS — I10 Essential (primary) hypertension: Secondary | ICD-10-CM | POA: Diagnosis not present

## 2019-08-02 ENCOUNTER — Encounter: Payer: Self-pay | Admitting: Gastroenterology

## 2019-08-15 ENCOUNTER — Encounter: Payer: Medicare Other | Attending: Family Medicine | Admitting: Nutrition

## 2019-08-15 ENCOUNTER — Telehealth: Payer: Self-pay | Admitting: Gastroenterology

## 2019-08-15 ENCOUNTER — Encounter: Payer: Self-pay | Admitting: Nutrition

## 2019-08-15 ENCOUNTER — Other Ambulatory Visit: Payer: Self-pay

## 2019-08-15 DIAGNOSIS — I110 Hypertensive heart disease with heart failure: Secondary | ICD-10-CM | POA: Insufficient documentation

## 2019-08-15 DIAGNOSIS — I509 Heart failure, unspecified: Secondary | ICD-10-CM | POA: Insufficient documentation

## 2019-08-15 DIAGNOSIS — Z6836 Body mass index (BMI) 36.0-36.9, adult: Secondary | ICD-10-CM | POA: Diagnosis not present

## 2019-08-15 DIAGNOSIS — Z713 Dietary counseling and surveillance: Secondary | ICD-10-CM | POA: Insufficient documentation

## 2019-08-15 DIAGNOSIS — E119 Type 2 diabetes mellitus without complications: Secondary | ICD-10-CM | POA: Diagnosis not present

## 2019-08-15 DIAGNOSIS — Z7984 Long term (current) use of oral hypoglycemic drugs: Secondary | ICD-10-CM | POA: Insufficient documentation

## 2019-08-15 NOTE — Telephone Encounter (Signed)
Referral placed. Patient was orginally referred by [place] 2019

## 2019-08-15 NOTE — Patient Instructions (Signed)
Goals  Keep up the good job. Try not to eat past 7 pm Goal FBS is less than 130 mg/dl. Continue to exercise by walking on treatmill Get A1C to 6% or less Lose 2-3 lbs per month-goal wt-- 180 lbs.

## 2019-08-15 NOTE — Addendum Note (Signed)
Addended by: Cheron Every on: 08/15/2019 11:42 AM   Modules accepted: Orders

## 2019-08-15 NOTE — Progress Notes (Signed)
Telephone follow up visit Medical Nutrition Therapy:  Appt start time: 1000  end time:  1030.  Assessment:  Primary concerns today: Diabetes Type 2. PMH: Hyperlipidemia, HTN, Obesity and CHF. . Walking on treadmill daily. Occasionally checks BS.. 130's. mg/dl in am.  Currently on Metformin 500 mg once a day. Gretchen Short has helped with losing her mom. Working on portion sizes. Cooking meals at home. She hasn't left the house due to [place]. Feels bettter and not eating out.   Wt Readings from Last 3 Encounters:  08/15/19 200 lb (90.7 kg)  12/22/18 220 lb 9.6 oz (100.1 kg)  11/08/18 223 lb (101.2 kg)   Ht Readings from Last 3 Encounters:  08/15/19 5' 2"  (1.575 m)  12/22/18 5' 2"  (1.575 m)  11/08/18 5' 2"  (1.575 m)   Body mass index is 36.58 kg/m. @BMIFA @ Facility age limit for growth percentiles is 20 years. Facility age limit for growth percentiles is 20 years. CMP Latest Ref Rng & Units 05/17/2017 09/19/2016 07/28/2014  Glucose 65 - 99 mg/dL 239(H) 190(H) 127(H)  BUN 6 - 20 mg/dL 18 13 13   Creatinine 0.44 - 1.00 mg/dL 0.84 0.80 0.75  Sodium 135 - 145 mmol/L 140 140 143  Potassium 3.5 - 5.1 mmol/L 4.4 4.0 3.9  Chloride 101 - 111 mmol/L 108 104 108  CO2 22 - 32 mmol/L 25 - 25  Calcium 8.9 - 10.3 mg/dL 9.6 - 8.8  Total Protein 6.0 - 8.3 g/dL - - -  Total Bilirubin 0.3 - 1.2 mg/dL - - -  Alkaline Phos 39 - 117 U/L - - -  AST 0 - 37 U/L - - -  ALT 0 - 35 U/L - - -   Lipid Panel     Component Value Date/Time   CHOL 203 (H) 09/04/2013 1728   TRIG 185 (H) 09/04/2013 1728   HDL 59 09/04/2013 1728   LDLCALC 107 (H) 09/04/2013 1728     Preferred Learning Style:   Hands on  No preference indicated   Learning Readiness:   Ready  Change in progress   MEDICATIONS:  DIETARY INTAKE:  24-hr recall:  B ( AM):  2 boiled eggs, 1 slice of toast and 1 cup of cherrios with milk, Snk ( AM):   L ( PM) cheese sandwich and tomato soup, water Snk ( PM):  D ( PM):  Steamed cabbage,  mashed potoatoe and ox tails. Snk ( PM):water Beverages: water  Usual physical activity: ADL  Estimated energy needs: 1200  calories 125 g carbohydrates 90 g protein 33 g fat  Progress Towards Goal(s):  In progress.   Nutritional Diagnosis:  NB-1.1 Food and nutrition-related knowledge deficit As related to Diabetes.  As evidenced by A1C 8.3%.    Intervention:  Nutrition and Diabetes education provided on My Plate, CHO counting, meal planning, portion sizes, timing of meals, avoiding snacks between meals unless having a low blood sugar, target ranges for A1C and blood sugars, signs/symptoms and treatment of hyper/hypoglycemia, monitoring blood sugars, taking medications as prescribed, benefits of exercising 30 minutes per day and prevention of complications of DM.   Goals  Keep up the good job. Try not to eat past 7 pm Goal FBS is less than 130 mg/dl. Continue to exercise by walking on treatmill Get A1C to 6% or less Lose 2-3 lbs per month-goal wt-- 180 lbs.   Teaching Method Utilized:  Visual Auditory Hands on  Handouts given during visit include:  The Plate Method   Meal Plan  Barriers to learning/adherence to lifestyle change: none  Demonstrated degree of understanding via:  Teach Back   Monitoring/Evaluation:  Dietary intake, exercise, meal planning, and body weight in 3 month(s).

## 2019-08-15 NOTE — Telephone Encounter (Signed)
[facility] CALLED AND NEEDS A NEW REFERRAL FOR THIS PATIENT FOR OBESITY 9*

## 2019-08-31 ENCOUNTER — Encounter: Payer: Self-pay | Admitting: Physician Assistant

## 2019-08-31 ENCOUNTER — Ambulatory Visit (INDEPENDENT_AMBULATORY_CARE_PROVIDER_SITE_OTHER): Payer: Medicare Other | Admitting: Physician Assistant

## 2019-08-31 ENCOUNTER — Other Ambulatory Visit: Payer: Self-pay

## 2019-08-31 DIAGNOSIS — F3175 Bipolar disorder, in partial remission, most recent episode depressed: Secondary | ICD-10-CM | POA: Diagnosis not present

## 2019-08-31 DIAGNOSIS — F411 Generalized anxiety disorder: Secondary | ICD-10-CM

## 2019-08-31 DIAGNOSIS — F4321 Adjustment disorder with depressed mood: Secondary | ICD-10-CM

## 2019-08-31 MED ORDER — QUETIAPINE FUMARATE 400 MG PO TABS: 800.0000 mg | ORAL_TABLET | Freq: Every day | ORAL | 1 refills | Status: DC

## 2019-08-31 MED ORDER — ALPRAZOLAM 0.5 MG PO TABS: 0.5000 mg | ORAL_TABLET | Freq: Three times a day (TID) | ORAL | 2 refills | Status: DC | PRN

## 2019-08-31 MED ORDER — SERTRALINE HCL 100 MG PO TABS: 100.0000 mg | ORAL_TABLET | Freq: Every day | ORAL | 1 refills | Status: DC

## 2019-08-31 NOTE — Progress Notes (Signed)
[facility]  Patient ID: Kathryn Olson,  MRN: [id number]  PCP: Emelda Fear, DO  Date of Evaluation: 08/31/2019 Time spent:15 minutes  Chief Complaint:  Chief Complaint    Anxiety; Depression; Follow-up     Virtual Visit via Telephone Note  I connected with patient by a video enabled telemedicine application or telephone, with their informed consent, and verified patient privacy and that I am speaking with the correct person using two identifiers.  I am private, in my office and the patient is at home.  I discussed the limitations, risks, security and privacy concerns of performing an evaluation and management service by telephone and the availability of in person appointments. I also discussed with the patient that there may be a patient responsible charge related to this service. The patient expressed understanding and agreed to proceed.   I discussed the assessment and treatment plan with the patient. The patient was provided an opportunity to ask questions and all were answered. The patient agreed with the plan and demonstrated an understanding of the instructions.   The patient was advised to call back or seek an in-person evaluation if the symptoms worsen or if the condition fails to improve as anticipated.  I provided 15 minutes of non-face-to-face time during this encounter.  HISTORY/CURRENT STATUS: HPI patient here for routine med check.  Her Mom died in 03/19/2023.  It's been hard b/c of how her family has treated her.  They knew she was in the hospital since April and didn't tell her.  "I'm done w/ them.  I've told them not to call me or text me.  I don't want to have anything to do w/ them."  She has been a lot more anxious because of all this.  Occasionally has had to take Xanax 3 times a day instead of twice a day.  Not sleeping as well as she would like either.  Does not always use her CPAP, states she forgets.  Says it is hard to know whether the medicines are  working or not, but she does know that the Xanax does help when she takes it.  Patient denies increased energy with decreased need for sleep, no increased talkativeness, no racing thoughts, no impulsivity or risky behaviors, no increased spending, no increased libido, no grandiosity.  No increased irritability.  No hallucinations.  Denies dizziness, syncope, seizures, numbness, tingling, tremor, tics, unsteady gait, slurred speech, confusion.  Denies muscle or joint pain, stiffness, or dystonia.  Individual Medical History/ Review of Systems: Changes? :No    Past medications for mental health diagnoses include: Equetro, Seroquel, VPA, Xanax, Buspar  Allergies: Aspirin, Peanut oil, Peanut-containing drug products, Penicillins, Sulfa antibiotics, Sulfamethoxazole, Adhesive [tape], Codeine, Latex, and Rubbing alcohol [alcohol]  Current Medications:  Current Outpatient Medications:  .  albuterol (PROVENTIL HFA;VENTOLIN HFA) 108 (90 Base) MCG/ACT inhaler, Inhale 2 puffs into the lungs every 6 (six) hours as needed for wheezing or shortness of breath., Disp: , Rfl:  .  albuterol (PROVENTIL) (5 MG/ML) 0.5% nebulizer solution, Take 2.5 mg by nebulization every 6 (six) hours as needed for wheezing or shortness of breath., Disp: , Rfl:  .  amitriptyline (ELAVIL) 50 MG tablet, Take 1 tablet by mouth daily., Disp: , Rfl:  .  diclofenac sodium (VOLTAREN) 1 % GEL, Apply 2 g topically 2 (two) times daily. Shoulder pain, Disp: , Rfl:  .  diphenhydrAMINE (BENADRYL) 25 MG tablet, Take 25 mg by mouth every 6 (six) hours as needed for itching or allergies., Disp: ,  Rfl:  .  EPINEPHrine 0.3 mg/0.3 mL IJ SOAJ injection, Inject 0.3 mg into the muscle as needed., Disp: , Rfl:  .  esomeprazole (NEXIUM) 40 MG capsule, 1 PO 30 MINUTES PRIOR TO MEALS BID, Disp: 60 capsule, Rfl: 11 .  famotidine (PEPCID) 20 MG tablet, Take 20 mg by mouth as needed. , Disp: , Rfl:  .  fluticasone (FLONASE) 50 MCG/ACT nasal spray, Place 1  spray into both nostrils 2 (two) times daily., Disp: , Rfl:  .  furosemide (LASIX) 40 MG tablet, Take 40 mg by mouth daily. , Disp: , Rfl:  .  guaifenesin (ROBITUSSIN) 100 MG/5ML syrup, Take 30 mLs by mouth 3 (three) times daily as needed for cough., Disp: , Rfl:  .  guaiFENesin-codeine (ROBITUSSIN AC) 100-10 MG/5ML syrup, Take 5 mLs by mouth 4 (four) times daily as needed for cough., Disp: , Rfl:  .  hydrocortisone 2.5 % ointment, APPLY 1 APPLICATION 4 TIMES A DAY, Disp: 40 g, Rfl: 1 .  hydrOXYzine (VISTARIL) 25 MG capsule, Take 25 mg by mouth daily as needed for itching., Disp: , Rfl:  .  levocetirizine (XYZAL) 5 MG tablet, Take 5 mg by mouth every evening., Disp: , Rfl:  .  linaclotide (LINZESS) 290 MCG CAPS capsule, Take 290 mcg by mouth daily., Disp: , Rfl:  .  metFORMIN (GLUCOPHAGE) 500 MG tablet, Take 500 mg by mouth as needed. , Disp: , Rfl:  .  QUEtiapine (SEROQUEL) 400 MG tablet, Take 2 tablets (800 mg total) by mouth at bedtime., Disp: 180 tablet, Rfl: 1 .  sertraline (ZOLOFT) 100 MG tablet, Take 1 tablet (100 mg total) by mouth daily., Disp: 90 tablet, Rfl: 1 .  UNABLE TO FIND, CPAP at bedtime, Disp: , Rfl:  .  ALPRAZolam (XANAX) 0.5 MG tablet, Take 1 tablet (0.5 mg total) by mouth 3 (three) times daily as needed for anxiety., Disp: 90 tablet, Rfl: 2 .  Bepotastine Besilate 1.5 % SOLN, Place 1 drop into both eyes 2 (two) times daily., Disp: , Rfl:  .  empagliflozin (JARDIANCE) 25 MG TABS tablet, Take 25 mg by mouth daily., Disp: , Rfl:  .  HYDROcodone-acetaminophen (NORCO) 10-325 MG tablet, Take 1 tablet by mouth every 6 (six) hours as needed for moderate pain or severe pain., Disp: , Rfl:  .  levofloxacin (LEVAQUIN) 500 MG tablet, Take 1 tablet (500 mg total) by mouth daily. (Patient not taking: Reported on 02/22/2019), Disp: 7 tablet, Rfl: 0 .  metoprolol tartrate (LOPRESSOR) 25 MG tablet, Take 25 mg by mouth 2 (two) times daily. , Disp: , Rfl:  .  Olopatadine HCl 0.2 % SOLN, Apply 2  drops to eye 2 (two) times daily., Disp: , Rfl:  .  OMALIZUMAB [place], Inject 150 Units into the skin every 14 (fourteen) days., Disp: , Rfl:  .  phentermine 37.5 MG capsule, Take 37.5 mg by mouth every morning., Disp: , Rfl:  .  prednisoLONE acetate (PRED FORTE) 1 % ophthalmic suspension, Place 1 drop into both eyes 4 (four) times daily., Disp: , Rfl:  .  ranitidine (ZANTAC) 150 MG tablet, Take 1 tablet by mouth daily., Disp: , Rfl:  .  topiramate (TOPAMAX) 100 MG tablet, Take 100 mg by mouth 2 (two) times daily., Disp: , Rfl:  Medication Side Effects: none  Family Medical/ Social History: Changes?  No  MENTAL HEALTH EXAM:  There were no vitals taken for this visit.There is no height or weight on file to calculate BMI.  General Appearance:  unable to assess  Eye Contact:  unable to assess  Speech:  Clear and Coherent  Volume:  Normal  Mood:  Euthymic  Affect:  unable to assess  Thought Process:  Goal Directed and Descriptions of Associations: Intact  Orientation:  Full (Time, Place, and Person)  Thought Content: Logical   Suicidal Thoughts:  No  Homicidal Thoughts:  No  Memory:  WNL  Judgement:  Good  Insight:  Good  Psychomotor Activity:  Unable to assess  Concentration:  Concentration: Good  Recall:  Good  Fund of Knowledge: Good  Language: Good  Assets:  Desire for Improvement  ADL's:  Intact  Cognition: WNL  Prognosis:  Good    DIAGNOSES:    ICD-10-CM   1. Bipolar disorder, in partial remission, most recent episode depressed ([place])  F31.75   2. Generalized anxiety disorder  F41.1   3. Grieving  F43.21     Receiving Psychotherapy: No    RECOMMENDATIONS: My condolences on the loss of her mom.   Increase Xanax 0.5 mg to 3 times daily as needed. PDMP was reviewed. Continue hydroxyzine 25 mg daily as needed. Continue Seroquel 400 mg, 2 p.o. nightly. Continue Zoloft 100 mg daily. Continue Topamax 100 mg twice daily. Return in 3 months.  Donnal Moat, PA-C

## 2019-09-12 ENCOUNTER — Ambulatory Visit: Payer: Medicare Other | Admitting: Gastroenterology

## 2019-10-24 ENCOUNTER — Ambulatory Visit (INDEPENDENT_AMBULATORY_CARE_PROVIDER_SITE_OTHER): Payer: Medicare HMO | Admitting: Gastroenterology

## 2019-10-24 ENCOUNTER — Other Ambulatory Visit: Payer: Self-pay

## 2019-10-24 ENCOUNTER — Encounter: Payer: Self-pay | Admitting: Gastroenterology

## 2019-10-24 DIAGNOSIS — K219 Gastro-esophageal reflux disease without esophagitis: Secondary | ICD-10-CM

## 2019-10-24 DIAGNOSIS — K581 Irritable bowel syndrome with constipation: Secondary | ICD-10-CM | POA: Diagnosis not present

## 2019-10-24 DIAGNOSIS — K5010 Crohn's disease of large intestine without complications: Secondary | ICD-10-CM

## 2019-10-24 NOTE — Assessment & Plan Note (Signed)
SYMPTOMS FAIRLY WELL CONTROLLED.  CONTINUE TO MONITOR SYMPTOMS. FOLLOW UP IN 6 MOS.

## 2019-10-24 NOTE — Assessment & Plan Note (Signed)
SYMPTOMS FAIRLY WELL CONTROLLED.  CONTINUE NEXIUM. USE PEPCID OR ZANTAC WHEN NEEDED FOR HEARTBURN. FOLLOW UP IN 6 MOS.

## 2019-10-24 NOTE — Progress Notes (Signed)
Subjective:    Patient ID: Kathryn Olson, female    DOB: 1961/08/27, 59 y.o.   MRN: [id number]  PT UNABLE TO COMPLETE DOXYME CALL. CHANGED TO TELEPHONE VISIT.  Primary Care Physician:  Emelda Fear, DO  Primary GI:  Barney Drain, MD   Patient Location: home   Provider Location: [facility] office   Reason for Visit: IBS/NAUSEA   Persons present on the virtual encounter, with roles: patient, myself (provider), MARTINA BOOTH CMA (update meds/allergies)   Total time (minutes) spent on medical discussion:  10 MINUTES   Due to COVID-19, visit was VIA TELEPHONE VISIT DUE TO COVID 19. VISIT IS CONDUCTED VIRTUALLY AND WAS REQUESTED BY PATIENT.   Virtual Visit via TELEPHONE   I connected with Korena Wallick and verified that I am speaking with the correct person using two identifiers.   I discussed the limitations, risks, security and privacy concerns of performing an evaluation and management service by telephone/video and the availability of in person appointments. I also discussed with the patient that there may be a patient responsible charge related to this service. The patient expressed understanding and agreed to proceed.  HPI PT DOING FAIRLY WELL. OCCASIONAL NAUSEA. BOWELS DOING PRETTY GOOD.  PT DENIES FEVER, CHILLS, HEMATOCHEZIA, HEMATEMESIS, nausea, vomiting, melena, diarrhea, CHEST PAIN, SHORTNESS OF BREATH,  CHANGE IN BOWEL IN HABITS, constipation, abdominal pain, problems swallowing, OR heartburn or indigestion.  Past Medical History:  Diagnosis Date  . Anxiety   . Asthma   . CHF (congestive heart failure) ([place])   . Chronic abdominal pain   . Chronic headaches   . Complication of anesthesia   . Crohn's disease [facility]) June 2013  . Environmental allergies   . Family history of anesthesia complication    MH during C Section  . H/O colonoscopy   . History of electroencephalogram 03/2014   normal  . IBS (irritable bowel syndrome) June 2014  . 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 Migraine   . Obsessive-compulsive disorder   . PTSD (post-traumatic stress disorder)   . Seizures ([place])     Past Surgical History:  Procedure Laterality Date  . BLADDER SURGERY     age 26  . CESAREAN SECTION     X2  . COLONOSCOPY  05/22/2012   SLF: Polyps, multiple hyperplastic in the sigmoid colon/Polyp in the rectum/  MODERATE Diverticulosis throughout the colon/ Internal hemorrhoids  . ESOPHAGOGASTRODUODENOSCOPY     remote, had ulcers, [place], Dr. Truddie Coco  . ESOPHAGOGASTRODUODENOSCOPY  05/22/2012   SLF: Stricture in the distal esophagus/Moderate gastritis  . PARTIAL HYSTERECTOMY    . TUBAL LIGATION     Allergies  Allergen Reactions  . Aspirin Anaphylaxis and Swelling  . Peanut Oil Anaphylaxis  . Peanut-Containing Drug Products Anaphylaxis       . Penicillins Anaphylaxis    Stops her heart.    . Sulfa Antibiotics Anaphylaxis  . Sulfamethoxazole Anaphylaxis and Itching  . Adhesive [Tape] Rash  . Codeine Itching and Rash  . Latex Rash  . Rubbing Alcohol [Alcohol] Rash   Current Outpatient Medications  Medication Sig    . albuterol (PROVENTIL HFA;VENTOLIN HFA) 108 (90 Base) MCG/ACT inhaler Inhale 2 puffs into the lungs every 6 (six) hours as needed for wheezing or shortness of breath.    Marland Kitchen albuterol (PROVENTIL) (5 MG/ML) 0.5% nebulizer solution Take 2.5 mg by nebulization every 6 (six) hours as needed for wheezing or shortness of breath.    Marland Kitchen  ALPRAZolam (XANAX) 0.5 MG tablet Take 1 tablet (0.5 mg total) by mouth 3 (three) times daily as needed for anxiety.    Marland Kitchen amitriptyline (ELAVIL) 50 MG tablet Take 1 tablet by mouth daily.    . Bepotastine Besilate 1.5 % SOLN Place 1 drop into both eyes 2 (two) times daily.    . diclofenac sodium (VOLTAREN) 1 % GEL Apply 2 g topically 2 (two) times daily. Shoulder pain    . diphenhydrAMINE (BENADRYL) 25 MG tablet Take 25 mg by mouth every 6 (six) hours as needed for itching or  allergies.    Marland Kitchen empagliflozin (JARDIANCE) 25 MG TABS tablet Take 25 mg by mouth daily.    Marland Kitchen EPINEPHrine 0.3 mg/0.3 mL IJ SOAJ injection Inject 0.3 mg into the muscle as needed.    Marland Kitchen esomeprazole (NEXIUM) 40 MG capsule 1 PO 30 MINUTES PRIOR TO MEALS BID    . famotidine (PEPCID) 20 MG tablet Take 20 mg by mouth as needed.     . furosemide (LASIX) 40 MG tablet Take 40 mg by mouth daily.     Marland Kitchen guaifenesin (ROBITUSSIN) 100 MG/5ML syrup Take 30 mLs by mouth 3 (three) times daily as needed for cough.    Marland Kitchen guaiFENesin-codeine (ROBITUSSIN AC) 100-10 MG/5ML syrup Take 5 mLs by mouth 4 (four) times daily as needed for cough.    Marland Kitchen HYDROcodone-acetaminophen (NORCO) 10-325 MG tablet Take 1 tablet by mouth every 6 (six) hours as needed for moderate pain or severe pain.    . hydrocortisone 2.5 % ointment APPLY 1 APPLICATION 4 TIMES A DAY    . hydrOXYzine (VISTARIL) 25 MG capsule Take 25 mg by mouth daily as needed for itching.    . levocetirizine (XYZAL) 5 MG tablet Take 5 mg by mouth every evening.    . linaclotide (LINZESS) 290 MCG CAPS capsule Take 290 mcg by mouth daily.    . metFORMIN (GLUCOPHAGE) 500 MG tablet Take 500 mg by mouth as needed.     . Olopatadine HCl 0.2 % SOLN Apply 2 drops to eye 2 (two) times daily.    . phentermine 37.5 MG capsule Take 37.5 mg by mouth every morning.    Marland Kitchen QUEtiapine (SEROQUEL) 400 MG tablet Take 2 tablets (800 mg total) by mouth at bedtime.    . ranitidine (ZANTAC) 150 MG tablet Take 1 tablet by mouth daily.    . sertraline (ZOLOFT) 100 MG tablet Take 1 tablet (100 mg total) by mouth daily.    Marland Kitchen topiramate (TOPAMAX) 100 MG tablet Take 100 mg by mouth 2 (two) times daily.    Marland Kitchen UNABLE TO FIND CPAP at bedtime    . fluticasone (FLONASE) 50 MCG/ACT nasal spray Place 1 spray into both nostrils 2 (two) times daily.    . metoprolol tartrate (LOPRESSOR) 25 MG tablet Take 25 mg by mouth 2 (two) times daily.     . OMALIZUMAB [place] Inject 150 Units into the skin every 14 (fourteen)  days.    . prednisoLONE acetate (PRED FORTE) 1 % ophthalmic suspension Place 1 drop into both eyes 4 (four) times daily.     Review of Systems PER HPI OTHERWISE ALL SYSTEMS ARE NEGATIVE.    Objective:   Physical Exam   TELEPHONE VISIT DUE TO COVID 19, VISIT IS CONDUCTED VIRTUALLY AND WAS REQUESTED BY PATIENT.     Assessment & Plan:

## 2019-10-24 NOTE — Patient Instructions (Addendum)
CONTINUE NEXIUM.  USE PEPCID OR ZANTAC WHEN NEEDED FOR HEARTBURN.  CONTINUE LINZESS.  FOLLOW UP IN 6 MOS.

## 2019-10-24 NOTE — Assessment & Plan Note (Signed)
SYMPTOMS CONTROLLED/RESOLVED.  CONTINUE TO MONITOR SYMPTOMS. FOLLOW UP IN 6 MOS.  

## 2019-10-25 NOTE — Progress Notes (Signed)
Cc'ed to pcp °

## 2019-10-30 ENCOUNTER — Other Ambulatory Visit: Payer: Self-pay | Admitting: Family Medicine

## 2019-10-30 DIAGNOSIS — Z1231 Encounter for screening mammogram for malignant neoplasm of breast: Secondary | ICD-10-CM

## 2019-10-31 ENCOUNTER — Ambulatory Visit: Payer: Medicare Other | Admitting: Nutrition

## 2019-10-31 ENCOUNTER — Other Ambulatory Visit: Payer: Self-pay

## 2019-10-31 ENCOUNTER — Encounter: Payer: Medicare HMO | Attending: Gastroenterology | Admitting: Nutrition

## 2019-10-31 ENCOUNTER — Encounter: Payer: Self-pay | Admitting: Nutrition

## 2019-10-31 DIAGNOSIS — E119 Type 2 diabetes mellitus without complications: Secondary | ICD-10-CM | POA: Diagnosis not present

## 2019-10-31 DIAGNOSIS — E669 Obesity, unspecified: Secondary | ICD-10-CM | POA: Diagnosis present

## 2019-10-31 DIAGNOSIS — E782 Mixed hyperlipidemia: Secondary | ICD-10-CM | POA: Diagnosis present

## 2019-10-31 DIAGNOSIS — I10 Essential (primary) hypertension: Secondary | ICD-10-CM | POA: Diagnosis present

## 2019-10-31 NOTE — Patient Instructions (Addendum)
Goals  Don't skip meals Increase walking on treadmill. Increase fresh fruits and vegetables. Keep drinking water Lose 2 lbs per month. Schedule follow up appt with Dr. Chauncey Reading.  COVID-19 Vaccine Information can be found at: ShippingScam.co.uk For questions related to vaccine distribution or appointments, please email [email or web address] or call [phone].

## 2019-10-31 NOTE — Progress Notes (Signed)
Telephone follow up visit Medical Nutrition Therapy:  Appt start time: 1000  end time:  1030.  Assessment:  Primary concerns today: Diabetes Type 2. PMH: Hyperlipidemia, HTN, Obesity and CHF. No new labs. Not really checking blood sugars. Metformin 500 mg a day. Walking on treadmilll 30 minutes BID. NO current weight. She thinks she has gained some weight. Cooking meals at home. She hasn't left the house due to [place]. Feels bettter and not eating out.    Wt Readings from Last 3 Encounters:  08/15/19 200 lb (90.7 kg)  12/22/18 220 lb 9.6 oz (100.1 kg)  11/08/18 223 lb (101.2 kg)   Ht Readings from Last 3 Encounters:  08/15/19 _0  (1.575 m)  12/22/18 _1  (1.575 m)  11/08/18 _2  (1.575 m)   There is no height or weight on file to calculate BMI. _3 @ Facility age limit for growth percentiles is 20 years. Facility age limit for growth percentiles is 20 years. CMP Latest Ref Rng & Units 05/17/2017 09/19/2016 07/28/2014  Glucose 65 - 99 mg/dL 239(H) 190(H) 127(H)  BUN 6 - 20 mg/dL _4 Creatinine 0.44 - 1.00 mg/dL 0.84 0.80 0.75  Sodium 135 - 145 mmol/L 140 140 143  Potassium 3.5 - 5.1 mmol/L 4.4 4.0 3.9  Chloride 101 - 111 mmol/L 108 104 108  CO2 22 - 32 mmol/L 25 - 25  Calcium 8.9 - 10.3 mg/dL 9.6 - 8.8  Total Protein 6.0 - 8.3 g/dL - - -  Total Bilirubin 0.3 - 1.2 mg/dL - - -  Alkaline Phos 39 - 117 U/L - - -  AST 0 - 37 U/L - - -  ALT 0 - 35 U/L - - -   Lipid Panel     Component Value Date/Time   CHOL 203 (H) 09/04/2013 1728   TRIG 185 (H) 09/04/2013 1728   HDL 59 09/04/2013 1728   LDLCALC 107 (H) 09/04/2013 1728     Preferred Learning Style:   Hands on  No preference indicated   Learning Readiness:   Ready  Change in progress   MEDICATIONS:  DIETARY INTAKE:  24-hr recall:  B ( AM):cheese toast. Snk ( AM):   L ( PM) skipped lunch/ Snk ( PM):  D ( PM):  collards [place] deviled eggs and mac/cheese,  Water Snk ( PM):water Beverages:  water  Usual physical activity: ADL  Estimated energy needs: 1200  calories 125 g carbohydrates 90 g protein 33 g fat  Progress Towards Goal(s):  In progress.   Nutritional Diagnosis:  NB-1.1 Food and nutrition-related knowledge deficit As related to Diabetes.  As evidenced by A1C 8.3%.    Intervention:  Nutrition and Diabetes education provided on My Plate, CHO counting, meal planning, portion sizes, timing of meals, avoiding snacks between meals unless having a low blood sugar, target ranges for A1C and blood sugars, signs/symptoms and treatment of hyper/hypoglycemia, monitoring blood sugars, taking medications as prescribed, benefits of exercising 30 minutes per day and prevention of complications of DM.   Goals  Don't skip meals Increase walking on treadmill. Increase fresh fruits and vegetables. Keep drinking water Lose 2 lbs per month. Schedule follow up appt with Dr. Chauncey Reading.  Teaching Method Utilized:  Visual Auditory Hands on  Handouts given during visit include:  The Plate Method   Meal Plan   Barriers to learning/adherence to lifestyle change: none  Demonstrated degree of understanding via:  Teach Back   Monitoring/Evaluation:  Dietary intake, exercise, meal planning,  and body weight in 3 month(s). COVID-19 Vaccine Information can be found at: ShippingScam.co.uk For questions related to vaccine distribution or appointments, please email [email or web address] or call [phone].

## 2019-11-30 ENCOUNTER — Ambulatory Visit: Payer: Medicare Other | Admitting: Physician Assistant

## 2019-12-06 ENCOUNTER — Ambulatory Visit: Payer: Medicare HMO

## 2019-12-20 ENCOUNTER — Ambulatory Visit (INDEPENDENT_AMBULATORY_CARE_PROVIDER_SITE_OTHER): Payer: Medicare HMO | Admitting: Physician Assistant

## 2019-12-20 ENCOUNTER — Encounter: Payer: Self-pay | Admitting: Physician Assistant

## 2019-12-20 DIAGNOSIS — F411 Generalized anxiety disorder: Secondary | ICD-10-CM | POA: Diagnosis not present

## 2019-12-20 DIAGNOSIS — G4733 Obstructive sleep apnea (adult) (pediatric): Secondary | ICD-10-CM | POA: Diagnosis not present

## 2019-12-20 DIAGNOSIS — F3175 Bipolar disorder, in partial remission, most recent episode depressed: Secondary | ICD-10-CM | POA: Diagnosis not present

## 2019-12-20 MED ORDER — QUETIAPINE FUMARATE 400 MG PO TABS: 800.0000 mg | ORAL_TABLET | Freq: Every day | ORAL | 1 refills | Status: DC

## 2019-12-20 MED ORDER — SERTRALINE HCL 100 MG PO TABS: 150.0000 mg | ORAL_TABLET | Freq: Every day | ORAL | 1 refills | Status: DC

## 2019-12-20 MED ORDER — ALPRAZOLAM 0.5 MG PO TABS: 0.5000 mg | ORAL_TABLET | Freq: Three times a day (TID) | ORAL | 2 refills | Status: DC | PRN

## 2019-12-20 NOTE — Progress Notes (Signed)
[facility]  Patient ID: Kathryn Olson,  MRN: [id number]  PCP: Emelda Fear, DO  Date of Evaluation: 12/20/2019 Time spent:20 minutes  Chief Complaint:  Chief Complaint    Anxiety; Depression     Virtual Visit via Telephone Note  I connected with patient by a video enabled telemedicine application or telephone, with their informed consent, and verified patient privacy and that I am speaking with the correct person using two identifiers.  I am private, in my office and the patient is home.   I discussed the limitations, risks, security and privacy concerns of performing an evaluation and management service by telephone and the availability of in person appointments. I also discussed with the patient that there may be a patient responsible charge related to this service. The patient expressed understanding and agreed to proceed.   I discussed the assessment and treatment plan with the patient. The patient was provided an opportunity to ask questions and all were answered. The patient agreed with the plan and demonstrated an understanding of the instructions.   The patient was advised to call back or seek an in-person evaluation if the symptoms worsen or if the condition fails to improve as anticipated.  I provided 20 minutes of non-face-to-face time during this encounter.  HISTORY/CURRENT STATUS: HPI For routine med check.  Her Mom passed away last 03/29/23. "I know she's in a better place." Difficult situation with her siblings.  Not talking unless she wants to talk with them.  More depressed.  Only sleeping about 3-4 hours a night even though she uses a CPAP.  Not bathing daily. Cries easily, doesn't want to get out of bed some days.  Her dtr in [place] picked her up and she is going to stay with her this weekend.  She enjoys being with her and her grandson.  Denies suicidal or homicidal thoughts.  No increased energy with decreased need for sleep.  No impulsivity, risky  behavior, increased spending or libido, no grandiosity, no grandiosity, no increased irritability or anger, no hallucinations.  She still gets anxious at times.  At the last visit, we increased the Xanax and that has been helpful.  She does have full-blown panic attacks a couple of times a week relieved with Xanax.  Denies dizziness, syncope, seizures, numbness, tingling, tremor, tics, unsteady gait, slurred speech, confusion. Denies muscle or joint pain, stiffness, or dystonia.  Individual Medical History/ Review of Systems: Changes? :No    Past medications for mental health diagnoses include: Equetro, Seroquel, VPA, Xanax, Buspar  Allergies: Aspirin, Peanut oil, Peanut-containing drug products, Penicillins, Sulfa antibiotics, Sulfamethoxazole, Adhesive [tape], Codeine, Latex, and Rubbing alcohol [alcohol]  Current Medications:  Current Outpatient Medications:  .  albuterol (PROVENTIL HFA;VENTOLIN HFA) 108 (90 Base) MCG/ACT inhaler, Inhale 2 puffs into the lungs every 6 (six) hours as needed for wheezing or shortness of breath., Disp: , Rfl:  .  albuterol (PROVENTIL) (5 MG/ML) 0.5% nebulizer solution, Take 2.5 mg by nebulization every 6 (six) hours as needed for wheezing or shortness of breath., Disp: , Rfl:  .  ALPRAZolam (XANAX) 0.5 MG tablet, Take 1 tablet (0.5 mg total) by mouth 3 (three) times daily as needed for anxiety., Disp: 90 tablet, Rfl: 2 .  amitriptyline (ELAVIL) 50 MG tablet, Take 1 tablet by mouth daily., Disp: , Rfl:  .  diclofenac sodium (VOLTAREN) 1 % GEL, Apply 2 g topically 2 (two) times daily. Shoulder pain, Disp: , Rfl:  .  diphenhydrAMINE (BENADRYL) 25 MG tablet, Take 25  mg by mouth every 6 (six) hours as needed for itching or allergies., Disp: , Rfl:  .  EPINEPHrine 0.3 mg/0.3 mL IJ SOAJ injection, Inject 0.3 mg into the muscle as needed., Disp: , Rfl:  .  esomeprazole (NEXIUM) 40 MG capsule, 1 PO 30 MINUTES PRIOR TO MEALS BID, Disp: 60 capsule, Rfl: 11 .  famotidine  (PEPCID) 20 MG tablet, Take 20 mg by mouth as needed. , Disp: , Rfl:  .  furosemide (LASIX) 40 MG tablet, Take 40 mg by mouth daily. , Disp: , Rfl:  .  guaiFENesin-codeine (ROBITUSSIN AC) 100-10 MG/5ML syrup, Take 5 mLs by mouth 4 (four) times daily as needed for cough., Disp: , Rfl:  .  hydrocortisone 2.5 % ointment, APPLY 1 APPLICATION 4 TIMES A DAY, Disp: 40 g, Rfl: 1 .  hydrOXYzine (VISTARIL) 25 MG capsule, Take 25 mg by mouth daily as needed for itching., Disp: , Rfl:  .  levocetirizine (XYZAL) 5 MG tablet, Take 5 mg by mouth every evening., Disp: , Rfl:  .  linaclotide (LINZESS) 290 MCG CAPS capsule, Take 290 mcg by mouth daily., Disp: , Rfl:  .  metFORMIN (GLUCOPHAGE) 500 MG tablet, Take 500 mg by mouth as needed. , Disp: , Rfl:  .  OMALIZUMAB [place], Inject 150 Units into the skin every 14 (fourteen) days., Disp: , Rfl:  .  QUEtiapine (SEROQUEL) 400 MG tablet, Take 2 tablets (800 mg total) by mouth at bedtime., Disp: 180 tablet, Rfl: 1 .  sertraline (ZOLOFT) 100 MG tablet, Take 1.5 tablets (150 mg total) by mouth daily., Disp: 135 tablet, Rfl: 1 .  UNABLE TO FIND, CPAP at bedtime, Disp: , Rfl:  .  Bepotastine Besilate 1.5 % SOLN, Place 1 drop into both eyes 2 (two) times daily., Disp: , Rfl:  .  empagliflozin (JARDIANCE) 25 MG TABS tablet, Take 25 mg by mouth daily., Disp: , Rfl:  .  fluticasone (FLONASE) 50 MCG/ACT nasal spray, Place 1 spray into both nostrils 2 (two) times daily., Disp: , Rfl:  .  guaifenesin (ROBITUSSIN) 100 MG/5ML syrup, Take 30 mLs by mouth 3 (three) times daily as needed for cough., Disp: , Rfl:  .  HYDROcodone-acetaminophen (NORCO) 10-325 MG tablet, Take 1 tablet by mouth every 6 (six) hours as needed for moderate pain or severe pain., Disp: , Rfl:  .  metoprolol tartrate (LOPRESSOR) 25 MG tablet, Take 25 mg by mouth 2 (two) times daily. , Disp: , Rfl:  .  Olopatadine HCl 0.2 % SOLN, Apply 2 drops to eye 2 (two) times daily., Disp: , Rfl:  .  phentermine 37.5 MG  capsule, Take 37.5 mg by mouth every morning., Disp: , Rfl:  .  prednisoLONE acetate (PRED FORTE) 1 % ophthalmic suspension, Place 1 drop into both eyes 4 (four) times daily., Disp: , Rfl:  .  topiramate (TOPAMAX) 100 MG tablet, Take 100 mg by mouth 2 (two) times daily., Disp: , Rfl:  Medication Side Effects: none  Family Medical/ Social History: Changes? No  MENTAL HEALTH EXAM:  There were no vitals taken for this visit.There is no height or weight on file to calculate BMI.  General Appearance: unable to assess  Eye Contact:  unable to assess  Speech:  Clear and Coherent  Volume:  Normal  Mood:  Euthymic  Affect:  unable to assess  Thought Process:  Goal Directed and Descriptions of Associations: Intact  Orientation:  Full (Time, Place, and Person)  Thought Content: Logical   Suicidal Thoughts:  No  Homicidal Thoughts:  No  Memory:  WNL  Judgement:  Good  Insight:  Good  Psychomotor Activity:  unable to assess  Concentration:  Concentration: Good  Recall:  Good  Fund of Knowledge: Good  Language: Good  Assets:  Desire for Improvement  ADL's:  Intact  Cognition: WNL  Prognosis:  Good    DIAGNOSES:    ICD-10-CM   1. Bipolar disorder, in partial remission, most recent episode depressed ([place])  F31.75   2. Obstructive sleep apnea  G47.33   3. Generalized anxiety disorder  F41.1     Receiving Psychotherapy: No    RECOMMENDATIONS:  PDMP was reviewed. I spent 20 minutes with her. Increase Zoloft 100 mg, to 1.5 pills daily.  This will help with the depression and anxiety. Continue Xanax 0.5 mg, 1 p.o. 3 times daily. Continue amitriptyline 50 mg nightly. Continue Seroquel 400 mg, 2 p.o. nightly. Sleep hygiene discussed. Consider counseling. Return in 6 weeks.  Donnal Moat, PA-C

## 2019-12-31 ENCOUNTER — Encounter: Payer: Self-pay | Admitting: Cardiology

## 2019-12-31 ENCOUNTER — Other Ambulatory Visit: Payer: Self-pay

## 2019-12-31 ENCOUNTER — Ambulatory Visit: Payer: Medicare HMO | Admitting: Cardiology

## 2019-12-31 VITALS — BP 144/72 | HR 84 | Ht 62.0 in | Wt 214.0 lb

## 2019-12-31 DIAGNOSIS — Z79899 Other long term (current) drug therapy: Secondary | ICD-10-CM | POA: Diagnosis not present

## 2019-12-31 DIAGNOSIS — I10 Essential (primary) hypertension: Secondary | ICD-10-CM | POA: Diagnosis not present

## 2019-12-31 MED ORDER — LOSARTAN POTASSIUM 25 MG PO TABS: 25.0000 mg | ORAL_TABLET | Freq: Every day | ORAL | 6 refills | Status: DC

## 2019-12-31 MED ORDER — CARVEDILOL 6.25 MG PO TABS: 6.2500 mg | ORAL_TABLET | Freq: Two times a day (BID) | ORAL | 6 refills | Status: DC

## 2019-12-31 NOTE — Patient Instructions (Signed)
Medication Instructions:  Your physician has recommended you make the following change in your medication:  1. STOP Metoprolol 2. START Carvedilol (Coreg) 6.25 mg twice a day 3. START Losartan 25 mg once daily  *If you need a refill on your cardiac medications before your next appointment, please call your pharmacy*   Lab Work: Today: BMET If you have labs (blood work) drawn today and your tests are completely normal, you will receive your results only by: Marland Kitchen MyChart Message (if you have MyChart) OR . A paper copy in the mail If you have any lab test that is abnormal or we need to change your treatment, we will call you to review the results.   Testing/Procedures: None ordered   Follow-Up: At [facility], you and your health needs are our priority.  As part of our continuing mission to provide you with exceptional heart care, we have created designated Provider Care Teams.  These Care Teams include your primary Cardiologist (physician) and Advanced Practice Providers (APPs -  Physician Assistants and Nurse Practitioners) who all work together to provide you with the care you need, when you need it.  Your next appointment:   6 month(s)  The format for your next appointment:   In Person  Provider:   Allegra Lai, MD   Thank you for choosing [facility]!!   Trinidad Curet, RN [phone]    Other Instructions  Carvedilol Tablets What is this medicine? CARVEDILOL (KAR ve dil ol) is a beta blocker. It decreases the amount of work your heart has to do and helps your heart beat regularly. It treats high blood pressure. This medicine may be used for other purposes; ask your health care provider or pharmacist if you have questions. COMMON BRAND NAME(S): Coreg What should I tell my health care provider before I take this medicine? They need to know if you have any of these conditions:  circulation problems  diabetes  history of heart attack or heart  disease  liver disease  lung or breathing disease, like asthma or emphysema  pheochromocytoma  slow or irregular heartbeat  thyroid disease  an unusual or allergic reaction to carvedilol, other beta-blockers, medicines, foods, dyes, or preservatives  pregnant or trying to get pregnant  breast-feeding How should I use this medicine? Take this drug by mouth. Take it as directed on the prescription label at the same time every day. Take it with food. Keep taking it unless your health care provider tells you to stop. Talk to your health care provider about the use of this drug in children. Special care may be needed. Overdosage: If you think you have taken too much of this medicine contact a poison control center or emergency room at once. NOTE: This medicine is only for you. Do not share this medicine with others. What if I miss a dose? If you miss a dose, take it as soon as you can. If it is almost time for your next dose, take only that dose. Do not take double or extra doses. What may interact with this medicine? This medicine may interact with the following medications:  certain medicines for blood pressure, heart disease, irregular heart beat  certain medicines for depression, like fluoxetine or paroxetine  certain medicines for diabetes, like glipizide or glyburide  cimetidine  clonidine  cyclosporine  digoxin  MAOIs like Carbex, Eldepryl, Marplan, Nardil, and Parnate  reserpine  rifampin This list may not describe all possible interactions. Give your health care provider a list  of all the medicines, herbs, non-prescription drugs, or dietary supplements you use. Also tell them if you smoke, drink alcohol, or use illegal drugs. Some items may interact with your medicine. What should I watch for while using this medicine? Check your heart rate and blood pressure regularly while you are taking this medicine. Ask your doctor or health care professional what your heart  rate and blood pressure should be, and when you should contact him or her. Do not stop taking this medicine suddenly. This could lead to serious heart-related effects. Contact your doctor or health care professional if you have difficulty breathing while taking this drug. Check your weight daily. Ask your doctor or health care professional when you should notify him/her of any weight gain. You may get drowsy or dizzy. Do not drive, use machinery, or do anything that requires mental alertness until you know how this medicine affects you. To reduce the risk of dizzy or fainting spells, do not sit or stand up quickly. Alcohol can make you more drowsy, and increase flushing and rapid heartbeats. Avoid alcoholic drinks. This medicine may increase blood sugar. Ask your healthcare provider if changes in diet or medicines are needed if you have diabetes. If you are going to have surgery, tell your doctor or health care professional that you are taking this medicine. What side effects may I notice from receiving this medicine? Side effects that you should report to your doctor or health care professional as soon as possible:  allergic reactions like skin rash, itching or hives, swelling of the face, lips, or tongue  breathing problems  dark urine  irregular heartbeat   signs and symptoms of high blood sugar such as being more thirsty or hungry or having to urinate more than normal. You may also feel very tired or have blurry vision.  swollen legs or ankles  vomiting  yellowing of the eyes or skin Side effects that usually do not require medical attention (report to your doctor or health care professional if they continue or are bothersome):  change in sex drive or performance  diarrhea  dry eyes (especially if wearing contact lenses)  dry, itching skin  headache  nausea  unusually tired This list may not describe all possible side effects. Call your doctor for medical advice about side  effects. You may report side effects to FDA at 1-800-FDA-1088. Where should I keep my medicine? Keep out of the reach of children and pets. Store at room temperature between 20 and 25 degrees C (68 and 77 degrees F). Protect from moisture. Keep the container tightly closed. Throw away any unused drug after the expiration date. NOTE: This sheet is a summary. It may not cover all possible information. If you have questions about this medicine, talk to your doctor, pharmacist, or health care provider.  2020 Elsevier/Gold Standard (2019-05-11 17:42:09)     Losartan Tablets What is this medicine? LOSARTAN (loe SAR tan) is an angiotensin II receptor blocker, also known as an ARB. It treats high blood pressure. It can slow kidney damage in some patients. It may also be used to lower the risk of stroke. This medicine may be used for other purposes; ask your health care provider or pharmacist if you have questions. COMMON BRAND NAME(S): Cozaar What should I tell my health care provider before I take this medicine? They need to know if you have any of these conditions:  heart failure  kidney or liver disease  an unusual or allergic reaction to losartan,  other medicines, foods, dyes, or preservatives  pregnant or trying to get pregnant  breast-feeding How should I use this medicine? Take this drug by mouth. Take it as directed on the prescription label at the same time every day. You can take it with or without food. If it upsets your stomach, take it with food. Keep taking it unless your health care provider tells you to stop. Talk to your health care provider about the use of this drug in children. While it may be prescribed for children as young as 6 for selected conditions, precautions do apply. Overdosage: If you think you have taken too much of this medicine contact a poison control center or emergency room at once. NOTE: This medicine is only for you. Do not share this medicine with  others. What if I miss a dose? If you miss a dose, take it as soon as you can. If it is almost time for your next dose, take only that dose. Do not take double or extra doses. What may interact with this medicine?  blood pressure medicines  diuretics, especially triamterene, spironolactone, or amiloride  fluconazole  NSAIDs, medicines for pain and inflammation, like ibuprofen or naproxen  potassium salts or potassium supplements  rifampin This list may not describe all possible interactions. Give your health care provider a list of all the medicines, herbs, non-prescription drugs, or dietary supplements you use. Also tell them if you smoke, drink alcohol, or use illegal drugs. Some items may interact with your medicine. What should I watch for while using this medicine? Visit your doctor or health care professional for regular checks on your progress. Check your blood pressure as directed. Ask your doctor or health care professional what your blood pressure should be and when you should contact him or her. Call your doctor or health care professional if you notice an irregular or fast heart beat. Women should inform their doctor if they wish to become pregnant or think they might be pregnant. There is a potential for serious side effects to an unborn child, particularly in the second or third trimester. Talk to your health care professional or pharmacist for more information. You may get drowsy or dizzy. Do not drive, use machinery, or do anything that needs mental alertness until you know how this drug affects you. Do not stand or sit up quickly, especially if you are an older patient. This reduces the risk of dizzy or fainting spells. Alcohol can make you more drowsy and dizzy. Avoid alcoholic drinks. Avoid salt substitutes unless you are told otherwise by your doctor or health care professional. Do not treat yourself for coughs, colds, or pain while you are taking this medicine without  asking your doctor or health care professional for advice. Some ingredients may increase your blood pressure. What side effects may I notice from receiving this medicine? Side effects that you should report to your doctor or health care professional as soon as possible:  confusion, dizziness, light headedness or fainting spells  decreased amount of urine passed  difficulty breathing or swallowing, hoarseness, or tightening of the throat  fast or irregular heart beat, palpitations, or chest pain  skin rash, itching  swelling of your face, lips, tongue, hands, or feet Side effects that usually do not require medical attention (report to your doctor or health care professional if they continue or are bothersome):  cough  decreased sexual function or desire  headache  nasal congestion or stuffiness  nausea or stomach pain  sore  or cramping muscles This list may not describe all possible side effects. Call your doctor for medical advice about side effects. You may report side effects to FDA at 1-800-FDA-1088. Where should I keep my medicine? Keep out of the reach of children and pets. Store at room temperature between 15 and 30 degrees C (59 and 86 degrees F). Protect from light. Keep the container tightly closed. Throw away any unused drug after the expiration date. NOTE: This sheet is a summary. It may not cover all possible information. If you have questions about this medicine, talk to your doctor, pharmacist, or health care provider.  2020 Elsevier/Gold Standard (2019-05-09 12:12:28)

## 2019-12-31 NOTE — Addendum Note (Signed)
Addended by: Stanton Kidney on: 12/31/2019 11:33 AM   Modules accepted: Orders

## 2019-12-31 NOTE — Progress Notes (Signed)
Electrophysiology Office Note   Date:  12/31/2019   ID:  Kathryn Olson, DOB 17-Nov-1960, MRN [id number]  PCP:  Emelda Fear, DO  Primary Electrophysiologist:  Constance Haw, MD    No chief complaint on file.    History of Present Illness: Kathryn Olson is a 59 y.o. female who presents today for electrophysiology evaluation.   She was admitted to the hospital in [place] in August with apparently an anaphylactic reaction. At that time she had an echocardiogram and told that she was in congestive heart failure. Prior to that, she says that she was having chest pain.  He had a nuclear stress test performed which showed no evidence of ischemia. She was having palpitations. She wore a 30 day monitor that showed no evidence of tachycardia arrhythmia. She presents today for follow-up.  Today, denies symptoms of palpitations, chest pain, shortness of breath, orthopnea, PND, lower extremity edema, claudication, dizziness, presyncope, syncope, bleeding, or neurologic sequela. The patient is tolerating medications without difficulties.  Overall she is doing fairly well.  Last week, her blood pressure spiked into the 170s to 190s.  She was having some headaches at that time.  Since that time she has done well.  Her blood pressure has come down nicely today, though is still elevated.  She has not noted any palpitations or chest pain.  Her only other complaint is that she has been having leg cramps.  This occurs at many times.  Can occur at rest or when she is walking on her treadmill.   Past Medical History:  Diagnosis Date  . Anxiety   . Asthma   . CHF (congestive heart failure) ([place])   . Chronic abdominal pain   . Chronic headaches   . Complication of anesthesia   . Crohn's disease [facility]) June 2013  . Environmental allergies   . Family history of anesthesia complication    MH during C Section  . H/O colonoscopy   . History of electroencephalogram 03/2014   normal  . IBS (irritable bowel  syndrome) June 2014  . 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 Migraine   . Obsessive-compulsive disorder   . PTSD (post-traumatic stress disorder)   . Seizures ([place])    Past Surgical History:  Procedure Laterality Date  . BLADDER SURGERY     age 37  . CESAREAN SECTION     X2  . COLONOSCOPY  05/22/2012   SLF: Polyps, multiple hyperplastic in the sigmoid colon/Polyp in the rectum/  MODERATE Diverticulosis throughout the colon/ Internal hemorrhoids  . ESOPHAGOGASTRODUODENOSCOPY     remote, had ulcers, [place], Dr. Truddie Coco  . ESOPHAGOGASTRODUODENOSCOPY  05/22/2012   SLF: Stricture in the distal esophagus/Moderate gastritis  . PARTIAL HYSTERECTOMY    . TUBAL LIGATION       Current Outpatient Medications  Medication Sig Dispense Refill  . albuterol (PROVENTIL HFA;VENTOLIN HFA) 108 (90 Base) MCG/ACT inhaler Inhale 2 puffs into the lungs every 6 (six) hours as needed for wheezing or shortness of breath.    Marland Kitchen albuterol (PROVENTIL) (5 MG/ML) 0.5% nebulizer solution Take 2.5 mg by nebulization every 6 (six) hours as needed for wheezing or shortness of breath.    . ALPRAZolam (XANAX) 0.5 MG tablet Take 1 tablet (0.5 mg total) by mouth 3 (three) times daily as needed for anxiety. 90 tablet 2  . amitriptyline (ELAVIL) 50 MG tablet Take 1 tablet by mouth daily.    . Bepotastine Besilate  1.5 % SOLN Place 1 drop into both eyes 2 (two) times daily.    . diclofenac sodium (VOLTAREN) 1 % GEL Apply 2 g topically 2 (two) times daily. Shoulder pain    . diphenhydrAMINE (BENADRYL) 25 MG tablet Take 25 mg by mouth every 6 (six) hours as needed for itching or allergies.    Marland Kitchen empagliflozin (JARDIANCE) 25 MG TABS tablet Take 25 mg by mouth daily.    Marland Kitchen EPINEPHrine 0.3 mg/0.3 mL IJ SOAJ injection Inject 0.3 mg into the muscle as needed.    Marland Kitchen esomeprazole (NEXIUM) 40 MG capsule 1 PO 30 MINUTES PRIOR TO MEALS BID 60 capsule 11  . famotidine (PEPCID) 20 MG tablet Take 20 mg  by mouth as needed.     . fluticasone (FLONASE) 50 MCG/ACT nasal spray Place 1 spray into both nostrils 2 (two) times daily.    . furosemide (LASIX) 40 MG tablet Take 40 mg by mouth daily.     Marland Kitchen guaifenesin (ROBITUSSIN) 100 MG/5ML syrup Take 30 mLs by mouth 3 (three) times daily as needed for cough.    Marland Kitchen guaiFENesin-codeine (ROBITUSSIN AC) 100-10 MG/5ML syrup Take 5 mLs by mouth 4 (four) times daily as needed for cough.    Marland Kitchen HYDROcodone-acetaminophen (NORCO) 10-325 MG tablet Take 1 tablet by mouth every 6 (six) hours as needed for moderate pain or severe pain.    . hydrocortisone 2.5 % ointment APPLY 1 APPLICATION 4 TIMES A DAY 40 g 1  . hydrOXYzine (VISTARIL) 25 MG capsule Take 25 mg by mouth daily as needed for itching.    . levocetirizine (XYZAL) 5 MG tablet Take 5 mg by mouth every evening.    . linaclotide (LINZESS) 290 MCG CAPS capsule Take 290 mcg by mouth daily.    . metFORMIN (GLUCOPHAGE) 500 MG tablet Take 500 mg by mouth as needed.     . metoprolol tartrate (LOPRESSOR) 25 MG tablet Take 25 mg by mouth 2 (two) times daily.     . Olopatadine HCl 0.2 % SOLN Apply 2 drops to eye 2 (two) times daily.    . OMALIZUMAB Bazine Inject 150 Units into the skin every 14 (fourteen) days.    . phentermine 37.5 MG capsule Take 37.5 mg by mouth every morning.    . prednisoLONE acetate (PRED FORTE) 1 % ophthalmic suspension Place 1 drop into both eyes 4 (four) times daily.    . QUEtiapine (SEROQUEL) 400 MG tablet Take 2 tablets (800 mg total) by mouth at bedtime. 180 tablet 1  . sertraline (ZOLOFT) 100 MG tablet Take 1.5 tablets (150 mg total) by mouth daily. 135 tablet 1  . topiramate (TOPAMAX) 100 MG tablet Take 100 mg by mouth 2 (two) times daily.    Marland Kitchen UNABLE TO FIND CPAP at bedtime    . omeprazole (PRILOSEC) 20 MG capsule Take 20 mg by mouth at bedtime.     No current facility-administered medications for this visit.    Allergies:   Aspirin, Peanut oil, Peanut-containing drug products, Penicillins,  Sulfa antibiotics, Sulfamethoxazole, Adhesive [tape], Codeine, Latex, and Rubbing alcohol [alcohol]   Social History:  The patient  reports that she quit smoking about 9 years ago. She has never used smokeless tobacco. She reports that she does not drink alcohol or use drugs.   Family History:  The patient's family history includes Alcohol abuse in her father; Anxiety disorder in her sister and sister; Bipolar disorder in her sister; Colon cancer in her maternal grandfather; Dementia in her mother;  Depression in her mother and sister; Diabetes in her mother; Heart attack in her father; Heart murmur in her son; Hypertension in her mother; Liver disease in her brother; Malignant hyperthermia in her daughter; OCD in her sister.    ROS:  Please see the history of present illness.   Otherwise, review of systems is positive for none.   All other systems are reviewed and negative.   PHYSICAL EXAM: VS:  BP (!) 144/72   Pulse 84   Ht 5' 2"  (1.575 m)   Wt 214 lb (97.1 kg)   BMI 39.14 kg/m  , BMI Body mass index is 39.14 kg/m. GEN: Well nourished, well developed, in no acute distress  HEENT: normal  Neck: no JVD, carotid bruits, or masses Cardiac: RRR; no murmurs, rubs, or gallops,no edema  Respiratory:  clear to auscultation bilaterally, normal work of breathing GI: soft, nontender, nondistended, + BS MS: no deformity or atrophy  Skin: warm and dry Neuro:  Strength and sensation are intact Psych: euthymic mood, full affect  EKG:  EKG is ordered today. Personal review of the ekg ordered shows sinus rhythm, rate 84  Recent Labs: No results found for requested labs within last 8760 hours.    Lipid Panel     Component Value Date/Time   CHOL 203 (H) 09/04/2013 1728   TRIG 185 (H) 09/04/2013 1728   HDL 59 09/04/2013 1728   LDLCALC 107 (H) 09/04/2013 1728     Wt Readings from Last 3 Encounters:  12/31/19 214 lb (97.1 kg)  08/15/19 200 lb (90.7 kg)  12/22/18 220 lb 9.6 oz (100.1 kg)       Other studies Reviewed: Additional studies/ records that were reviewed today include: Myoview 01/05/17  Nuclear stress EF: 55%.  There was no ST segment deviation noted during stress.  The study is normal.  The left ventricular ejection fraction is normal (55-65%).   1. EF 55%, normal wall motion.  2. No significant perfusion defect, no evidence for ischemia or infarction.  3. Normal study.    ASSESSMENT AND PLAN:  1.  Hypertension: Pressure is elevated today.  Is also elevated at home.  We Jonice Cerra thus stop her metoprolol and start her on carvedilol 6.25 mg.  We Quantel Mcinturff also add losartan 25 mg.  This can be titrated up as needed.  2. Chest pain: None at the moment.  3. Palpitations: Palpitations have improved with beta-blockers.  4. Reported congestive heart failure: Ejection fraction is normal.  No changes.  5.  Leg cramps: We Alpa Salvo check a basic metabolic today.  Current medicines are reviewed at length with the patient today.   The patient does not have concerns regarding her medicines.  The following changes were made today: Stop metoprolol, start losartan, carvedilol  Labs/ tests ordered today include:  Orders Placed This Encounter  Procedures  . EKG 12-Lead  . EKG 12-Lead     Disposition:   FU with Allahna Husband 6 month  Signed, Rainy Rothman Meredith Leeds, MD  12/31/2019 11:08 AM     [facility] [address] [place] [address] [phone] (office) [phone] (fax)

## 2020-01-01 ENCOUNTER — Other Ambulatory Visit: Payer: Self-pay | Admitting: Cardiology

## 2020-01-01 LAB — BASIC METABOLIC PANEL
BUN/Creatinine Ratio: 14 (ref 9–23)
BUN: 13 mg/dL (ref 6–24)
CO2: 23 mmol/L (ref 20–29)
Calcium: 10 mg/dL (ref 8.7–10.2)
Chloride: 97 mmol/L (ref 96–106)
Creatinine, Ser: 0.95 mg/dL (ref 0.57–1.00)
GFR calc Af Amer: 76 mL/min/{1.73_m2} (ref >59)
GFR calc non Af Amer: 66 mL/min/{1.73_m2} (ref >59)
Glucose: 421 mg/dL — ABNORMAL HIGH (ref 65–99)
Potassium: 5.2 mmol/L (ref 3.5–5.2)
Sodium: 136 mmol/L (ref 134–144)

## 2020-01-01 MED ORDER — LOSARTAN POTASSIUM 25 MG PO TABS: 25.0000 mg | ORAL_TABLET | Freq: Every day | ORAL | 11 refills | Status: DC

## 2020-01-01 MED ORDER — CARVEDILOL 6.25 MG PO TABS: 6.2500 mg | ORAL_TABLET | Freq: Two times a day (BID) | ORAL | 11 refills | Status: DC

## 2020-01-01 NOTE — Telephone Encounter (Signed)
Pt's medications were sent to pt's pharmacy as requested. Confirmation received.  

## 2020-01-01 NOTE — Telephone Encounter (Signed)
*  STAT* If patient is at the pharmacy, call can be transferred to refill team.   1. Which medications need to be refilled? (please list name of each medication and dose if known)  carvedilol (COREG) 6.25 MG tablet losartan (COZAAR) 25 MG tablet  2. Which pharmacy/location (including street and city if local pharmacy) is medication to be sent to? [id number] [place], [place] - [address]. Do they need a 30 day or 90 day supply? 30 day supply

## 2020-01-08 ENCOUNTER — Ambulatory Visit: Payer: Medicare HMO

## 2020-01-09 ENCOUNTER — Telehealth: Payer: Self-pay

## 2020-01-09 NOTE — Telephone Encounter (Signed)
Results released to mychart-lmtcb with any questions

## 2020-01-09 NOTE — Telephone Encounter (Signed)
-----   Message from Will Meredith Leeds, MD sent at 01/01/2020  3:44 PM EDT ----- Normal labs other than significantly elevated blood glucose.  She needs to discuss this with her primary physician.

## 2020-01-16 DIAGNOSIS — E782 Mixed hyperlipidemia: Secondary | ICD-10-CM | POA: Insufficient documentation

## 2020-01-16 DIAGNOSIS — J309 Allergic rhinitis, unspecified: Secondary | ICD-10-CM | POA: Insufficient documentation

## 2020-01-25 ENCOUNTER — Other Ambulatory Visit: Payer: Self-pay

## 2020-01-25 ENCOUNTER — Ambulatory Visit
Admission: RE | Admit: 2020-01-25 | Discharge: 2020-01-25 | Disposition: A | Discharge status: Home / Self Care | Payer: Medicare HMO | Source: Ambulatory Visit | Attending: Family Medicine | Admitting: Family Medicine

## 2020-01-25 ENCOUNTER — Ambulatory Visit: Payer: Medicare HMO

## 2020-01-25 DIAGNOSIS — Z1231 Encounter for screening mammogram for malignant neoplasm of breast: Secondary | ICD-10-CM

## 2020-01-31 ENCOUNTER — Encounter: Payer: Self-pay | Admitting: Physician Assistant

## 2020-01-31 ENCOUNTER — Other Ambulatory Visit: Payer: Self-pay

## 2020-01-31 ENCOUNTER — Ambulatory Visit (INDEPENDENT_AMBULATORY_CARE_PROVIDER_SITE_OTHER): Payer: Medicare HMO | Admitting: Physician Assistant

## 2020-01-31 DIAGNOSIS — F4321 Adjustment disorder with depressed mood: Secondary | ICD-10-CM

## 2020-01-31 DIAGNOSIS — F411 Generalized anxiety disorder: Secondary | ICD-10-CM

## 2020-01-31 DIAGNOSIS — G4733 Obstructive sleep apnea (adult) (pediatric): Secondary | ICD-10-CM

## 2020-01-31 DIAGNOSIS — F3175 Bipolar disorder, in partial remission, most recent episode depressed: Secondary | ICD-10-CM | POA: Diagnosis not present

## 2020-01-31 NOTE — Progress Notes (Signed)
[facility] Med Check  Patient ID: Kathryn Olson,  MRN: [id number]  PCP: Emelda Fear, DO  Date of Evaluation: 01/31/2020 Time spent:20 minutes  Chief Complaint:  Chief Complaint    Follow-up      HISTORY/CURRENT STATUS: HPI For routine med check.  At [place], we increased the Zoloft. Feels a lot better.  Is more able to enjoy things, energy and motivation are better.  She's doing better with her hygiene, taking care of her nails, putting make-up on. Not crying as much. She still likes to stay in her room, but has always been that way. "I have good days and bad days. Sometimes at night I'll cry because I miss my [place]. It's been a year next month since she died." Patient denies SI/HI.  Patient denies increased energy with decreased need for sleep, no increased talkativeness, no racing thoughts, no impulsivity or risky behaviors, no increased spending, no increased libido, no grandiosity, no increased irritability or anger, and no hallucinations.  Anxiety is well-controlled with the Xanax.  Not having as many panic attacks now.  Is sleeping better. Doesn't sleep but a couple of hours at night and takes a nap during the day. "I feel good though.  I don't need a lot of sleep."  Denies dizziness, syncope, seizures, numbness, tingling, tremor, tics, unsteady gait, slurred speech, confusion. Denies muscle or joint pain, stiffness, or dystonia.  Individual Medical History/ Review of Systems: Changes? :No    Past medications for mental health diagnoses include: Equetro, Seroquel, VPA, Xanax, Buspar  Allergies: Aspirin, Peanut oil, Peanut-containing drug products, Penicillins, Sulfa antibiotics, Sulfamethoxazole, Adhesive [tape], Codeine, Latex, and Rubbing alcohol [alcohol]  Current Medications:  Current Outpatient Medications:  .  albuterol (PROVENTIL HFA;VENTOLIN HFA) 108 (90 Base) MCG/ACT inhaler, Inhale 2 puffs into the lungs every 6 (six) hours as needed for wheezing or shortness of  breath., Disp: , Rfl:  .  albuterol (PROVENTIL) (5 MG/ML) 0.5% nebulizer solution, Take 2.5 mg by nebulization every 6 (six) hours as needed for wheezing or shortness of breath., Disp: , Rfl:  .  ALPRAZolam (XANAX) 0.5 MG tablet, Take 1 tablet (0.5 mg total) by mouth 3 (three) times daily as needed for anxiety., Disp: 90 tablet, Rfl: 2 .  carvedilol (COREG) 6.25 MG tablet, Take 1 tablet (6.25 mg total) by mouth 2 (two) times daily., Disp: 60 tablet, Rfl: 11 .  diphenhydrAMINE (BENADRYL) 25 MG tablet, Take 25 mg by mouth every 6 (six) hours as needed for itching or allergies., Disp: , Rfl:  .  EPINEPHrine 0.3 mg/0.3 mL IJ SOAJ injection, Inject 0.3 mg into the muscle as needed., Disp: , Rfl:  .  famotidine (PEPCID) 20 MG tablet, Take 20 mg by mouth as needed. , Disp: , Rfl:  .  furosemide (LASIX) 40 MG tablet, Take 40 mg by mouth daily. , Disp: , Rfl:  .  guaifenesin (ROBITUSSIN) 100 MG/5ML syrup, Take 30 mLs by mouth 3 (three) times daily as needed for cough., Disp: , Rfl:  .  hydrocortisone 2.5 % ointment, APPLY 1 APPLICATION 4 TIMES A DAY, Disp: 40 g, Rfl: 1 .  hydrOXYzine (VISTARIL) 25 MG capsule, Take 25 mg by mouth daily as needed for itching., Disp: , Rfl:  .  levocetirizine (XYZAL) 5 MG tablet, Take 5 mg by mouth every evening., Disp: , Rfl:  .  linaclotide (LINZESS) 290 MCG CAPS capsule, Take 290 mcg by mouth daily., Disp: , Rfl:  .  metFORMIN (GLUCOPHAGE) 500 MG tablet, Take 500 mg by mouth  as needed. , Disp: , Rfl:  .  omeprazole (PRILOSEC) 20 MG capsule, Take 20 mg by mouth at bedtime., Disp: , Rfl:  .  QUEtiapine (SEROQUEL) 400 MG tablet, Take 2 tablets (800 mg total) by mouth at bedtime., Disp: 180 tablet, Rfl: 1 .  sertraline (ZOLOFT) 100 MG tablet, Take 1.5 tablets (150 mg total) by mouth daily., Disp: 135 tablet, Rfl: 1 .  UNABLE TO FIND, CPAP at bedtime, Disp: , Rfl:  .  amitriptyline (ELAVIL) 50 MG tablet, Take 1 tablet by mouth daily., Disp: , Rfl:  .  Bepotastine Besilate 1.5 %  SOLN, Place 1 drop into both eyes 2 (two) times daily., Disp: , Rfl:  .  diclofenac sodium (VOLTAREN) 1 % GEL, Apply 2 g topically 2 (two) times daily. Shoulder pain, Disp: , Rfl:  .  empagliflozin (JARDIANCE) 25 MG TABS tablet, Take 25 mg by mouth daily., Disp: , Rfl:  .  esomeprazole (NEXIUM) 40 MG capsule, 1 PO 30 MINUTES PRIOR TO MEALS BID (Patient not taking: Reported on 01/31/2020), Disp: 60 capsule, Rfl: 11 .  fluticasone (FLONASE) 50 MCG/ACT nasal spray, Place 1 spray into both nostrils 2 (two) times daily., Disp: , Rfl:  .  guaiFENesin-codeine (ROBITUSSIN AC) 100-10 MG/5ML syrup, Take 5 mLs by mouth 4 (four) times daily as needed for cough., Disp: , Rfl:  .  HYDROcodone-acetaminophen (NORCO) 10-325 MG tablet, Take 1 tablet by mouth every 6 (six) hours as needed for moderate pain or severe pain., Disp: , Rfl:  .  losartan (COZAAR) 25 MG tablet, Take 1 tablet (25 mg total) by mouth daily. (Patient not taking: Reported on 01/31/2020), Disp: 30 tablet, Rfl: 11 .  Olopatadine HCl 0.2 % SOLN, Apply 2 drops to eye 2 (two) times daily., Disp: , Rfl:  .  OMALIZUMAB [place], Inject 150 Units into the skin every 14 (fourteen) days., Disp: , Rfl:  .  phentermine 37.5 MG capsule, Take 37.5 mg by mouth every morning., Disp: , Rfl:  .  prednisoLONE acetate (PRED FORTE) 1 % ophthalmic suspension, Place 1 drop into both eyes 4 (four) times daily., Disp: , Rfl:  .  topiramate (TOPAMAX) 100 MG tablet, Take 100 mg by mouth 2 (two) times daily., Disp: , Rfl:  Medication Side Effects: none  Family Medical/ Social History: Changes? No  MENTAL HEALTH EXAM:  There were no vitals taken for this visit.There is no height or weight on file to calculate BMI.  General Appearance: Casual, Neat, Well Groomed and Obese  Eye Contact:  Good  Speech:  Clear and Coherent and Normal Rate  Volume:  Normal  Mood:  Depressed  Affect:  Tearful and is consolable. Tears up when talking about losing her Mom last year.  Thought  Process:  Goal Directed and Descriptions of Associations: Intact  Orientation:  Full (Time, Place, and Person)  Thought Content: Logical   Suicidal Thoughts:  No  Homicidal Thoughts:  No  Memory:  WNL  Judgement:  Good  Insight:  Good  Psychomotor Activity:  Normal  Concentration:  Concentration: Good  Recall:  Good  Fund of Knowledge: Good  Language: Good  Assets:  Desire for Improvement  ADL's:  Intact  Cognition: WNL  Prognosis:  Good    DIAGNOSES:    ICD-10-CM   1. Bipolar disorder, in partial remission, most recent episode depressed ([place])  F31.75   2. Generalized anxiety disorder  F41.1   3. Obstructive sleep apnea  G47.33   4. Grieving  F43.21  Receiving Psychotherapy: No    RECOMMENDATIONS:  PDMP reviewed. I spent 30 mins w/ her.  Continue Xanax 0.5 mg, 1 p.o. 3 times daily as needed. Continue amitriptyline 50 mg, 1 p.o. nightly. Continue Seroquel 400 mg, 2 p.o. nightly. Continue Zoloft 100 mg, 1.5 pills daily. Return in 3 months.  Donnal Moat, PA-C

## 2020-02-25 ENCOUNTER — Ambulatory Visit: Payer: Medicare HMO | Admitting: Nutrition

## 2020-03-11 ENCOUNTER — Encounter: Payer: Self-pay | Admitting: Gastroenterology

## 2020-03-11 DIAGNOSIS — E1165 Type 2 diabetes mellitus with hyperglycemia: Secondary | ICD-10-CM | POA: Insufficient documentation

## 2020-04-22 DIAGNOSIS — Z2820 Immunization not carried out because of patient decision for unspecified reason: Secondary | ICD-10-CM | POA: Insufficient documentation

## 2020-05-02 ENCOUNTER — Encounter: Payer: Self-pay | Admitting: Physician Assistant

## 2020-05-02 ENCOUNTER — Ambulatory Visit (INDEPENDENT_AMBULATORY_CARE_PROVIDER_SITE_OTHER): Payer: Medicare HMO | Admitting: Physician Assistant

## 2020-05-02 ENCOUNTER — Other Ambulatory Visit: Payer: Self-pay

## 2020-05-02 DIAGNOSIS — F411 Generalized anxiety disorder: Secondary | ICD-10-CM

## 2020-05-02 DIAGNOSIS — F4310 Post-traumatic stress disorder, unspecified: Secondary | ICD-10-CM

## 2020-05-02 DIAGNOSIS — F319 Bipolar disorder, unspecified: Secondary | ICD-10-CM | POA: Diagnosis not present

## 2020-05-02 DIAGNOSIS — G4733 Obstructive sleep apnea (adult) (pediatric): Secondary | ICD-10-CM | POA: Diagnosis not present

## 2020-05-02 DIAGNOSIS — F4321 Adjustment disorder with depressed mood: Secondary | ICD-10-CM

## 2020-05-02 MED ORDER — SERTRALINE HCL 100 MG PO TABS: 100.0000 mg | ORAL_TABLET | Freq: Every day | ORAL | 0 refills | Status: DC

## 2020-05-02 NOTE — Patient Instructions (Signed)
Increase the Zoloft (sertraline) 100 mg to 2 pills daily.
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
## 2020-05-04 MED ORDER — SERTRALINE HCL 100 MG PO TABS: 200.0000 mg | ORAL_TABLET | Freq: Every day | ORAL | 0 refills | Status: DC

## 2020-05-28 ENCOUNTER — Ambulatory Visit (INDEPENDENT_AMBULATORY_CARE_PROVIDER_SITE_OTHER): Payer: Medicare HMO | Admitting: Gastroenterology

## 2020-05-28 ENCOUNTER — Encounter: Payer: Self-pay | Admitting: Gastroenterology

## 2020-05-28 ENCOUNTER — Other Ambulatory Visit: Payer: Self-pay

## 2020-05-28 VITALS — BP 131/92 | HR 73 | Temp 97.1°F | Ht 62.0 in | Wt 217.8 lb

## 2020-05-28 DIAGNOSIS — K589 Irritable bowel syndrome without diarrhea: Secondary | ICD-10-CM | POA: Diagnosis not present

## 2020-05-28 DIAGNOSIS — K219 Gastro-esophageal reflux disease without esophagitis: Secondary | ICD-10-CM

## 2020-05-28 NOTE — Progress Notes (Signed)
Referring Provider: Emelda Fear, DO Primary Care Physician:  Emelda Fear, DO  Primary GI: Dr. Abbey Chatters, previously Dr. Oneida Alar   Chief Complaint  Patient presents with  . Abdominal Pain    below navel  . Constipation  . Gastroesophageal Reflux    occ    HPI:   Kathryn Olson is a 59 y.o. female presenting today for routine follow-up with a history of GERD, constipation. We had received referral for colonoscopy, but she is not due till 2023. There is mention of Crohn's disease in PMH, but I do not see anything on colonoscopy or CT to support this. Unclear why this is listed in chart.    No rectal bleeding. Mild constipation right now. Severe back pain. Linzess 290 mcg as needed. Sometimes will have diarrhea. GERD: taking omeprazole daily. No dysphagia. Will occasionally have GERD breakthrough. Will wake up with it at night occasionally. Cup of coffee in morning. Husband cooks. Boils hot dogs. Husband loves fried foods.   Sometimes has pain below navel, usually relieved after BM.   Past Medical History:  Diagnosis Date  . Anxiety   . Asthma   . CHF (congestive heart failure) ([place])   . Chronic abdominal pain   . Chronic headaches   . Complication of anesthesia   . Crohn's disease [facility]) June 2013  . Environmental allergies   . Family history of anesthesia complication    MH during C Section  . H/O colonoscopy   . History of electroencephalogram 03/2014   normal  . IBS (irritable bowel syndrome) June 2014  . 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 Migraine   . Obsessive-compulsive disorder   . PTSD (post-traumatic stress disorder)   . Seizures ([place])     Past Surgical History:  Procedure Laterality Date  . BLADDER SURGERY     age 8  . CESAREAN SECTION     X2  . COLONOSCOPY  05/22/2012   SLF: Polyps, multiple hyperplastic in the sigmoid colon/Polyp in the rectum/  MODERATE Diverticulosis throughout the colon/ Internal hemorrhoids  .  ESOPHAGOGASTRODUODENOSCOPY     remote, had ulcers, [place], Dr. Truddie Coco  . ESOPHAGOGASTRODUODENOSCOPY  05/22/2012   SLF: Stricture in the distal esophagus/Moderate gastritis  . PARTIAL HYSTERECTOMY    . TUBAL LIGATION      Current Outpatient Medications  Medication Sig Dispense Refill  . albuterol (PROVENTIL HFA;VENTOLIN HFA) 108 (90 Base) MCG/ACT inhaler Inhale 2 puffs into the lungs every 6 (six) hours as needed for wheezing or shortness of breath.    Marland Kitchen albuterol (PROVENTIL) (5 MG/ML) 0.5% nebulizer solution Take 2.5 mg by nebulization every 6 (six) hours as needed for wheezing or shortness of breath.    . ALPRAZolam (XANAX) 0.5 MG tablet Take 1 tablet (0.5 mg total) by mouth 3 (three) times daily as needed for anxiety. 90 tablet 2  . amitriptyline (ELAVIL) 50 MG tablet Take 1 tablet by mouth daily.    . carvedilol (COREG) 6.25 MG tablet Take 1 tablet (6.25 mg total) by mouth 2 (two) times daily. 60 tablet 11  . diclofenac sodium (VOLTAREN) 1 % GEL Apply 2 g topically 2 (two) times daily. Shoulder pain    . diphenhydrAMINE (BENADRYL) 25 MG tablet Take 25 mg by mouth every 6 (six) hours as needed for itching or allergies.    Marland Kitchen EPINEPHrine 0.3 mg/0.3 mL IJ SOAJ injection Inject 0.3 mg into the muscle as needed.    Marland Kitchen  esomeprazole (NEXIUM) 40 MG capsule 1 PO 30 MINUTES PRIOR TO MEALS BID (Patient taking differently: as needed. 1 PO 30 MINUTES PRIOR TO MEALS BID) 60 capsule 11  . furosemide (LASIX) 40 MG tablet Take 40 mg by mouth daily.     Marland Kitchen glyBURIDE-metformin (GLUCOVANCE) 2.5-500 MG tablet Take 2 tablets by mouth 2 (two) times daily.    Marland Kitchen guaiFENesin-codeine (ROBITUSSIN AC) 100-10 MG/5ML syrup Take 5 mLs by mouth 4 (four) times daily as needed for cough.    . hydrocortisone 2.5 % ointment APPLY 1 APPLICATION 4 TIMES A DAY 40 g 1  . hydrOXYzine (VISTARIL) 25 MG capsule Take 25 mg by mouth daily as needed for itching.    . levocetirizine (XYZAL) 5 MG tablet Take 5 mg by mouth every  evening.    . linaclotide (LINZESS) 290 MCG CAPS capsule Take 290 mcg by mouth daily.    Marland Kitchen losartan (COZAAR) 25 MG tablet Take 1 tablet (25 mg total) by mouth daily. 30 tablet 11  . omeprazole (PRILOSEC) 20 MG capsule Take 20 mg by mouth at bedtime.    Marland Kitchen QUEtiapine (SEROQUEL) 400 MG tablet Take 2 tablets (800 mg total) by mouth at bedtime. 180 tablet 1  . rosuvastatin (CRESTOR) 10 MG tablet rosuvastatin 10 mg tablet  Take 1 tablet every day by oral route at bedtime for 90 days.    Marland Kitchen sertraline (ZOLOFT) 100 MG tablet Take 2 tablets (200 mg total) by mouth daily. 180 tablet 0  . UNABLE TO FIND CPAP at bedtime    . Bepotastine Besilate 1.5 % SOLN Place 1 drop into both eyes 2 (two) times daily. (Patient not taking: Reported on 05/02/2020)    . empagliflozin (JARDIANCE) 25 MG TABS tablet Take 25 mg by mouth daily. (Patient not taking: Reported on 05/28/2020)    . famotidine (PEPCID) 20 MG tablet Take 20 mg by mouth as needed.  (Patient not taking: Reported on 05/28/2020)    . fluticasone (FLONASE) 50 MCG/ACT nasal spray Place 1 spray into both nostrils 2 (two) times daily. (Patient not taking: Reported on 05/28/2020)    . guaifenesin (ROBITUSSIN) 100 MG/5ML syrup Take 30 mLs by mouth 3 (three) times daily as needed for cough.    Marland Kitchen HYDROcodone-acetaminophen (NORCO) 10-325 MG tablet Take 1 tablet by mouth every 6 (six) hours as needed for moderate pain or severe pain. (Patient not taking: Reported on 05/28/2020)    . metFORMIN (GLUCOPHAGE) 500 MG tablet Take 500 mg by mouth as needed.  (Patient not taking: Reported on 05/28/2020)    . Olopatadine HCl 0.2 % SOLN Apply 2 drops to eye 2 (two) times daily. (Patient not taking: Reported on 05/02/2020)    . OMALIZUMAB [place] Inject 150 Units into the skin every 14 (fourteen) days. (Patient not taking: Reported on 05/28/2020)    . phentermine 37.5 MG capsule Take 37.5 mg by mouth every morning. (Patient not taking: Reported on 05/28/2020)    . prednisoLONE acetate (PRED  FORTE) 1 % ophthalmic suspension Place 1 drop into both eyes 4 (four) times daily. (Patient not taking: Reported on 05/02/2020)    . topiramate (TOPAMAX) 100 MG tablet Take 100 mg by mouth 2 (two) times daily. (Patient not taking: Reported on 05/28/2020)     No current facility-administered medications for this visit.    Allergies as of 05/28/2020 - Review Complete 05/28/2020  Allergen Reaction Noted  . Aspirin Anaphylaxis and Swelling 11/17/2011  . Peanut oil Anaphylaxis 10/25/2016  . Peanut-containing drug products  Anaphylaxis 11/17/2011  . Penicillins Anaphylaxis 11/17/2011  . Sulfa antibiotics Anaphylaxis 11/17/2011  . Sulfamethoxazole Anaphylaxis and Itching 10/25/2016  . Adhesive [tape] Rash 08/11/2014  . Codeine Itching and Rash 11/17/2011  . Latex Rash 01/12/2015  . Rubbing alcohol [alcohol] Rash 11/17/2011    Family History  Problem Relation Age of Onset  . Colon cancer Maternal Grandfather        before the age of 23  . Heart attack Father   . Alcohol abuse Father   . Diabetes Mother   . Hypertension Mother   . Dementia Mother   . Depression Mother   . Liver disease Brother        does not know details, but related to MVA?  Marland Kitchen Anxiety disorder Sister   . Bipolar disorder Sister   . OCD Sister   . Anxiety disorder Sister   . Depression Sister   . Malignant hyperthermia Daughter   . Heart murmur Son   . Inflammatory bowel disease Neg Hx   . ADD / ADHD Neg Hx   . Drug abuse Neg Hx   . Paranoid behavior Neg Hx   . Schizophrenia Neg Hx   . Seizures Neg Hx   . Sexual abuse Neg Hx   . Physical abuse Neg Hx   . Colon polyps Neg Hx     Social History   Socioeconomic History  . Marital status: Married    Spouse name: Not on file  . Number of children: 2  . Years of education: Not on file  . Highest education level: Not on file  Occupational History  . Occupation: plant, manual labor, lifting  . Occupation: Disabled   Tobacco Use  . Smoking status: Former  Smoker    Quit date: 01/02/2011    Years since quitting: 9.4  . Smokeless tobacco: Never Used  . Tobacco comment: Quit a few years ago  Vaping Use  . Vaping Use: Never used  Substance and Sexual Activity  . Alcohol use: No  . Drug use: No  . Sexual activity: Yes    Birth control/protection: Surgical  Other Topics Concern  . Not on file  Social History Narrative   Patient is married with 2 children.   Patient is disabled.   Patient has 12+ years or schooling    Patient states that she can write with both hands.          Social Determinants of Health   Financial Resource Strain:   . Difficulty of Paying Living Expenses:   Food Insecurity:   . Worried About Charity fundraiser in the Last Year:   . Arboriculturist in the Last Year:   Transportation Needs:   . Film/video editor (Medical):   Marland Kitchen Lack of Transportation (Non-Medical):   Physical Activity:   . Days of Exercise per Week:   . Minutes of Exercise per Session:   Stress:   . Feeling of Stress :   Social Connections:   . Frequency of Communication with Friends and Family:   . Frequency of Social Gatherings with Friends and Family:   . Attends Religious Services:   . Active Member of Clubs or Organizations:   . Attends Archivist Meetings:   Marland Kitchen Marital Status:     Review of Systems: Gen: Denies fever, chills, anorexia. Denies fatigue, weakness, weight loss.  CV: Denies chest pain, palpitations, syncope, peripheral edema, and claudication. Resp: Denies dyspnea at rest, cough, wheezing, coughing up blood,  and pleurisy. GI: see HPI Derm: Denies rash, itching, dry skin Psych: Denies depression, anxiety, memory loss, confusion. No homicidal or suicidal ideation.  Heme: Denies bruising, bleeding, and enlarged lymph nodes.  Physical Exam: BP (!) 131/92   Pulse 73   Temp (!) 97.1 F (36.2 C) (Temporal)   Ht 5' 2"  (1.575 m)   Wt 217 lb 12.8 oz (98.8 kg)   BMI 39.84 kg/m  General:   Alert and  oriented. No distress noted. Pleasant and cooperative.  Head:  Normocephalic and atraumatic. Eyes:  Conjuctiva clear without scleral icterus. Mouth:  Mask in place Abdomen:  +BS, soft, non-tender and non-distended. No rebound or guarding. No HSM or masses noted. Msk:  Symmetrical without gross deformities. Normal posture. Extremities:  Without edema. Neurologic:  Alert and  oriented x4 Psych:  Alert and cooperative. Normal mood and affect.  ASSESSMENT: Kathryn Olson is a 59 y.o. female presenting today with history of IBS-C and GERD for routine follow-up.  Linzess seems to be overshooting the mark, resulting in her taking only as needed and not ideally managing constipation. We will reduce this to 145 mcg daily, with samples provided. She is to call with update on how this works.   GERD: omeprazole daily without any alarm signs/symptoms.  Although there is mention of Crohn's disease in PMH, I do not see any endoscopic or CT evidence for this.    PLAN:  Linzess 145 mcg daily. Samples provided  Omeprazole daily  Colonoscopy in 2023 barring clinical changes  Return 6 months  Annitta Needs, PhD, [facility]

## 2020-05-28 NOTE — Patient Instructions (Signed)
Let's try Linzess at a lower dosage of 145 micrograms daily, 30 minutes before breakfast. If needed, we can decrease this as well.  Call if no improvement!  We will see you in 6 months or sooner if needed!  It was a pleasure to see you today. I want to create trusting relationships with patients to provide genuine, compassionate, and quality care. I value your feedback. If you receive a survey regarding your visit,  I greatly appreciate you taking time to fill this out.   Annitta Needs, PhD, ANP-BC [facility] Gastroenter[facility]

## 2020-05-29 ENCOUNTER — Encounter: Payer: Self-pay | Admitting: Gastroenterology

## 2020-05-30 ENCOUNTER — Ambulatory Visit: Payer: Medicare HMO | Admitting: Physician Assistant

## 2020-06-04 ENCOUNTER — Telehealth: Payer: Medicare HMO | Admitting: Physician Assistant

## 2020-06-05 ENCOUNTER — Encounter: Payer: Self-pay | Admitting: Gastroenterology

## 2020-08-26 ENCOUNTER — Encounter: Payer: Self-pay | Admitting: Cardiology

## 2020-08-26 ENCOUNTER — Other Ambulatory Visit: Payer: Self-pay

## 2020-08-26 ENCOUNTER — Ambulatory Visit (INDEPENDENT_AMBULATORY_CARE_PROVIDER_SITE_OTHER): Payer: Medicare HMO | Admitting: Cardiology

## 2020-08-26 VITALS — BP 138/88 | HR 70 | Ht 62.0 in | Wt 221.0 lb

## 2020-08-26 DIAGNOSIS — I10 Essential (primary) hypertension: Secondary | ICD-10-CM

## 2020-08-26 NOTE — Progress Notes (Signed)
Electrophysiology Office Note   Date:  08/26/2020   ID:  Kathryn Olson, DOB 07-07-1961, MRN [id number]  PCP:  Emelda Fear, DO  Primary Electrophysiologist:  Constance Haw, MD    No chief complaint on file.    History of Present Illness: Kathryn Olson is a 59 y.o. female who presents today for electrophysiology evaluation.   She was previously admitted to the hospital in [place] with an anaphylactic reaction.  At that time she had an echocardiogram and was told she was in congestive heart failure.  Prior to that she was having palpitations.  She wore a 30-day monitor without evidence of atrial arrhythmia.  She presents today for follow-up.  Today, denies symptoms of palpitations, chest pain, shortness of breath, orthopnea, PND, lower extremity edema, claudication, dizziness, presyncope, syncope, bleeding, or neurologic sequela. The patient is tolerating medications without difficulties.  Since last being seen she has done well.  She has no chest pain or shortness of breath.  Is able do all of her daily activities.  She has been a little bit less active as her mother recently passed away.  Aside from that, she has no cardiac symptoms.   Past Medical History:  Diagnosis Date   Anxiety    Asthma    CHF (congestive heart failure) ([place])    Chronic abdominal pain    Chronic headaches    Complication of anesthesia    Crohn's disease ([place]) 03/2012   ? as of 06/05/2020: do not see endoscopic evidence or CT evidence of this. ?erroneous?    Environmental allergies    Family history of anesthesia complication    MH during C Section   H/O colonoscopy    History of electroencephalogram 03/2014   normal   IBS (irritable bowel syndrome) June 2014   Malignant hyperthemia    [name] /   Pt has been tested positive   Migraine    Obsessive-compulsive disorder    PTSD (post-traumatic stress disorder)    Seizures ([place])    Past Surgical History:  Procedure Laterality  Date   BLADDER SURGERY     age 5   CESAREAN SECTION     X2   COLONOSCOPY  05/22/2012   SLF: Polyps, multiple hyperplastic in the sigmoid colon/Polyp in the rectum/  MODERATE Diverticulosis throughout the colon/ Internal hemorrhoids   ESOPHAGOGASTRODUODENOSCOPY     remote, had ulcers, [place], Dr. Truddie Coco   ESOPHAGOGASTRODUODENOSCOPY  05/22/2012   SLF: Stricture in the distal esophagus/Moderate gastritis   PARTIAL HYSTERECTOMY     TUBAL LIGATION       Current Outpatient Medications  Medication Sig Dispense Refill   albuterol (PROVENTIL HFA;VENTOLIN HFA) 108 (90 Base) MCG/ACT inhaler Inhale 2 puffs into the lungs every 6 (six) hours as needed for wheezing or shortness of breath.     albuterol (PROVENTIL) (5 MG/ML) 0.5% nebulizer solution Take 2.5 mg by nebulization every 6 (six) hours as needed for wheezing or shortness of breath.     ALPRAZolam (XANAX) 0.5 MG tablet Take 1 tablet (0.5 mg total) by mouth 3 (three) times daily as needed for anxiety. 90 tablet 2   amitriptyline (ELAVIL) 50 MG tablet Take 1 tablet by mouth daily.     carvedilol (COREG) 6.25 MG tablet Take 1 tablet (6.25 mg total) by mouth 2 (two) times daily. 60 tablet 11   diclofenac sodium (VOLTAREN) 1 % GEL Apply 2 g topically 2 (two) times daily. Shoulder pain     diphenhydrAMINE (BENADRYL) 25 MG tablet  Take 25 mg by mouth every 6 (six) hours as needed for itching or allergies.     EPINEPHrine 0.3 mg/0.3 mL IJ SOAJ injection Inject 0.3 mg into the muscle as needed.     esomeprazole (NEXIUM) 40 MG capsule 1 PO 30 MINUTES PRIOR TO MEALS BID (Patient taking differently: as needed. 1 PO 30 MINUTES PRIOR TO MEALS BID) 60 capsule 11   furosemide (LASIX) 40 MG tablet Take 40 mg by mouth daily.      glyBURIDE-metformin (GLUCOVANCE) 2.5-500 MG tablet Take 2 tablets by mouth 2 (two) times daily.     guaifenesin (ROBITUSSIN) 100 MG/5ML syrup Take 30 mLs by mouth 3 (three) times daily as needed for cough.       guaiFENesin-codeine (ROBITUSSIN AC) 100-10 MG/5ML syrup Take 5 mLs by mouth 4 (four) times daily as needed for cough.     hydrocortisone 2.5 % ointment APPLY 1 APPLICATION 4 TIMES A DAY 40 g 1   hydrOXYzine (VISTARIL) 25 MG capsule Take 25 mg by mouth daily as needed for itching.     levocetirizine (XYZAL) 5 MG tablet Take 5 mg by mouth every evening.     linaclotide (LINZESS) 290 MCG CAPS capsule Take 290 mcg by mouth daily.     losartan (COZAAR) 25 MG tablet Take 1 tablet (25 mg total) by mouth daily. 30 tablet 11   omeprazole (PRILOSEC) 20 MG capsule Take 20 mg by mouth at bedtime.     QUEtiapine (SEROQUEL) 400 MG tablet Take 2 tablets (800 mg total) by mouth at bedtime. 180 tablet 1   rosuvastatin (CRESTOR) 10 MG tablet rosuvastatin 10 mg tablet  Take 1 tablet every day by oral route at bedtime for 90 days.     sertraline (ZOLOFT) 100 MG tablet Take 2 tablets (200 mg total) by mouth daily. 180 tablet 0   UNABLE TO FIND CPAP at bedtime     No current facility-administered medications for this visit.    Allergies:   Aspirin, Peanut oil, Peanut-containing drug products, Penicillins, Sulfa antibiotics, Sulfamethoxazole, Adhesive [tape], Codeine, Latex, and Rubbing alcohol [alcohol]   Social History:  The patient  reports that she quit smoking about 9 years ago. She has never used smokeless tobacco. She reports that she does not drink alcohol and does not use drugs.   Family History:  The patient's family history includes Alcohol abuse in her father; Anxiety disorder in her sister and sister; Bipolar disorder in her sister; Colon cancer in her maternal grandfather; Dementia in her mother; Depression in her mother and sister; Diabetes in her mother; Heart attack in her father; Heart murmur in her son; Hypertension in her mother; Liver disease in her brother; Malignant hyperthermia in her daughter; OCD in her sister.    ROS:  Please see the history of present illness.   Otherwise,  review of systems is positive for none.   All other systems are reviewed and negative.   PHYSICAL EXAM: VS:  BP 138/88    Pulse 70    Ht 5' 2"  (1.575 m)    Wt 221 lb (100.2 kg)    SpO2 98%    BMI 40.42 kg/m  , BMI Body mass index is 40.42 kg/m. GEN: Well nourished, well developed, in no acute distress  HEENT: normal  Neck: no JVD, carotid bruits, or masses Cardiac: RRR; no murmurs, rubs, or gallops,no edema  Respiratory:  clear to auscultation bilaterally, normal work of breathing GI: soft, nontender, nondistended, + BS MS: no deformity or  atrophy  Skin: warm and dry Neuro:  Strength and sensation are intact Psych: euthymic mood, full affect  EKG:  EKG is ordered today. Personal review of the ekg ordered shows sinus rhythm, rate 70  Recent Labs: 12/31/2019: BUN 13; Creatinine, Ser 0.95; Potassium 5.2; Sodium 136    Lipid Panel     Component Value Date/Time   CHOL 203 (H) 09/04/2013 1728   TRIG 185 (H) 09/04/2013 1728   HDL 59 09/04/2013 1728   LDLCALC 107 (H) 09/04/2013 1728     Wt Readings from Last 3 Encounters:  08/26/20 221 lb (100.2 kg)  05/28/20 217 lb 12.8 oz (98.8 kg)  12/31/19 214 lb (97.1 kg)      Other studies Reviewed: Additional studies/ records that were reviewed today include: Myoview 01/05/17  Nuclear stress EF: 55%.  There was no ST segment deviation noted during stress.  The study is normal.  The left ventricular ejection fraction is normal (55-65%).   1. EF 55%, normal wall motion.  2. No significant perfusion defect, no evidence for ischemia or infarction.  3. Normal study.    ASSESSMENT AND PLAN:  1.  Hypertension: Currently on carvedilol and losartan.  Blood pressure is elevated today but she has yet to take her medications.  No changes.  2.  Chest pain: No further episodes  3.  Palpitations: Improved with beta-blockers.  4.  History of CHF: Ejection fraction fortunately is normal.  No changes.    Current medicines are reviewed  at length with the patient today.   The patient does not have concerns regarding her medicines.  The following changes were made today: None  Labs/ tests ordered today include:  Orders Placed This Encounter  Procedures   EKG 12-Lead     Disposition:   FU with Monik Lins 12 month  Signed, Deklyn Gibbon Meredith Leeds, MD  08/26/2020 11:12 AM     [facility] [address] [place] [address] [phone] (office) [phone] (fax)

## 2020-08-26 NOTE — Patient Instructions (Signed)
Medication Instructions:  °Your physician recommends that you continue on your current medications as directed. Please refer to the Current Medication list given to you today. ° °*If you need a refill on your cardiac medications before your next appointment, please call your pharmacy* ° ° °Lab Work: °None ordered ° ° °Testing/Procedures: °None ordered ° ° °Follow-Up: °At [facility], you and your health needs are our priority.  As part of our continuing mission to provide you with exceptional heart care, we have created designated Provider Care Teams.  These Care Teams include your primary Cardiologist (physician) and Advanced Practice Providers (APPs -  Physician Assistants and Nurse Practitioners) who all work together to provide you with the care you need, when you need it. ° °Your next appointment:   °1 year(s) ° °The format for your next appointment:   °In Person ° °Provider:   °Will Camnitz, MD ° ° ° °Thank you for choosing [facility]!! ° ° °Bali Lyn, RN °[phone] ° ° ° °

## 2020-08-27 ENCOUNTER — Encounter: Payer: Self-pay | Admitting: Physician Assistant

## 2020-08-27 ENCOUNTER — Ambulatory Visit (INDEPENDENT_AMBULATORY_CARE_PROVIDER_SITE_OTHER): Payer: Medicare HMO | Admitting: Physician Assistant

## 2020-08-27 DIAGNOSIS — F319 Bipolar disorder, unspecified: Secondary | ICD-10-CM

## 2020-08-27 DIAGNOSIS — F411 Generalized anxiety disorder: Secondary | ICD-10-CM

## 2020-08-27 DIAGNOSIS — F4321 Adjustment disorder with depressed mood: Secondary | ICD-10-CM | POA: Diagnosis not present

## 2020-08-27 DIAGNOSIS — G4733 Obstructive sleep apnea (adult) (pediatric): Secondary | ICD-10-CM

## 2020-08-27 DIAGNOSIS — F4310 Post-traumatic stress disorder, unspecified: Secondary | ICD-10-CM | POA: Diagnosis not present

## 2020-08-27 NOTE — Progress Notes (Signed)
[facility] Med Check  Patient ID: Kathryn Olson,  MRN: [id number]  PCP: Emelda Fear, DO  Date of Evaluation: 08/27/2020 Time spent:20 minutes  Chief Complaint:  Chief Complaint    Anxiety; Depression      HISTORY/CURRENT STATUS: HPI For routine med check.  Feels good for the most part.  Her son bought her a blue nosed pit bull puppy. "It's like raising a baby again."   She has been able to enjoy things most of the time. Has good days and bad days, but more good days than not. The new puppy helps keep her mind occupied. She still misses her Mom who has been gone over a year. Her energy and motivation are the same.  Not having a lot of anxiety.  She has the Xanax if needed.  Sleeps good most of the time.  Uses her CPAP every night.  Denies SI/HI.   Patient denies increased energy with decreased need for sleep, no increased talkativeness, no racing thoughts, no impulsivity or risky behaviors, no increased spending, no increased libido, no grandiosity, no increased irritability or anger, no paranoia, and no hallucinations.  Denies dizziness, syncope, seizures, numbness, tingling, tremor, tics, unsteady gait, slurred speech, confusion. Denies muscle or joint pain, stiffness, or dystonia. Denies unexplained weight loss, frequent infections, or sores that heal slowly.  No polyphagia, polydipsia, or polyuria. Denies visual changes or paresthesias.    Individual Medical History/ Review of Systems: Changes? :No    Past medications for mental health diagnoses include: Equetro, Seroquel, VPA, Xanax, Buspar  Allergies: Aspirin, Peanut oil, Peanut-containing drug products, Penicillins, Sulfa antibiotics, Sulfamethoxazole, Adhesive [tape], Codeine, Latex, and Rubbing alcohol [alcohol]  Current Medications:  Current Outpatient Medications:  .  albuterol (PROVENTIL HFA;VENTOLIN HFA) 108 (90 Base) MCG/ACT inhaler, Inhale 2 puffs into the lungs every 6 (six) hours as needed for wheezing or  shortness of breath., Disp: , Rfl:  .  albuterol (PROVENTIL) (5 MG/ML) 0.5% nebulizer solution, Take 2.5 mg by nebulization every 6 (six) hours as needed for wheezing or shortness of breath., Disp: , Rfl:  .  ALPRAZolam (XANAX) 0.5 MG tablet, Take 1 tablet (0.5 mg total) by mouth 3 (three) times daily as needed for anxiety., Disp: 90 tablet, Rfl: 2 .  amitriptyline (ELAVIL) 50 MG tablet, Take 1 tablet by mouth daily., Disp: , Rfl:  .  carvedilol (COREG) 6.25 MG tablet, Take 1 tablet (6.25 mg total) by mouth 2 (two) times daily., Disp: 60 tablet, Rfl: 11 .  diphenhydrAMINE (BENADRYL) 25 MG tablet, Take 25 mg by mouth every 6 (six) hours as needed for itching or allergies., Disp: , Rfl:  .  EPINEPHrine 0.3 mg/0.3 mL IJ SOAJ injection, Inject 0.3 mg into the muscle as needed., Disp: , Rfl:  .  furosemide (LASIX) 40 MG tablet, Take 40 mg by mouth daily. , Disp: , Rfl:  .  glyBURIDE-metformin (GLUCOVANCE) 2.5-500 MG tablet, Take 2 tablets by mouth 2 (two) times daily., Disp: , Rfl:  .  guaifenesin (ROBITUSSIN) 100 MG/5ML syrup, Take 30 mLs by mouth 3 (three) times daily as needed for cough., Disp: , Rfl:  .  guaiFENesin-codeine (ROBITUSSIN AC) 100-10 MG/5ML syrup, Take 5 mLs by mouth 4 (four) times daily as needed for cough., Disp: , Rfl:  .  hydrocortisone 2.5 % ointment, APPLY 1 APPLICATION 4 TIMES A DAY, Disp: 40 g, Rfl: 1 .  hydrOXYzine (VISTARIL) 25 MG capsule, Take 25 mg by mouth daily as needed for itching., Disp: , Rfl:  .  levocetirizine (  XYZAL) 5 MG tablet, Take 5 mg by mouth every evening., Disp: , Rfl:  .  losartan (COZAAR) 25 MG tablet, Take 1 tablet (25 mg total) by mouth daily., Disp: 30 tablet, Rfl: 11 .  omeprazole (PRILOSEC) 20 MG capsule, Take 20 mg by mouth at bedtime., Disp: , Rfl:  .  QUEtiapine (SEROQUEL) 400 MG tablet, Take 2 tablets (800 mg total) by mouth at bedtime., Disp: 180 tablet, Rfl: 1 .  rosuvastatin (CRESTOR) 10 MG tablet, rosuvastatin 10 mg tablet  Take 1 tablet every  day by oral route at bedtime for 90 days., Disp: , Rfl:  .  sertraline (ZOLOFT) 100 MG tablet, Take 2 tablets (200 mg total) by mouth daily., Disp: 180 tablet, Rfl: 0 .  UNABLE TO FIND, CPAP at bedtime, Disp: , Rfl:  .  diclofenac sodium (VOLTAREN) 1 % GEL, Apply 2 g topically 2 (two) times daily. Shoulder pain (Patient not taking: Reported on 08/27/2020), Disp: , Rfl:  .  esomeprazole (NEXIUM) 40 MG capsule, 1 PO 30 MINUTES PRIOR TO MEALS BID (Patient not taking: Reported on 08/27/2020), Disp: 60 capsule, Rfl: 11 .  linaclotide (LINZESS) 290 MCG CAPS capsule, Take 290 mcg by mouth daily., Disp: , Rfl:  Medication Side Effects: none  Family Medical/ Social History: Changes? See HPI  MENTAL HEALTH EXAM:  There were no vitals taken for this visit.There is no height or weight on file to calculate BMI.  General Appearance: Casual, Neat, Well Groomed and Obese  Eye Contact:  Good  Speech:  Clear and Coherent and Normal Rate  Volume:  Normal  Mood:  Anxious  Affect:  Anxious  Thought Process:  Goal Directed and Descriptions of Associations: Intact  Orientation:  Full (Time, Place, and Person)  Thought Content: Logical   Suicidal Thoughts:  No  Homicidal Thoughts:  No  Memory:  WNL  Judgement:  Good  Insight:  Good  Psychomotor Activity:  Normal  Concentration:  Concentration: Good and Attention Span: Good  Recall:  Good  Fund of Knowledge: Good  Language: Good  Assets:  Desire for Improvement  ADL's:  Intact  Cognition: WNL  Prognosis:  Good    DIAGNOSES:    ICD-10-CM   1. Bipolar I disorder ([place])  F31.9   2. PTSD (post-traumatic stress disorder)  F43.10   3. Generalized anxiety disorder  F41.1   4. Grieving  F43.21   5. Obstructive sleep apnea  G47.33     Receiving Psychotherapy: No    RECOMMENDATIONS:  PDMP reviewed. I provided 20 minutes of face to face time during this encounter. I'm glad she's doing well! Is being followed by PCP for know DM. Continue Xanax 0.5  mg, 1 p.o. 3 times daily as needed, rarely takes. Continue amitriptyline 50 mg, 1 p.o. nightly. Continue Seroquel 400 mg, 2 p.o. nightly. Continue Zoloft 100 mg, 2 po qd.  Strongly recommend counseling.  Return in 3 months.  Donnal Moat, PA-C

## 2020-11-07 DIAGNOSIS — U071 COVID-19: Secondary | ICD-10-CM | POA: Insufficient documentation

## 2020-11-07 DIAGNOSIS — J189 Pneumonia, unspecified organism: Secondary | ICD-10-CM | POA: Insufficient documentation

## 2020-11-26 ENCOUNTER — Ambulatory Visit: Payer: Medicare HMO | Admitting: Physician Assistant

## 2020-12-02 ENCOUNTER — Ambulatory Visit: Payer: Medicare HMO | Admitting: Gastroenterology

## 2020-12-31 ENCOUNTER — Other Ambulatory Visit: Payer: Self-pay | Admitting: Family Medicine

## 2020-12-31 DIAGNOSIS — Z1231 Encounter for screening mammogram for malignant neoplasm of breast: Secondary | ICD-10-CM

## 2021-01-01 ENCOUNTER — Other Ambulatory Visit: Payer: Self-pay | Admitting: Cardiology

## 2021-01-04 ENCOUNTER — Emergency Department (HOSPITAL_COMMUNITY)
Admission: EM | Admit: 2021-01-04 | Discharge: 2021-01-04 | Disposition: A | Discharge status: Home / Self Care | Payer: Medicare HMO | Attending: Emergency Medicine | Admitting: Emergency Medicine

## 2021-01-04 ENCOUNTER — Encounter (HOSPITAL_COMMUNITY): Payer: Self-pay

## 2021-01-04 ENCOUNTER — Other Ambulatory Visit: Payer: Self-pay

## 2021-01-04 ENCOUNTER — Emergency Department (HOSPITAL_COMMUNITY): Payer: Medicare HMO

## 2021-01-04 DIAGNOSIS — E119 Type 2 diabetes mellitus without complications: Secondary | ICD-10-CM | POA: Insufficient documentation

## 2021-01-04 DIAGNOSIS — R059 Cough, unspecified: Secondary | ICD-10-CM | POA: Insufficient documentation

## 2021-01-04 DIAGNOSIS — Z8616 Personal history of COVID-19: Secondary | ICD-10-CM | POA: Diagnosis not present

## 2021-01-04 DIAGNOSIS — J45909 Unspecified asthma, uncomplicated: Secondary | ICD-10-CM | POA: Diagnosis not present

## 2021-01-04 DIAGNOSIS — Z7984 Long term (current) use of oral hypoglycemic drugs: Secondary | ICD-10-CM | POA: Insufficient documentation

## 2021-01-04 DIAGNOSIS — Z87891 Personal history of nicotine dependence: Secondary | ICD-10-CM | POA: Diagnosis not present

## 2021-01-04 DIAGNOSIS — Z91010 Allergy to peanuts: Secondary | ICD-10-CM | POA: Insufficient documentation

## 2021-01-04 DIAGNOSIS — Z91040 Latex allergy status: Secondary | ICD-10-CM | POA: Insufficient documentation

## 2021-01-04 DIAGNOSIS — I5042 Chronic combined systolic (congestive) and diastolic (congestive) heart failure: Secondary | ICD-10-CM | POA: Diagnosis not present

## 2021-01-04 HISTORY — DX: Type 2 diabetes mellitus without complications: E11.9

## 2021-01-04 LAB — COMPREHENSIVE METABOLIC PANEL
ALT: 10 U/L (ref 0–44)
AST: 23 U/L (ref 15–41)
Albumin: 3.8 g/dL (ref 3.5–5.0)
Alkaline Phosphatase: 103 U/L (ref 38–126)
Anion gap: 10 (ref 5–15)
BUN: 9 mg/dL (ref 6–20)
CO2: 25 mmol/L (ref 22–32)
Calcium: 9.4 mg/dL (ref 8.9–10.3)
Chloride: 102 mmol/L (ref 98–111)
Creatinine, Ser: 0.84 mg/dL (ref 0.44–1.00)
GFR, Estimated: >60 mL/min (ref >60)
Glucose, Bld: 288 mg/dL — ABNORMAL HIGH (ref 70–99)
Potassium: 4.8 mmol/L (ref 3.5–5.1)
Sodium: 137 mmol/L (ref 135–145)
Total Bilirubin: 1.3 mg/dL — ABNORMAL HIGH (ref 0.3–1.2)
Total Protein: 7.6 g/dL (ref 6.5–8.1)

## 2021-01-04 LAB — CBC WITH DIFFERENTIAL/PLATELET
Abs Immature Granulocytes: 0.05 10*3/uL (ref 0.00–0.07)
Basophils Absolute: 0 10*3/uL (ref 0.0–0.1)
Basophils Relative: 0 %
Eosinophils Absolute: 0.3 10*3/uL (ref 0.0–0.5)
Eosinophils Relative: 3 %
HCT: 44.1 % (ref 36.0–46.0)
Hemoglobin: 13.9 g/dL (ref 12.0–15.0)
Immature Granulocytes: 0 %
Lymphocytes Relative: 20 %
Lymphs Abs: 2.3 10*3/uL (ref 0.7–4.0)
MCH: 28.5 pg (ref 26.0–34.0)
MCHC: 31.5 g/dL (ref 30.0–36.0)
MCV: 90.6 fL (ref 80.0–100.0)
Monocytes Absolute: 0.6 10*3/uL (ref 0.1–1.0)
Monocytes Relative: 5 %
Neutro Abs: 8.4 10*3/uL — ABNORMAL HIGH (ref 1.7–7.7)
Neutrophils Relative %: 72 %
Platelets: 212 10*3/uL (ref 150–400)
RBC: 4.87 MIL/uL (ref 3.87–5.11)
RDW: 13.5 % (ref 11.5–15.5)
WBC: 11.7 10*3/uL — ABNORMAL HIGH (ref 4.0–10.5)
nRBC: 0 % (ref 0.0–0.2)

## 2021-01-04 LAB — BRAIN NATRIURETIC PEPTIDE: B Natriuretic Peptide: 66.6 pg/mL (ref 0.0–100.0)

## 2021-01-04 LAB — CBG MONITORING, ED: Glucose-Capillary: 243 mg/dL — ABNORMAL HIGH (ref 70–99)

## 2021-01-04 MED ORDER — IOHEXOL 350 MG/ML SOLN
100.0000 mL | Freq: Once | INTRAVENOUS | Status: AC | PRN
  Administered 2021-01-04: 100 mL via INTRAVENOUS

## 2021-01-04 MED ORDER — HYDROCODONE-HOMATROPINE 5-1.5 MG/5ML PO SYRP: 5.0000 mL | ORAL_SOLUTION | Freq: Four times a day (QID) | ORAL | 0 refills | Status: DC | PRN

## 2021-01-04 NOTE — ED Provider Notes (Signed)
[facility] DEPT Provider Note   CSN: [id number] Arrival date & time: 01/04/21  1048     History Chief Complaint  Patient presents with  . Cough    Kathryn Olson is a 60 y.o. female.  60 year old female with past medical history below including asthma, migraines, PTSD, seizures, IBS, distant history of CHF who presents with cough.  Patient states that she was diagnosed with COVID-19 in January.  She had a cough with Covid but eventually the cough got better.  However, the cough later returned and she was eventually diagnosed with and treated for pneumonia.  She states that she had antibiotics and steroids.  However, her cough has persisted and has not improved despite these treatments.  She is not on any antibiotics or steroids currently.  The cough is worse at night and she reports associated shortness of breath.  Shortness of breath is especially worse when she lays flat.  She denies any weight gain or lower extremity edema.  She reports soreness on both sides of her chest wall with coughing. No fevers, vomiting, sick contacts, recent travel, OCP use, h/o blood clots, or h/o cancer. Her PCP wanted to get a chest CT but she has not gotten this test yet. No ACE inhibitor use.  The history is provided by the patient.  Cough      Past Medical History:  Diagnosis Date  . Anxiety   . Asthma   . CHF (congestive heart failure) ([place])   . Chronic abdominal pain   . Chronic headaches   . Complication of anesthesia   . Crohn's disease ([place]) 03/2012   ? as of 06/05/2020: do not see endoscopic evidence or CT evidence of this. ?erroneous?   . Environmental allergies   . Family history of anesthesia complication    MH during C Section  . H/O colonoscopy   . History of electroencephalogram 03/2014   normal  . IBS (irritable bowel syndrome) June 2014  . 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 Migraine   . Obsessive-compulsive  disorder   . PTSD (post-traumatic stress disorder)   . Seizures [facility])     Patient Active Problem List   Diagnosis Date Noted  . Encounter for hepatitis C screening test for low risk patient 02/22/2019  . GAD (generalized anxiety disorder) 09/11/2018  . Bipolar disorder ([place]) 09/11/2018  . Chronic tension-type headache, not intractable 10/26/2016  . Seizure disorder ([place]) 10/26/2016  . Crohn's disease of large intestine without complication ([place]) 16/10/930  . Environmental allergies 10/26/2016  . Chronic combined systolic and diastolic congestive heart failure ([place]) 10/25/2016  . Type 2 diabetes mellitus without complication, without long-term current use of insulin ([place]) 10/25/2016  . Pain in right arm 08/04/2016  . Anxiety 07/12/2016  . Gastroesophageal reflux disease without esophagitis 07/12/2016  . Chest wall muscle strain 08/06/2015  . IBS (irritable bowel syndrome) 09/04/2013  . Depression 09/04/2013  . Dysthymic disorder 01/29/2013  . PTSD (post-traumatic stress disorder) 01/29/2013  . Chronic back pain greater than 3 months duration 01/29/2013  . CNS disorder 01/29/2013  . Insomnia secondary to depression with anxiety 01/29/2013  . Insomnia secondary to chronic pain 01/29/2013    Past Surgical History:  Procedure Laterality Date  . BLADDER SURGERY     age 43  . CESAREAN SECTION     X2  . COLONOSCOPY  05/22/2012   SLF: Polyps, multiple hyperplastic in the sigmoid colon/Polyp in  the rectum/  MODERATE Diverticulosis throughout the colon/ Internal hemorrhoids  . ESOPHAGOGASTRODUODENOSCOPY     remote, had ulcers, [place], Dr. Truddie Coco  . ESOPHAGOGASTRODUODENOSCOPY  05/22/2012   SLF: Stricture in the distal esophagus/Moderate gastritis  . PARTIAL HYSTERECTOMY    . TUBAL LIGATION       OB History   No obstetric history on file.     Family History  Problem Relation Age of Onset  . Colon cancer Maternal Grandfather        before the age of 46  . Heart attack  Father   . Alcohol abuse Father   . Diabetes Mother   . Hypertension Mother   . Dementia Mother   . Depression Mother   . Liver disease Brother        does not know details, but related to MVA?  Marland Kitchen Anxiety disorder Sister   . Bipolar disorder Sister   . OCD Sister   . Anxiety disorder Sister   . Depression Sister   . Malignant hyperthermia Daughter   . Heart murmur Son   . Inflammatory bowel disease Neg Hx   . ADD / ADHD Neg Hx   . Drug abuse Neg Hx   . Paranoid behavior Neg Hx   . Schizophrenia Neg Hx   . Seizures Neg Hx   . Sexual abuse Neg Hx   . Physical abuse Neg Hx   . Colon polyps Neg Hx     Social History   Tobacco Use  . Smoking status: Former Smoker    Quit date: 01/02/2011    Years since quitting: 10.0  . Smokeless tobacco: Never Used  . Tobacco comment: Quit a few years ago  Vaping Use  . Vaping Use: Never used  Substance Use Topics  . Alcohol use: No  . Drug use: No    Home Medications Prior to Admission medications   Medication Sig Start Date End Date Taking? Authorizing Provider  albuterol (PROVENTIL HFA;VENTOLIN HFA) 108 (90 Base) MCG/ACT inhaler Inhale 2 puffs into the lungs every 6 (six) hours as needed for wheezing or shortness of breath.    [provider]  albuterol (PROVENTIL) (5 MG/ML) 0.5% nebulizer solution Take 2.5 mg by nebulization every 6 (six) hours as needed for wheezing or shortness of breath.    [provider]  ALPRAZolam Duanne Moron) 0.5 MG tablet Take 1 tablet (0.5 mg total) by mouth 3 (three) times daily as needed for anxiety. 12/20/19   Donnal Moat T, PA-C  amitriptyline (ELAVIL) 50 MG tablet Take 1 tablet by mouth daily. 09/12/16   [provider]  carvedilol (COREG) 6.25 MG tablet TAKE 1 TABLET BY MOUTH TWICE A DAY 01/01/21   Camnitz, Ocie Doyne, MD  diclofenac sodium (VOLTAREN) 1 % GEL Apply 2 g topically 2 (two) times daily. Shoulder pain Patient not taking: Reported on 08/27/2020    [provider]  diphenhydrAMINE (BENADRYL) 25 MG tablet Take 25 mg by mouth every 6 (six) hours as needed for itching or allergies.    [provider]  EPINEPHrine 0.3 mg/0.3 mL IJ SOAJ injection Inject 0.3 mg into the muscle as needed.    [provider]  esomeprazole (NEXIUM) 40 MG capsule 1 PO 30 MINUTES PRIOR TO MEALS BID Patient not taking: Reported on 08/27/2020 07/13/18   Danie Binder, MD  furosemide (LASIX) 40 MG tablet Take 40 mg by mouth daily.  07/12/16   [provider]  glyBURIDE-metformin (GLUCOVANCE) 2.5-500 MG tablet Take 2  tablets by mouth 2 (two) times daily. 03/14/20   [provider]  guaifenesin (ROBITUSSIN) 100 MG/5ML syrup Take 30 mLs by mouth 3 (three) times daily as needed for cough.    [provider]  guaiFENesin-codeine (ROBITUSSIN AC) 100-10 MG/5ML syrup Take 5 mLs by mouth 4 (four) times daily as needed for cough.    [provider]  hydrocortisone 2.5 % ointment APPLY 1 APPLICATION 4 TIMES A DAY 07/19/17   Carlis Stable, NP  hydrOXYzine (VISTARIL) 25 MG capsule Take 25 mg by mouth daily as needed for itching.    [provider]  levocetirizine (XYZAL) 5 MG tablet Take 5 mg by mouth every evening.    [provider]  linaclotide (LINZESS) 290 MCG CAPS capsule Take 290 mcg by mouth daily. 10/25/16   [provider]  losartan (COZAAR) 25 MG tablet Take 1 tablet (25 mg total) by mouth daily. 01/01/20   Camnitz, Ocie Doyne, MD  omeprazole (PRILOSEC) 20 MG capsule Take 20 mg by mouth at bedtime.    [provider]  QUEtiapine (SEROQUEL) 400 MG tablet Take 2 tablets (800 mg total) by mouth at bedtime. 12/20/19   Donnal Moat T, PA-C  rosuvastatin (CRESTOR) 10 MG tablet rosuvastatin 10 mg tablet  Take 1 tablet every day by oral route at bedtime for 90 days.    [provider]  sertraline (ZOLOFT) 100 MG tablet Take 2 tablets (200 mg total) by mouth daily. 05/04/20   Hurst, Dorothea Glassman, PA-C  UNABLE  TO FIND CPAP at bedtime    [provider]    Allergies    Aspirin, Peanut oil, Peanut-containing drug products, Penicillins, Sulfa antibiotics, Sulfamethoxazole, Adhesive [tape], Codeine, Latex, and Rubbing alcohol [alcohol]  Review of Systems   Review of Systems  Respiratory: Positive for cough.    All other systems reviewed and are negative except that which was mentioned in HPI  Physical Exam Updated Vital Signs BP 125/89   Pulse 82   Temp 98.2 F (36.8 C) (Oral)   Resp 18   Ht 5' 2"  (1.575 m)   SpO2 92%   BMI 40.42 kg/m   Physical Exam Vitals and nursing note reviewed.  Constitutional:      General: She is not in acute distress.    Appearance: Normal appearance.  HENT:     Head: Normocephalic and atraumatic.  Eyes:     Conjunctiva/sclera: Conjunctivae normal.  Cardiovascular:     Rate and Rhythm: Normal rate and regular rhythm.     Heart sounds: Normal heart sounds. No murmur heard.   Pulmonary:     Effort: Pulmonary effort is normal.     Breath sounds: Normal breath sounds.     Comments: Occasional dry cough Abdominal:     General: Abdomen is flat. Bowel sounds are normal. There is no distension.     Palpations: Abdomen is soft.     Tenderness: There is no abdominal tenderness.  Musculoskeletal:     Right lower leg: No edema.     Left lower leg: No edema.  Skin:    General: Skin is warm and dry.  Neurological:     Mental Status: She is alert and oriented to person, place, and time.     Comments: fluent  Psychiatric:        Mood and Affect: Mood normal.        Behavior: Behavior normal.     ED Results / Procedures / Treatments   Labs (all  labs ordered are listed, but only abnormal results are displayed) Labs Reviewed  CBG MONITORING, ED - Abnormal; Notable for the following components:      Result Value   Glucose-Capillary 243 (*)    All other components within normal limits  CBC WITH DIFFERENTIAL/PLATELET  BRAIN NATRIURETIC PEPTIDE   COMPREHENSIVE METABOLIC PANEL    EKG None  Radiology No results found.  Procedures Procedures   Medications Ordered in ED Medications - No data to display  ED Course  I have reviewed the triage vital signs and the nursing notes.  Pertinent labs & imaging results that were available during my care of the patient were reviewed by me and considered in my medical decision making (see chart for details).    MDM Rules/Calculators/A&P                          Patient alert, hypertensive but normal O2 sat on room air and normal work of breathing.  No obvious wheezing or crackles.  It sounds like her PCP had planned to get a CT but she has not been set up for it yet.  Will obtain lab work to evaluate kidney function and screening BNP to rule out heart failure exacerbation with plan to get CT chest.  I am signing out to oncoming provider pending lab and imaging results. Final Clinical Impression(s) / ED Diagnoses Final diagnoses:  None    Rx / DC Orders ED Discharge Orders    None       Morty Ortwein, Wenda Overland, MD 01/04/21 1507

## 2021-01-04 NOTE — Discharge Instructions (Signed)
Follow-up with [facility] for persistent cough

## 2021-01-04 NOTE — ED Triage Notes (Signed)
Patient reports testing covid positive in January, then developed pneumonia. Patient states she was treated for pneumonia. But still has cough. Patient states she was given cough syrup for the cough, but is not working. Patient says she is awaiting on CT scan from PCP.

## 2021-01-04 NOTE — ED Provider Notes (Signed)
CT angio chest was negative for PE.  She is referred to pulmonary for persistent cough and given some Ned Grace, MD 01/04/21 1625

## 2021-01-08 ENCOUNTER — Ambulatory Visit (INDEPENDENT_AMBULATORY_CARE_PROVIDER_SITE_OTHER): Payer: Medicare HMO | Admitting: Pulmonary Disease

## 2021-01-08 ENCOUNTER — Encounter: Payer: Self-pay | Admitting: Pulmonary Disease

## 2021-01-08 ENCOUNTER — Other Ambulatory Visit: Payer: Self-pay

## 2021-01-08 VITALS — BP 134/80 | HR 95 | Temp 97.1°F | Ht 62.0 in | Wt 216.2 lb

## 2021-01-08 DIAGNOSIS — J4540 Moderate persistent asthma, uncomplicated: Secondary | ICD-10-CM | POA: Diagnosis not present

## 2021-01-08 DIAGNOSIS — R053 Chronic cough: Secondary | ICD-10-CM | POA: Diagnosis not present

## 2021-01-08 MED ORDER — TRELEGY ELLIPTA 200-62.5-25 MCG/INH IN AEPB: 1.0000 | INHALATION_SPRAY | Freq: Every day | RESPIRATORY_TRACT | 0 refills | Status: DC

## 2021-01-08 MED ORDER — TRELEGY ELLIPTA 200-62.5-25 MCG/INH IN AEPB: 1.0000 | INHALATION_SPRAY | Freq: Every day | RESPIRATORY_TRACT | 3 refills | Status: DC

## 2021-01-08 MED ORDER — HYDROCODONE-HOMATROPINE 5-1.5 MG/5ML PO SYRP: 5.0000 mL | ORAL_SOLUTION | Freq: Every day | ORAL | 0 refills | Status: DC | PRN

## 2021-01-08 NOTE — Patient Instructions (Addendum)
I am most concerned your cough could be related to asthma or inflammation in the lungs as a result of your recent Covid infection.  Use Trelegy 1 puff daily.  Please rinse out your mouth after every use.  This is designed to decrease inflammation in the lungs as well as open up the airways to help improve your cough.  I worry a bit about reflux as another cause of cough.  I know your heartburn symptoms are better controlled on medicines, but a large portion of patients with reflux will have silent symptoms that cause cough.  With your cough sounds like during our discussion this concerning for possible reflux.  We can address this in the future if the inhaler is not helping.   I provided a small amount of cough syrup that she requested.  I do not plan on providing additional refills.  My goal is to get to the cause of the cough and help improve or reverse that as opposed to just treating the symptom.  Return to clinic in 2 months with Dr. Silas Flood

## 2021-01-08 NOTE — Progress Notes (Signed)
@Patient  ID: Kathryn Olson, female    DOB: [birth year], 60 y.o.   MRN: [id number]  Chief Complaint  Patient presents with  . Consult    ED referral for cough and wheezing. Tested positive for covid back in January 2022 after being exposed during the holidays. Has had the cough and wheezing ever since.     Referring provider: Milton Ferguson, MD  HPI:   60 year old whom we are seeing for evaluation of, dyspnea following COVID-19 infection 10/2020.  PCP note reviewed recent ED note reviewed.  Patient was in usual state of health.  Had family get together over [place].  Visitor at that get together had been exposed to Covid.  Patient's also contracted COVID 10/2020.  Since then, has had persistent cough.  Dyspnea exertion present as well.  Most bothersome thing is cough.  Worse in the evenings and early mornings.  Nonproductive the day but productive in the evenings and overnight.  Described as severe.  Uses albuterol and that calms things down a bit.  No environmental factors that make cough better or worse.  No other alleviating or exacerbating factors.  She endorses history of heartburn.  The switch PPIs recently been on over-the-counter PPI.  Heartburn symptoms better.  Notes a lot of congestion in the back of her throat.  Occasionally feels like it is closing off.  Has been sore the last few mornings.  She endorses seasonal allergies.  Not bad recently as she is only stayed inside, rarely goes outdoors.  But has rhinorrhea, watery eyes with changes in symptoms, increased pollen around.  CT chest PE protocol 01/04/2021 reviewed and interpreted as no PE, clear lungs, mild mosaicism on what appears to be expiratory image.  PMH: Hypertension, asthma, diabetes, allergies, headaches, sleep apnea Surgical history: Hysterectomy, tubal ligation Family history: Allergies, asthma in first-degree relatives, Social history: Former smoker, quit 2012, lives in [place] / Pulmonary Flowsheets:   ACT:  No flowsheet data found.  MMRC: No flowsheet data found.  Epworth:  No flowsheet data found.  Tests:   FENO:  No results found for: NITRICOXIDE  PFT: No flowsheet data found.  WALK:  No flowsheet data found.  Imaging: Personally reviewed and as per EMR and discussion of this note CT Angio Chest PE W/Cm &/Or Wo Cm  Result Date: 01/04/2021 CLINICAL DATA:  Persistent cough since January when diagnosed with Covid EXAM: CT ANGIOGRAPHY CHEST WITH CONTRAST TECHNIQUE: Multidetector CT imaging of the chest was performed using the standard protocol during bolus administration of intravenous contrast. Multiplanar CT image reconstructions and MIPs were obtained to evaluate the vascular anatomy. CONTRAST:  162m OMNIPAQUE IOHEXOL 350 MG/ML SOLN COMPARISON:  CT chest 11/17/2016 FINDINGS: Cardiovascular: Satisfactory opacification of the pulmonary arteries to the segmental level. No evidence of pulmonary embolism. Normal heart size. No pericardial effusion. The normal caliber of the thoracic aorta. Minimal calcification of the aortic arch. Mediastinum/Nodes: No enlarged mediastinal, hilar, or axillary lymph nodes. Thyroid gland, trachea, and esophagus demonstrate no significant findings. Lungs/Pleura: Central airways are patent. No pneumothorax or pleural effusion. There are small opacities in the anterior left upper lung (series 5, image 53), possibly representing residual infection. There is a 5 mm ground-glass perifissural nodule in the right lung apex (series 7, image 30) Upper Abdomen: No acute abnormality. Musculoskeletal: No chest wall abnormality. No acute or significant osseous findings. Review of the MIP images confirms the above findings. IMPRESSION: 1. No evidence of pulmonary embolism. 2. Small opacities in  the anterior left upper lung, possibly representing residual infection. 3. 5 mm ground-glass perifissural nodule in the right lung  apex. No follow-up recommended. This recommendation follows the consensus statement: Guidelines for Management of Incidental Pulmonary Nodules Detected on CT Images: From the Fleischner Society 2017; Radiology 2017; 284:228-243. Electronically Signed   By: Audie Pinto M.D.   On: 01/04/2021 16:11    Lab Results: Personally reviewed, eosinophils as high as 300 CBC    Component Value Date/Time   WBC 11.7 (H) 01/04/2021 1420   RBC 4.87 01/04/2021 1420   HGB 13.9 01/04/2021 1420   HCT 44.1 01/04/2021 1420   HCT 43 05/29/2012 0916   PLT 212 01/04/2021 1420   MCV 90.6 01/04/2021 1420   MCV 87.2 09/04/2013 1726   MCV 88.0 05/29/2012 0916   MCH 28.5 01/04/2021 1420   MCHC 31.5 01/04/2021 1420   RDW 13.5 01/04/2021 1420   LYMPHSABS 2.3 01/04/2021 1420   MONOABS 0.6 01/04/2021 1420   EOSABS 0.3 01/04/2021 1420   BASOSABS 0.0 01/04/2021 1420    BMET    Component Value Date/Time   NA 137 01/04/2021 1420   NA 136 12/31/2019 1130   K 4.8 01/04/2021 1420   K 4.2 05/29/2012 0916   CL 102 01/04/2021 1420   CL 103 05/29/2012 0916   CO2 25 01/04/2021 1420   CO2 27 05/29/2012 0916   GLUCOSE 288 (H) 01/04/2021 1420   BUN 9 01/04/2021 1420   BUN 13 12/31/2019 1130   CREATININE 0.84 01/04/2021 1420   CREATININE 0.70 05/29/2012 0916   CALCIUM 9.4 01/04/2021 1420   CALCIUM 9.8 05/29/2012 0916   GFRNONAA >60 01/04/2021 1420   GFRAA 76 12/31/2019 1130    BNP    Component Value Date/Time   BNP 66.6 01/04/2021 1420    ProBNP    Component Value Date/Time   PROBNP 24 06/26/2018 1243   PROBNP 5.0 11/17/2011 2305    Specialty Problems   None     Allergies  Allergen Reactions  . Aspirin Anaphylaxis and Swelling  . Peanut Oil Anaphylaxis  . Peanut-Containing Drug Products Anaphylaxis       . Penicillins Anaphylaxis    Stops her heart. Has patient had a PCN reaction causing immediate rash, facial/tongue/throat swelling, SOB or lightheadedness with hypotension: yes Has  patient had a PCN reaction causing severe rash involving mucus membranes or skin necrosis: no Has patient had a PCN reaction that required hospitalization: yes Has patient had a PCN reaction occurring within the last 10 years: yes If all of the above answers are "NO", then may proceed with Cephalosporin use.  Other reaction(s): Other (See Comments)  . Sulfa Antibiotics Anaphylaxis  . Sulfamethoxazole Anaphylaxis and Itching  . Adhesive [Tape] Rash  . Codeine Itching and Rash  . Latex Rash  . Rubbing Alcohol [Alcohol] Rash    Immunization History  Administered Date(s) Administered  . Influenza Inj Mdck Quad Pf 09/13/2016  . Pneumococcal Polysaccharide-23 04/12/2017    Past Medical History:  Diagnosis Date  . Anxiety   . Asthma   . CHF (congestive heart failure) ([place])   . Chronic abdominal pain   . Chronic headaches   . Complication of anesthesia   . Crohn's disease ([place]) 03/2012   ? as of 06/05/2020: do not see endoscopic evidence or CT evidence of this. ?erroneous?   . Diabetes mellitus without complication ([place])   . Environmental allergies   . Family history of anesthesia complication    MH during  C Section  . H/O colonoscopy   . History of electroencephalogram 03/2014   normal  . IBS (irritable bowel syndrome) June 2014  . 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 Migraine   . Obsessive-compulsive disorder   . PTSD (post-traumatic stress disorder)   . Seizures ([place])     Tobacco History: Social History   Tobacco Use  Smoking Status Former Smoker  . Quit date: 01/02/2011  . Years since quitting: 10.0  Smokeless Tobacco Never Used  Tobacco Comment   Quit a few years ago   Counseling given: Not Answered Comment: Quit a few years ago   Continue to not smoke  Outpatient Encounter Medications as of 01/08/2021  Medication Sig  . albuterol (PROVENTIL HFA;VENTOLIN HFA) 108 (90 Base) MCG/ACT inhaler Inhale 2 puffs into the lungs  every 6 (six) hours as needed for wheezing or shortness of breath.  Marland Kitchen albuterol (PROVENTIL) (5 MG/ML) 0.5% nebulizer solution Take 2.5 mg by nebulization every 6 (six) hours as needed for wheezing or shortness of breath.  . ALPRAZolam (XANAX) 0.5 MG tablet Take 1 tablet (0.5 mg total) by mouth 3 (three) times daily as needed for anxiety.  Marland Kitchen amitriptyline (ELAVIL) 50 MG tablet Take 1 tablet by mouth daily.  . carvedilol (COREG) 6.25 MG tablet TAKE 1 TABLET BY MOUTH TWICE A DAY  . diphenhydrAMINE (BENADRYL) 25 MG tablet Take 25 mg by mouth every 6 (six) hours as needed for itching or allergies.  Marland Kitchen EPINEPHrine 0.3 mg/0.3 mL IJ SOAJ injection Inject 0.3 mg into the muscle as needed.  . Fluticasone-Umeclidin-Vilant (TRELEGY ELLIPTA) 200-62.5-25 MCG/INH AEPB Inhale 1 puff into the lungs daily.  . furosemide (LASIX) 40 MG tablet Take 40 mg by mouth daily.   Marland Kitchen glyBURIDE-metformin (GLUCOVANCE) 2.5-500 MG tablet Take 2 tablets by mouth 2 (two) times daily.  Marland Kitchen guaifenesin (ROBITUSSIN) 100 MG/5ML syrup Take 30 mLs by mouth 3 (three) times daily as needed for cough.  Marland Kitchen guaiFENesin-codeine (ROBITUSSIN AC) 100-10 MG/5ML syrup Take 5 mLs by mouth 4 (four) times daily as needed for cough.  Marland Kitchen HYDROcodone-homatropine (HYDROMET) 5-1.5 MG/5ML syrup Take 5 mLs by mouth daily as needed for cough. At night.  . hydrocortisone 2.5 % ointment APPLY 1 APPLICATION 4 TIMES A DAY  . hydrOXYzine (VISTARIL) 25 MG capsule Take 25 mg by mouth daily as needed for itching.  . levocetirizine (XYZAL) 5 MG tablet Take 5 mg by mouth every evening.  . linaclotide (LINZESS) 290 MCG CAPS capsule Take 290 mcg by mouth daily.  Marland Kitchen losartan (COZAAR) 25 MG tablet Take 1 tablet (25 mg total) by mouth daily.  Marland Kitchen omeprazole (PRILOSEC) 20 MG capsule Take 20 mg by mouth at bedtime.  Marland Kitchen QUEtiapine (SEROQUEL) 400 MG tablet Take 2 tablets (800 mg total) by mouth at bedtime.  . rosuvastatin (CRESTOR) 10 MG tablet rosuvastatin 10 mg tablet  Take 1 tablet  every day by oral route at bedtime for 90 days.  Marland Kitchen sertraline (ZOLOFT) 100 MG tablet Take 2 tablets (200 mg total) by mouth daily.  Marland Kitchen UNABLE TO FIND CPAP at bedtime  . [DISCONTINUED] diclofenac sodium (VOLTAREN) 1 % GEL Apply 2 g topically 2 (two) times daily. Shoulder pain (Patient not taking: Reported on 08/27/2020)  . [DISCONTINUED] esomeprazole (NEXIUM) 40 MG capsule 1 PO 30 MINUTES PRIOR TO MEALS BID (Patient not taking: Reported on 08/27/2020)  . [DISCONTINUED] HYDROcodone-homatropine (HYCODAN) 5-1.5 MG/5ML syrup Take 5 mLs by mouth every 6 (  six) hours as needed for cough.   No facility-administered encounter medications on file as of 01/08/2021.     Review of Systems  Review of Systems  No chest pain with exertion.  No orthopnea or PND.  No lower semiswelling.  Comprehensive review of systems otherwise negative. Physical Exam  BP 134/80   Pulse 95   Temp (!) 97.1 F (36.2 C) (Temporal)   Ht 5' 2"  (1.575 m)   Wt 216 lb 3.2 oz (98.1 kg)   SpO2 97% Comment: on RA  BMI 39.54 kg/m   Wt Readings from Last 5 Encounters:  01/08/21 216 lb 3.2 oz (98.1 kg)  08/26/20 221 lb (100.2 kg)  05/28/20 217 lb 12.8 oz (98.8 kg)  12/31/19 214 lb (97.1 kg)  08/15/19 200 lb (90.7 kg)    BMI Readings from Last 5 Encounters:  01/08/21 39.54 kg/m  01/04/21 40.42 kg/m  08/26/20 40.42 kg/m  05/28/20 39.84 kg/m  12/31/19 39.14 kg/m     Physical Exam General: Well-appearing, no acute distress, barking cough during evaluation Eyes: EOMI, no icterus Neck: Supple no JVP Cardiovascular: Regular rate and rhythm, no murmurs Pulmonary: Normal work of breathing, clear to auscultation bilaterally Abdomen: Nondistended, bowel sounds present MSK: No synovitis, joint effusion Neuro: Normal gait, no weakness Psych: Normal mood, full affect   Assessment & Plan:   Cough: She has atopic symptoms, history of asthma, recent viral infection.  No suspicious for viral induced or post viral cough  and reactivation of asthma symptoms.  Mild improvement with albuterol.  Continue this as needed.   Additional contributors to cough to consider are GERD given reflux symptoms admittedly better on PPI.  Worried about silent reflux especially given the nature of her cough today.   Asthma: Based on atopic symptoms, history of the same, improved symptoms with albuterol.  Add Trelegy 1 puff daily to help symptoms.  Return in about 2 months (around 03/10/2021).   Lanier Clam, MD 01/08/2021

## 2021-01-08 NOTE — Addendum Note (Signed)
Addended by: Valerie Salts on: 01/08/2021 02:40 PM   Modules accepted: Orders

## 2021-01-09 ENCOUNTER — Telehealth: Payer: Self-pay | Admitting: Pulmonary Disease

## 2021-01-09 NOTE — Telephone Encounter (Signed)
Called the pharmacy to see if they received prescription. Spoke with the pharmacist Devina, she states they have the medication in brand name and it is ready for her to pick up. Called and left message on patient's VM to give our office a call back.

## 2021-01-09 NOTE — Telephone Encounter (Signed)
01/09/2021   PMP aware reviewed.  Patient has already received a prescription for Hycodan cough syrup on 01/04/2021.  This was a 6-day supply.  I would have her contact the pharmacy to see if the prescription can be routed to [facility]  As I did not see the patient I would not be prescribing narcotic cough syrup telephonically especially given the fact that she was recently prescribed a narcotic cough syrup 5 days ago which she still should have supply of. This is not something that we will manage in triage.   In the meantime patient can use Delsym over-the-counter cough medicine.  Wyn Quaker, FNP

## 2021-01-09 NOTE — Telephone Encounter (Signed)
Called and spoke with patient. She stated that the [facility] in [place] has the RX but they do not have the Hydromet in stock and will not have it in stock until late next week. She called [facility] in [place] and they do have the Hydromet in stock. She is requesting to have it sent there since she is completely out.   [facility] is unavailable until 01/20/21. Aaron Edelman, would you be willing to send this in for her?

## 2021-01-09 NOTE — Telephone Encounter (Signed)
Called and spoke with patient. [facility] will not [facility] the [facility] since it contains a narcotic, [facility] will need a new RX. Patient was very upset about this. Did try to encourage her to use the OTC cough syrup or contact her PCP to see if they would refill it for her. She verbalized understanding.   Nothing further needed at time of call.

## 2021-01-09 NOTE — Telephone Encounter (Signed)
Patient is returning phone call. Patient phone number is [phone].

## 2021-01-19 ENCOUNTER — Emergency Department (HOSPITAL_COMMUNITY): Payer: Medicare HMO

## 2021-01-19 ENCOUNTER — Emergency Department (HOSPITAL_COMMUNITY)
Admission: EM | Admit: 2021-01-19 | Discharge: 2021-01-20 | Disposition: A | Discharge status: Home / Self Care | Payer: Medicare HMO | Attending: Emergency Medicine | Admitting: Emergency Medicine

## 2021-01-19 ENCOUNTER — Other Ambulatory Visit: Payer: Self-pay

## 2021-01-19 ENCOUNTER — Encounter (HOSPITAL_COMMUNITY): Payer: Self-pay | Admitting: Emergency Medicine

## 2021-01-19 DIAGNOSIS — E860 Dehydration: Secondary | ICD-10-CM | POA: Diagnosis not present

## 2021-01-19 DIAGNOSIS — Z7951 Long term (current) use of inhaled steroids: Secondary | ICD-10-CM | POA: Insufficient documentation

## 2021-01-19 DIAGNOSIS — R059 Cough, unspecified: Secondary | ICD-10-CM | POA: Insufficient documentation

## 2021-01-19 DIAGNOSIS — Z91010 Allergy to peanuts: Secondary | ICD-10-CM | POA: Insufficient documentation

## 2021-01-19 DIAGNOSIS — I5042 Chronic combined systolic (congestive) and diastolic (congestive) heart failure: Secondary | ICD-10-CM | POA: Insufficient documentation

## 2021-01-19 DIAGNOSIS — Z87891 Personal history of nicotine dependence: Secondary | ICD-10-CM | POA: Diagnosis not present

## 2021-01-19 DIAGNOSIS — R1084 Generalized abdominal pain: Secondary | ICD-10-CM | POA: Diagnosis not present

## 2021-01-19 DIAGNOSIS — R0602 Shortness of breath: Secondary | ICD-10-CM | POA: Insufficient documentation

## 2021-01-19 DIAGNOSIS — E1165 Type 2 diabetes mellitus with hyperglycemia: Secondary | ICD-10-CM | POA: Insufficient documentation

## 2021-01-19 DIAGNOSIS — J45909 Unspecified asthma, uncomplicated: Secondary | ICD-10-CM | POA: Insufficient documentation

## 2021-01-19 DIAGNOSIS — R739 Hyperglycemia, unspecified: Secondary | ICD-10-CM

## 2021-01-19 DIAGNOSIS — Z7984 Long term (current) use of oral hypoglycemic drugs: Secondary | ICD-10-CM | POA: Diagnosis not present

## 2021-01-19 DIAGNOSIS — R112 Nausea with vomiting, unspecified: Secondary | ICD-10-CM

## 2021-01-19 DIAGNOSIS — Z91040 Latex allergy status: Secondary | ICD-10-CM | POA: Diagnosis not present

## 2021-01-19 DIAGNOSIS — R1010 Upper abdominal pain, unspecified: Secondary | ICD-10-CM | POA: Diagnosis present

## 2021-01-19 LAB — CBC WITH DIFFERENTIAL/PLATELET
Abs Immature Granulocytes: 0.04 10*3/uL (ref 0.00–0.07)
Basophils Absolute: 0 10*3/uL (ref 0.0–0.1)
Basophils Relative: 0 %
Eosinophils Absolute: 0.2 10*3/uL (ref 0.0–0.5)
Eosinophils Relative: 2 %
HCT: 41.6 % (ref 36.0–46.0)
Hemoglobin: 13.1 g/dL (ref 12.0–15.0)
Immature Granulocytes: 0 %
Lymphocytes Relative: 26 %
Lymphs Abs: 2.4 10*3/uL (ref 0.7–4.0)
MCH: 28.7 pg (ref 26.0–34.0)
MCHC: 31.5 g/dL (ref 30.0–36.0)
MCV: 91.2 fL (ref 80.0–100.0)
Monocytes Absolute: 0.6 10*3/uL (ref 0.1–1.0)
Monocytes Relative: 7 %
Neutro Abs: 6.2 10*3/uL (ref 1.7–7.7)
Neutrophils Relative %: 65 %
Platelets: 332 10*3/uL (ref 150–400)
RBC: 4.56 MIL/uL (ref 3.87–5.11)
RDW: 13.5 % (ref 11.5–15.5)
WBC: 9.5 10*3/uL (ref 4.0–10.5)
nRBC: 0 % (ref 0.0–0.2)

## 2021-01-19 LAB — LIPASE, BLOOD: Lipase: 28 U/L (ref 11–51)

## 2021-01-19 LAB — COMPREHENSIVE METABOLIC PANEL
ALT: 12 U/L (ref 0–44)
AST: 15 U/L (ref 15–41)
Albumin: 3.7 g/dL (ref 3.5–5.0)
Alkaline Phosphatase: 102 U/L (ref 38–126)
Anion gap: 12 (ref 5–15)
BUN: 10 mg/dL (ref 6–20)
CO2: 25 mmol/L (ref 22–32)
Calcium: 9.1 mg/dL (ref 8.9–10.3)
Chloride: 102 mmol/L (ref 98–111)
Creatinine, Ser: 0.77 mg/dL (ref 0.44–1.00)
GFR, Estimated: >60 mL/min (ref >60)
Glucose, Bld: 326 mg/dL — ABNORMAL HIGH (ref 70–99)
Potassium: 4.1 mmol/L (ref 3.5–5.1)
Sodium: 139 mmol/L (ref 135–145)
Total Bilirubin: 0.5 mg/dL (ref 0.3–1.2)
Total Protein: 7.4 g/dL (ref 6.5–8.1)

## 2021-01-19 MED ORDER — IPRATROPIUM-ALBUTEROL 0.5-2.5 (3) MG/3ML IN SOLN: 3.0000 mL | Freq: Once | RESPIRATORY_TRACT | Status: DC

## 2021-01-19 MED ORDER — ONDANSETRON HCL 4 MG/2ML IJ SOLN
4.0000 mg | Freq: Once | INTRAMUSCULAR | Status: AC
  Administered 2021-01-19: 4 mg via INTRAVENOUS
  Filled 2021-01-19: qty 2

## 2021-01-19 MED ORDER — ALBUTEROL SULFATE HFA 108 (90 BASE) MCG/ACT IN AERS: 2.0000 | INHALATION_SPRAY | RESPIRATORY_TRACT | Status: DC | PRN

## 2021-01-19 MED ORDER — SODIUM CHLORIDE 0.9 % IV SOLN
12.5000 mg | Freq: Once | INTRAVENOUS | Status: AC
  Administered 2021-01-20: 12.5 mg via INTRAVENOUS
  Filled 2021-01-19: qty 0.5

## 2021-01-19 MED ORDER — MORPHINE SULFATE (PF) 4 MG/ML IV SOLN
4.0000 mg | Freq: Once | INTRAVENOUS | Status: AC
  Administered 2021-01-19: 4 mg via INTRAVENOUS
  Filled 2021-01-19: qty 1

## 2021-01-19 MED ORDER — SODIUM CHLORIDE 0.9 % IV BOLUS
1000.0000 mL | Freq: Once | INTRAVENOUS | Status: AC
  Administered 2021-01-19: 1000 mL via INTRAVENOUS

## 2021-01-19 MED ORDER — SODIUM CHLORIDE 0.9 % IV BOLUS
1000.0000 mL | Freq: Once | INTRAVENOUS | Status: AC
  Administered 2021-01-20: 1000 mL via INTRAVENOUS

## 2021-01-19 MED ORDER — HYDROCOD POLST-CPM POLST ER 10-8 MG/5ML PO SUER: 5.0000 mL | Freq: Once | ORAL | Status: DC

## 2021-01-19 MED ORDER — HYDROMORPHONE HCL 1 MG/ML IJ SOLN
1.0000 mg | Freq: Once | INTRAMUSCULAR | Status: AC
  Administered 2021-01-20: 1 mg via INTRAVENOUS
  Filled 2021-01-19: qty 1

## 2021-01-19 NOTE — ED Provider Notes (Signed)
[facility] EMERGENCY DEPARTMENT Provider Note   CSN: [id number] Arrival date & time: 01/19/21  1951     History Chief Complaint  Patient presents with  . Shortness of Breath    Kathryn Olson is a 60 y.o. female.  Pt presents to the ED today with sob, cough, n/v.  The pt said she has been sick since she had Covid in January.  Pt has continued to have a cough.  She started vomiting today.  She has been unable to keep down any fluids.  No f/c.  Pt c/o abdominal and back pain.  She is not sure if it is from coughing a lot.        Past Medical History:  Diagnosis Date  . Anxiety   . Asthma   . CHF (congestive heart failure) ([place])   . Chronic abdominal pain   . Chronic headaches   . Complication of anesthesia   . Crohn's disease ([place]) 03/2012   ? as of 06/05/2020: do not see endoscopic evidence or CT evidence of this. ?erroneous?   . Diabetes mellitus without complication ([place])   . Environmental allergies   . Family history of anesthesia complication    MH during C Section  . H/O colonoscopy   . History of electroencephalogram 03/2014   normal  . IBS (irritable bowel syndrome) June 2014  . 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 Migraine   . Obsessive-compulsive disorder   . PTSD (post-traumatic stress disorder)   . Seizures [facility])     Patient Active Problem List   Diagnosis Date Noted  . Encounter for hepatitis C screening test for low risk patient 02/22/2019  . GAD (generalized anxiety disorder) 09/11/2018  . Bipolar disorder ([place]) 09/11/2018  . Chronic tension-type headache, not intractable 10/26/2016  . Seizure disorder ([place]) 10/26/2016  . Crohn's disease of large intestine without complication ([place]) 95/18/8416  . Environmental allergies 10/26/2016  . Chronic combined systolic and diastolic congestive heart failure ([place]) 10/25/2016  . Type 2 diabetes mellitus without complication, without long-term current use of insulin ([place])  10/25/2016  . Pain in right arm 08/04/2016  . Anxiety 07/12/2016  . Gastroesophageal reflux disease without esophagitis 07/12/2016  . Chest wall muscle strain 08/06/2015  . IBS (irritable bowel syndrome) 09/04/2013  . Depression 09/04/2013  . Dysthymic disorder 01/29/2013  . PTSD (post-traumatic stress disorder) 01/29/2013  . Chronic back pain greater than 3 months duration 01/29/2013  . CNS disorder 01/29/2013  . Insomnia secondary to depression with anxiety 01/29/2013  . Insomnia secondary to chronic pain 01/29/2013    Past Surgical History:  Procedure Laterality Date  . BLADDER SURGERY     age 53  . CESAREAN SECTION     X2  . COLONOSCOPY  05/22/2012   SLF: Polyps, multiple hyperplastic in the sigmoid colon/Polyp in the rectum/  MODERATE Diverticulosis throughout the colon/ Internal hemorrhoids  . ESOPHAGOGASTRODUODENOSCOPY     remote, had ulcers, [place], Dr. Truddie Coco  . ESOPHAGOGASTRODUODENOSCOPY  05/22/2012   SLF: Stricture in the distal esophagus/Moderate gastritis  . PARTIAL HYSTERECTOMY    . TUBAL LIGATION       OB History   No obstetric history on file.     Family History  Problem Relation Age of Onset  . Colon cancer Maternal Grandfather        before the age of 31  . Heart attack Father   . Alcohol abuse Father   .  Diabetes Mother   . Hypertension Mother   . Dementia Mother   . Depression Mother   . Liver disease Brother        does not know details, but related to MVA?  Marland Kitchen Anxiety disorder Sister   . Bipolar disorder Sister   . OCD Sister   . Anxiety disorder Sister   . Depression Sister   . Malignant hyperthermia Daughter   . Heart murmur Son   . Inflammatory bowel disease Neg Hx   . ADD / ADHD Neg Hx   . Drug abuse Neg Hx   . Paranoid behavior Neg Hx   . Schizophrenia Neg Hx   . Seizures Neg Hx   . Sexual abuse Neg Hx   . Physical abuse Neg Hx   . Colon polyps Neg Hx     Social History   Tobacco Use  . Smoking status: Former Smoker     Quit date: 01/02/2011    Years since quitting: 10.0  . Smokeless tobacco: Never Used  . Tobacco comment: Quit a few years ago  Vaping Use  . Vaping Use: Never used  Substance Use Topics  . Alcohol use: No  . Drug use: No    Home Medications Prior to Admission medications   Medication Sig Start Date End Date Taking? Authorizing Provider  albuterol (PROVENTIL HFA;VENTOLIN HFA) 108 (90 Base) MCG/ACT inhaler Inhale 2 puffs into the lungs every 6 (six) hours as needed for wheezing or shortness of breath.    [provider]  albuterol (PROVENTIL) (5 MG/ML) 0.5% nebulizer solution Take 2.5 mg by nebulization every 6 (six) hours as needed for wheezing or shortness of breath.    [provider]  ALPRAZolam Duanne Moron) 0.5 MG tablet Take 1 tablet (0.5 mg total) by mouth 3 (three) times daily as needed for anxiety. 12/20/19   Donnal Moat T, PA-C  amitriptyline (ELAVIL) 50 MG tablet Take 1 tablet by mouth daily. 09/12/16   [provider]  carvedilol (COREG) 6.25 MG tablet TAKE 1 TABLET BY MOUTH TWICE A DAY 01/01/21   Camnitz, Ocie Doyne, MD  diphenhydrAMINE (BENADRYL) 25 MG tablet Take 25 mg by mouth every 6 (six) hours as needed for itching or allergies.    [provider]  EPINEPHrine 0.3 mg/0.3 mL IJ SOAJ injection Inject 0.3 mg into the muscle as needed.    [provider]  Fluticasone-Umeclidin-Vilant (TRELEGY ELLIPTA) 200-62.5-25 MCG/INH AEPB Inhale 1 puff into the lungs daily. 01/08/21   Hunsucker, Bonna Gains, MD  Fluticasone-Umeclidin-Vilant (TRELEGY ELLIPTA) 200-62.5-25 MCG/INH AEPB Inhale 1 puff into the lungs daily. 01/08/21   Hunsucker, Bonna Gains, MD  furosemide (LASIX) 40 MG tablet Take 40 mg by mouth daily.  07/12/16   [provider]  glyBURIDE-metformin (GLUCOVANCE) 2.5-500 MG tablet Take 2 tablets by mouth 2 (two) times daily. 03/14/20   [provider]  guaifenesin (ROBITUSSIN) 100 MG/5ML syrup Take 30 mLs by mouth 3 (three)  times daily as needed for cough.    [provider]  guaiFENesin-codeine (ROBITUSSIN AC) 100-10 MG/5ML syrup Take 5 mLs by mouth 4 (four) times daily as needed for cough.    [provider]  HYDROcodone-homatropine (HYDROMET) 5-1.5 MG/5ML syrup Take 5 mLs by mouth daily as needed for cough. At night. 01/08/21   Hunsucker, Bonna Gains, MD  hydrocortisone 2.5 % ointment APPLY 1 APPLICATION 4 TIMES A DAY 07/19/17   Carlis Stable, NP  hydrOXYzine (VISTARIL) 25 MG capsule Take 25 mg by mouth daily  as needed for itching.    [provider]  levocetirizine (XYZAL) 5 MG tablet Take 5 mg by mouth every evening.    [provider]  linaclotide (LINZESS) 290 MCG CAPS capsule Take 290 mcg by mouth daily. 10/25/16   [provider]  losartan (COZAAR) 25 MG tablet Take 1 tablet (25 mg total) by mouth daily. 01/01/20   Camnitz, Ocie Doyne, MD  omeprazole (PRILOSEC) 20 MG capsule Take 20 mg by mouth at bedtime.    [provider]  QUEtiapine (SEROQUEL) 400 MG tablet Take 2 tablets (800 mg total) by mouth at bedtime. 12/20/19   Donnal Moat T, PA-C  rosuvastatin (CRESTOR) 10 MG tablet rosuvastatin 10 mg tablet  Take 1 tablet every day by oral route at bedtime for 90 days.    [provider]  sertraline (ZOLOFT) 100 MG tablet Take 2 tablets (200 mg total) by mouth daily. 05/04/20   Hurst, Dorothea Glassman, PA-C  UNABLE TO FIND CPAP at bedtime    [provider]    Allergies    Aspirin, Peanut oil, Peanut-containing drug products, Penicillins, Sulfa antibiotics, Sulfamethoxazole, Adhesive [tape], Codeine, Latex, and Rubbing alcohol [alcohol]  Review of Systems   Review of Systems  Respiratory: Positive for cough and shortness of breath.   Gastrointestinal: Positive for nausea and vomiting.  All other systems reviewed and are negative.   Physical Exam Updated Vital Signs BP (!) 154/120   Pulse (!) 103   Temp 99 F (37.2 C) (Oral)   Resp 19   Ht 5'  2" (1.575 m)   Wt 98 kg   SpO2 99%   BMI 39.52 kg/m   Physical Exam Vitals and nursing note reviewed.  Constitutional:      Appearance: She is well-developed.  HENT:     Head: Normocephalic and atraumatic.     Mouth/Throat:     Mouth: Mucous membranes are moist.  Eyes:     Extraocular Movements: Extraocular movements intact.     Pupils: Pupils are equal, round, and reactive to light.  Cardiovascular:     Rate and Rhythm: Normal rate and regular rhythm.  Pulmonary:     Breath sounds: Wheezing present.  Abdominal:     General: Bowel sounds are normal.     Palpations: Abdomen is soft.     Tenderness: There is abdominal tenderness in the left lower quadrant.  Musculoskeletal:        General: Normal range of motion.     Cervical back: Normal range of motion and neck supple.  Skin:    General: Skin is warm.     Capillary Refill: Capillary refill takes less than 2 seconds.  Neurological:     General: No focal deficit present.     Mental Status: She is alert and oriented to person, place, and time.  Psychiatric:        Mood and Affect: Mood normal.        Behavior: Behavior normal.     ED Results / Procedures / Treatments   Labs (all labs ordered are listed, but only abnormal results are displayed) Labs Reviewed  COMPREHENSIVE METABOLIC PANEL - Abnormal; Notable for the following components:      Result Value   Glucose, Bld 326 (*)    All other components within normal limits  CBC WITH DIFFERENTIAL/PLATELET  LIPASE, BLOOD  URINALYSIS, ROUTINE W REFLEX MICROSCOPIC    EKG EKG Interpretation  Date/Time:  Monday January 19 2021 20:21:22 EDT Ventricular Rate:  87 PR Interval:  126 QRS Duration: 74 QT Interval:  364 QTC Calculation: 438 R Axis:   61 Text Interpretation: Normal sinus rhythm Normal ECG Confirmed by Isla Pence [phone]) on 01/19/2021 8:30:42 PM   Radiology DG Chest 2 View  Result Date: 01/19/2021 CLINICAL DATA:  Cough, congestion, shortness of breath  EXAM: CHEST - 2 VIEW COMPARISON:  05/17/2017 FINDINGS: Interstitial prominence in the upper lobes, similar to prior study. No acute confluent opacities or effusions. Heart is normal size. No acute bony abnormality. IMPRESSION: No active cardiopulmonary disease. Electronically Signed   By: Rolm Baptise M.D.   On: 01/19/2021 21:12    Procedures Procedures   Medications Ordered in ED Medications  albuterol (VENTOLIN HFA) 108 (90 Base) MCG/ACT inhaler 2 puff (has no administration in time range)  HYDROmorphone (DILAUDID) injection 1 mg (has no administration in time range)  promethazine (PHENERGAN) 12.5 mg in sodium chloride 0.9 % 50 mL IVPB (has no administration in time range)  sodium chloride 0.9 % bolus 1,000 mL (has no administration in time range)  ipratropium-albuterol (DUONEB) 0.5-2.5 (3) MG/3ML nebulizer solution 3 mL (has no administration in time range)  ondansetron (ZOFRAN) injection 4 mg (4 mg Intravenous Given 01/19/21 2338)  sodium chloride 0.9 % bolus 1,000 mL (1,000 mLs Intravenous New Bag/Given 01/19/21 2337)  morphine 4 MG/ML injection 4 mg (4 mg Intravenous Given 01/19/21 2338)    ED Course  I have reviewed the triage vital signs and the nursing notes.  Pertinent labs & imaging results that were available during my care of the patient were reviewed by me and considered in my medical decision making (see chart for details).    MDM Rules/Calculators/A&P                          Pt is not feeling any better after initial round of zofran and morphine.  BS is elevated.  Pt said she's been taking her meds, but she has been throwing them up.  Abd pain is likely from n/v, but CT abd/pelvis added.  Pt given additional IVFs and meds.  Pt signed out to Dr. Stark Jock at shift change.  Final Clinical Impression(s) / ED Diagnoses Final diagnoses:  Generalized abdominal pain  Non-intractable vomiting with nausea, unspecified vomiting type  Dehydration  Hyperglycemia    Rx / DC  Orders ED Discharge Orders    None       Isla Pence, MD 01/19/21 2352

## 2021-01-19 NOTE — ED Triage Notes (Signed)
Pt with c/o cough, congestion, SOB, and vomiting x 1 week.

## 2021-01-20 ENCOUNTER — Emergency Department (HOSPITAL_COMMUNITY): Payer: Medicare HMO

## 2021-01-20 LAB — URINALYSIS, ROUTINE W REFLEX MICROSCOPIC
Bacteria, UA: NONE SEEN
Bilirubin Urine: NEGATIVE
Glucose, UA: >=500 mg/dL — AB
Hgb urine dipstick: NEGATIVE
Ketones, ur: 5 mg/dL — AB
Leukocytes,Ua: NEGATIVE
Nitrite: NEGATIVE
Protein, ur: NEGATIVE mg/dL
Specific Gravity, Urine: 1.025 (ref 1.005–1.030)
pH: 5 (ref 5.0–8.0)

## 2021-01-20 MED ORDER — ALBUTEROL SULFATE HFA 108 (90 BASE) MCG/ACT IN AERS
2.0000 | INHALATION_SPRAY | Freq: Once | RESPIRATORY_TRACT | Status: AC
  Administered 2021-01-20: 2 via RESPIRATORY_TRACT
  Filled 2021-01-20: qty 6.7

## 2021-01-20 MED ORDER — METHYLPREDNISOLONE SODIUM SUCC 125 MG IJ SOLR
125.0000 mg | Freq: Once | INTRAMUSCULAR | Status: AC
  Administered 2021-01-20: 125 mg via INTRAVENOUS
  Filled 2021-01-20: qty 2

## 2021-01-20 MED ORDER — PROMETHAZINE HCL 25 MG/ML IJ SOLN
INTRAMUSCULAR | Status: AC
  Filled 2021-01-20: qty 1

## 2021-01-20 MED ORDER — HYDROCODONE-HOMATROPINE 5-1.5 MG/5ML PO SYRP: 5.0000 mL | ORAL_SOLUTION | Freq: Four times a day (QID) | ORAL | 0 refills | Status: DC | PRN

## 2021-01-20 MED ORDER — IOHEXOL 300 MG/ML  SOLN
100.0000 mL | Freq: Once | INTRAMUSCULAR | Status: AC | PRN
  Administered 2021-01-20: 100 mL via INTRAVENOUS

## 2021-01-20 MED ORDER — PREDNISONE 10 MG PO TABS: 20.0000 mg | ORAL_TABLET | Freq: Two times a day (BID) | ORAL | 0 refills | Status: DC

## 2021-01-20 NOTE — Discharge Instructions (Addendum)
Use your albuterol inhaler, 2 puffs every 4 hours as needed for wheezing/difficulty breathing.  Take Hycodan as needed for cough.  Use this medication primarily at night to help you sleep.  Return to the emergency department if symptoms significantly worsen or change.

## 2021-01-20 NOTE — ED Provider Notes (Signed)
Care assumed from Dr. Gilford Raid at shift change.  Patient is awaiting results of urinalysis and CT scan.  Both of these studies have returned and are unremarkable.  Patient presents here with ongoing issues with cough, shortness of breath, abdominal discomfort since being diagnosed with Covid in January.  She does seem to be feeling better today after receiving albuterol.  I am uncertain as to the etiology of her symptoms, whether this is extended Covid or possibly some other process, but nothing today appears emergent.  Her biggest complaint now seems to be cough and difficulty sleeping secondary to her cough.  Patient will be given Solu-Medrol here and discharged with prednisone and Robitussin with codeine.  She is to follow-up with primary doctor if not improving.   Veryl Speak, MD 01/20/21 [phone]

## 2021-01-20 NOTE — ED Notes (Signed)
Pt to CT

## 2021-02-25 ENCOUNTER — Ambulatory Visit
Admission: RE | Admit: 2021-02-25 | Discharge: 2021-02-25 | Disposition: A | Discharge status: Home / Self Care | Payer: Medicare HMO | Source: Ambulatory Visit | Attending: Family Medicine | Admitting: Family Medicine

## 2021-02-25 ENCOUNTER — Other Ambulatory Visit: Payer: Self-pay

## 2021-02-25 DIAGNOSIS — Z1231 Encounter for screening mammogram for malignant neoplasm of breast: Secondary | ICD-10-CM

## 2021-03-11 ENCOUNTER — Encounter: Payer: Self-pay | Admitting: Primary Care

## 2021-03-11 ENCOUNTER — Other Ambulatory Visit: Payer: Self-pay

## 2021-03-11 ENCOUNTER — Ambulatory Visit (INDEPENDENT_AMBULATORY_CARE_PROVIDER_SITE_OTHER): Payer: Medicare HMO | Admitting: Primary Care

## 2021-03-11 VITALS — BP 160/84 | HR 97 | Ht 62.0 in | Wt 204.0 lb

## 2021-03-11 DIAGNOSIS — M545 Low back pain, unspecified: Secondary | ICD-10-CM | POA: Insufficient documentation

## 2021-03-11 DIAGNOSIS — Z8719 Personal history of other diseases of the digestive system: Secondary | ICD-10-CM | POA: Insufficient documentation

## 2021-03-11 DIAGNOSIS — J4540 Moderate persistent asthma, uncomplicated: Secondary | ICD-10-CM

## 2021-03-11 DIAGNOSIS — G8929 Other chronic pain: Secondary | ICD-10-CM | POA: Insufficient documentation

## 2021-03-11 DIAGNOSIS — I10 Essential (primary) hypertension: Secondary | ICD-10-CM

## 2021-03-11 DIAGNOSIS — J4530 Mild persistent asthma, uncomplicated: Secondary | ICD-10-CM

## 2021-03-11 DIAGNOSIS — J309 Allergic rhinitis, unspecified: Secondary | ICD-10-CM | POA: Diagnosis not present

## 2021-03-11 DIAGNOSIS — H903 Sensorineural hearing loss, bilateral: Secondary | ICD-10-CM | POA: Insufficient documentation

## 2021-03-11 DIAGNOSIS — H9313 Tinnitus, bilateral: Secondary | ICD-10-CM | POA: Insufficient documentation

## 2021-03-11 DIAGNOSIS — G4733 Obstructive sleep apnea (adult) (pediatric): Secondary | ICD-10-CM | POA: Insufficient documentation

## 2021-03-11 HISTORY — DX: Essential (primary) hypertension: I10

## 2021-03-11 LAB — CBC WITH DIFFERENTIAL/PLATELET
Basophils Absolute: 0.1 10*3/uL (ref 0.0–0.1)
Basophils Relative: 0.7 % (ref 0.0–3.0)
Eosinophils Absolute: 0.1 10*3/uL (ref 0.0–0.7)
Eosinophils Relative: 1.1 % (ref 0.0–5.0)
HCT: 40.9 % (ref 36.0–46.0)
Hemoglobin: 13.6 g/dL (ref 12.0–15.0)
Lymphocytes Relative: 27.8 % (ref 12.0–46.0)
Lymphs Abs: 2.3 10*3/uL (ref 0.7–4.0)
MCHC: 33.3 g/dL (ref 30.0–36.0)
MCV: 87.2 fl (ref 78.0–100.0)
Monocytes Absolute: 0.4 10*3/uL (ref 0.1–1.0)
Monocytes Relative: 4.9 % (ref 3.0–12.0)
Neutro Abs: 5.4 10*3/uL (ref 1.4–7.7)
Neutrophils Relative %: 65.5 % (ref 43.0–77.0)
Platelets: 212 10*3/uL (ref 150.0–400.0)
RBC: 4.68 Mil/uL (ref 3.87–5.11)
RDW: 13.9 % (ref 11.5–15.5)
WBC: 8.2 10*3/uL (ref 4.0–10.5)

## 2021-03-11 MED ORDER — TRELEGY ELLIPTA 200-62.5-25 MCG/INH IN AEPB: 1.0000 | INHALATION_SPRAY | Freq: Every day | RESPIRATORY_TRACT | 3 refills | Status: DC

## 2021-03-11 MED ORDER — ALBUTEROL SULFATE HFA 108 (90 BASE) MCG/ACT IN AERS: 2.0000 | INHALATION_SPRAY | Freq: Four times a day (QID) | RESPIRATORY_TRACT | 2 refills | Status: AC | PRN

## 2021-03-11 MED ORDER — FLUTICASONE PROPIONATE 50 MCG/ACT NA SUSP: 1.0000 | Freq: Every day | NASAL | 2 refills | Status: DC

## 2021-03-11 MED ORDER — TRELEGY ELLIPTA 200-62.5-25 MCG/INH IN AEPB: 1.0000 | INHALATION_SPRAY | Freq: Every day | RESPIRATORY_TRACT | 0 refills | Status: DC

## 2021-03-11 NOTE — Assessment & Plan Note (Addendum)
-   Continues to have a cough. Using SABA 3-4 times a day with improvement.  - Resume Trelegy 200 one puff daily in morning  - Use albuterol hfa every 4-6 hours for breakthrough shortness of breath/wheezing  - Take regular Mucinex OTC  633m 1-2 tablets twice a day (take with glass of water) - FU in 4 weeks with PFTs

## 2021-03-11 NOTE — Addendum Note (Signed)
Addended by: Amado Coe on: 03/11/2021 03:01 PM   Modules accepted: Orders

## 2021-03-11 NOTE — Patient Instructions (Addendum)
Orders: - Labs today (allergy testing, CBC) - Pulmonary function test (ordered)  Recommendations: - Resume Trelegy 1 puff daily in morning  - Use albuterol every 4-6 hours for breakthrough shortness of breath/wheezing  - Start Flonase nasal spray - Take regular Mucinex OTC  88m - take 1-2 tablets twice a day (take with glass of water) - IF allergy testing is positive will start medication called Singulair   Follow-up: - Please schedule 1 month visit with Dr. HSilas Flood(June 24th) with PFTs

## 2021-03-11 NOTE — Assessment & Plan Note (Signed)
-   Significant post nasal drip symptoms likely exacerbating upperairway cough  - Start Flonase 1 spray per nostril once daily  - Continue OTC antihistamine  - Checking resp allergy panel/CBC with diff; if allergy testing positive will recommend starting Singulair 2m qhs

## 2021-03-11 NOTE — Addendum Note (Signed)
Addended by: Suzzanne Cloud E on: 03/11/2021 02:57 PM   Modules accepted: Orders

## 2021-03-11 NOTE — Progress Notes (Signed)
@Patient  ID: Kathryn Olson, female    DOB: 1961/05/11, 60 y.o.   MRN: [id number]  Chief Complaint  Patient presents with  . Cough    Productive, using humidifier, ceiling fan and purifier    Referring provider: Emelda Fear, DO  HPI: 60 year old female, former smoker quit in March 2012.  Past medical history significant for moderate persistent asthma, chronic combined systolic and diastolic heart failure, Crohn's disease, GERD, type 2 diabetes, seizure disorder.  Patient of Dr. Silas Flood, seen for initial consult on 01/08/2021.  03/11/2021  Patient presents today for 73-monthfollow-up. She had covid in January 2022. She was started on Trelegy Ellipta inhaler, given samples. She has been off of this for several months. She has been using Albuterol 3-4 times a day with improvement. She has a lot of post nasal drip and nasal congestion. She is not taking any over the counter nasal sprays or mucinex. She takes prevacid and will be seeing GI in September.    Allergies  Allergen Reactions  . Aspirin Anaphylaxis and Swelling  . Lithium Bromide [Lithium] Anaphylaxis  . Peanut Oil Anaphylaxis  . Peanut-Containing Drug Products Anaphylaxis       . Penicillins Anaphylaxis    Stops her heart. Has patient had a PCN reaction causing immediate rash, facial/tongue/throat swelling, SOB or lightheadedness with hypotension: yes Has patient had a PCN reaction causing severe rash involving mucus membranes or skin necrosis: no Has patient had a PCN reaction that required hospitalization: yes Has patient had a PCN reaction occurring within the last 10 years: yes If all of the above answers are "NO", then may proceed with Cephalosporin use.  Other reaction(s): Other (See Comments)  . Sulfa Antibiotics Anaphylaxis  . Sulfamethoxazole Anaphylaxis and Itching  . Other     Other reaction(s): anaphylaxis Other reaction(s): Other (See Comments)  . Adhesive [Tape] Rash  . Codeine Itching and Rash  . Latex  Rash  . Rubbing Alcohol [Alcohol] Rash    Immunization History  Administered Date(s) Administered  . Influenza Inj Mdck Quad Pf 09/13/2016  . Pneumococcal Polysaccharide-23 04/12/2017    Past Medical History:  Diagnosis Date  . Anxiety   . Asthma   . CHF (congestive heart failure) ([place]   . Chronic abdominal pain   . Chronic headaches   . Complication of anesthesia   . Crohn's disease ([place] 03/2012   ? as of 06/05/2020: do not see endoscopic evidence or CT evidence of this. ?erroneous?   . Diabetes mellitus without complication ([place]   . Environmental allergies   . Essential hypertension 03/11/2021  . Family history of anesthesia complication    MH during C Section  . H/O colonoscopy   . History of electroencephalogram 03/2014   normal  . IBS (irritable bowel syndrome) June 2014  . 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 Migraine   . Obsessive-compulsive disorder   . PTSD (post-traumatic stress disorder)   . Seizures ([place]     Tobacco History: Social History   Tobacco Use  Smoking Status Former Smoker  . Quit date: 01/02/2011  . Years since quitting: 10.1  Smokeless Tobacco Never Used  Tobacco Comment   Quit a few years ago   Counseling given: Not Answered Comment: Quit a few years ago   Outpatient Medications Prior to Visit  Medication Sig Dispense Refill  . albuterol (PROVENTIL) (5 MG/ML) 0.5% nebulizer solution Take 2.5 mg by nebulization every 6 (six) hours  as needed for wheezing or shortness of breath.    . ALPRAZolam (XANAX) 0.5 MG tablet Take 1 tablet (0.5 mg total) by mouth 3 (three) times daily as needed for anxiety. 90 tablet 2  . amitriptyline (ELAVIL) 50 MG tablet Take 1 tablet by mouth daily.    . carvedilol (COREG) 6.25 MG tablet TAKE 1 TABLET BY MOUTH TWICE A DAY 60 tablet 8  . diphenhydrAMINE (BENADRYL) 25 MG tablet Take 25 mg by mouth every 6 (six) hours as needed for itching or allergies.    Marland Kitchen EPINEPHrine 0.3  mg/0.3 mL IJ SOAJ injection Inject 0.3 mg into the muscle as needed.    . Fluticasone-Umeclidin-Vilant (TRELEGY ELLIPTA) 200-62.5-25 MCG/INH AEPB Inhale 1 puff into the lungs daily. 60 each 3  . Fluticasone-Umeclidin-Vilant (TRELEGY ELLIPTA) 200-62.5-25 MCG/INH AEPB Inhale 1 puff into the lungs daily. 2 each 0  . furosemide (LASIX) 40 MG tablet Take 40 mg by mouth daily.     Marland Kitchen glyBURIDE-metformin (GLUCOVANCE) 2.5-500 MG tablet Take 2 tablets by mouth 2 (two) times daily.    Marland Kitchen guaifenesin (ROBITUSSIN) 100 MG/5ML syrup Take 30 mLs by mouth 3 (three) times daily as needed for cough.    Marland Kitchen guaiFENesin-codeine (ROBITUSSIN AC) 100-10 MG/5ML syrup Take 5 mLs by mouth 4 (four) times daily as needed for cough.    Marland Kitchen HYDROcodone-homatropine (HYCODAN) 5-1.5 MG/5ML syrup Take 5 mLs by mouth every 6 (six) hours as needed for cough. 120 mL 0  . hydrocortisone 2.5 % ointment APPLY 1 APPLICATION 4 TIMES A DAY 40 g 1  . hydrOXYzine (VISTARIL) 25 MG capsule Take 25 mg by mouth daily as needed for itching.    . levocetirizine (XYZAL) 5 MG tablet Take 5 mg by mouth every evening.    . linaclotide (LINZESS) 290 MCG CAPS capsule Take 290 mcg by mouth daily.    Marland Kitchen losartan (COZAAR) 25 MG tablet Take 1 tablet (25 mg total) by mouth daily. 30 tablet 11  . omeprazole (PRILOSEC) 20 MG capsule Take 20 mg by mouth at bedtime.    . predniSONE (DELTASONE) 10 MG tablet Take 2 tablets (20 mg total) by mouth 2 (two) times daily. 20 tablet 0  . QUEtiapine (SEROQUEL) 400 MG tablet Take 2 tablets (800 mg total) by mouth at bedtime. 180 tablet 1  . rosuvastatin (CRESTOR) 10 MG tablet rosuvastatin 10 mg tablet  Take 1 tablet every day by oral route at bedtime for 90 days.    Marland Kitchen sertraline (ZOLOFT) 100 MG tablet Take 2 tablets (200 mg total) by mouth daily. 180 tablet 0  . UNABLE TO FIND CPAP at bedtime    . albuterol (PROVENTIL HFA;VENTOLIN HFA) 108 (90 Base) MCG/ACT inhaler Inhale 2 puffs into the lungs every 6 (six) hours as needed for  wheezing or shortness of breath.     No facility-administered medications prior to visit.   Review of Systems  Review of Systems  Constitutional: Negative.   HENT: Positive for postnasal drip.   Respiratory: Positive for cough.    Physical Exam  BP (!) 160/84   Pulse 97   Ht 5' 2"  (1.575 m)   Wt 204 lb (92.5 kg)   SpO2 96%   BMI 37.31 kg/m  Physical Exam Constitutional:      Appearance: Normal appearance.  HENT:     Nose:     Comments: Audible nasal congestion  Cardiovascular:     Rate and Rhythm: Normal rate and regular rhythm.  Pulmonary:     Effort:  Pulmonary effort is normal.     Breath sounds: Normal breath sounds.     Comments: CTA; Congested cough  Neurological:     Mental Status: She is alert.      Lab Results:  CBC    Component Value Date/Time   WBC 9.5 01/19/2021 2234   RBC 4.56 01/19/2021 2234   HGB 13.1 01/19/2021 2234   HCT 41.6 01/19/2021 2234   HCT 43 05/29/2012 0916   PLT 332 01/19/2021 2234   MCV 91.2 01/19/2021 2234   MCV 87.2 09/04/2013 1726   MCV 88.0 05/29/2012 0916   MCH 28.7 01/19/2021 2234   MCHC 31.5 01/19/2021 2234   RDW 13.5 01/19/2021 2234   LYMPHSABS 2.4 01/19/2021 2234   MONOABS 0.6 01/19/2021 2234   EOSABS 0.2 01/19/2021 2234   BASOSABS 0.0 01/19/2021 2234    BMET    Component Value Date/Time   NA 139 01/19/2021 2234   NA 136 12/31/2019 1130   K 4.1 01/19/2021 2234   K 4.2 05/29/2012 0916   CL 102 01/19/2021 2234   CL 103 05/29/2012 0916   CO2 25 01/19/2021 2234   CO2 27 05/29/2012 0916   GLUCOSE 326 (H) 01/19/2021 2234   BUN 10 01/19/2021 2234   BUN 13 12/31/2019 1130   CREATININE 0.77 01/19/2021 2234   CREATININE 0.70 05/29/2012 0916   CALCIUM 9.1 01/19/2021 2234   CALCIUM 9.8 05/29/2012 0916   GFRNONAA >60 01/19/2021 2234   GFRAA 76 12/31/2019 1130    BNP    Component Value Date/Time   BNP 66.6 01/04/2021 1420    ProBNP    Component Value Date/Time   PROBNP 24 06/26/2018 1243   PROBNP 5.0  11/17/2011 2305    Imaging: MM 3D SCREEN BREAST BILATERAL  Result Date: 02/25/2021 CLINICAL DATA:  Screening. EXAM: DIGITAL SCREENING BILATERAL MAMMOGRAM WITH TOMOSYNTHESIS AND CAD TECHNIQUE: Bilateral screening digital craniocaudal and mediolateral oblique mammograms were obtained. Bilateral screening digital breast tomosynthesis was performed. The images were evaluated with computer-aided detection. COMPARISON:  Previous exam(s). ACR Breast Density Category c: The breast tissue is heterogeneously dense, which may obscure small masses. FINDINGS: There are no findings suspicious for malignancy. The images were evaluated with computer-aided detection. IMPRESSION: No mammographic evidence of malignancy. A result letter of this screening mammogram will be mailed directly to the patient. RECOMMENDATION: Screening mammogram in one year. (Code:SM-B-01Y) BI-RADS CATEGORY  1: Negative. Electronically Signed   By: Ammie Ferrier M.D.   On: 02/25/2021 14:24     Assessment & Plan:   Mild persistent asthma, uncomplicated - Continues to have a cough. Using SABA 3-4 times a day with improvement.  - Resume Trelegy 200 one puff daily in morning  - Use albuterol hfa every 4-6 hours for breakthrough shortness of breath/wheezing  - Take regular Mucinex OTC  682m 1-2 tablets twice a day (take with glass of water) - FU in 4 weeks with PFTs   Allergic rhinitis - Significant post nasal drip symptoms likely exacerbating upperairway cough  - Start Flonase 1 spray per nostril once daily  - Continue OTC antihistamine  - Checking resp allergy panel/CBC with diff; if allergy testing positive will recommend starting Singulair 134mqhs    ElMartyn EhrichNP 03/11/2021

## 2021-03-12 LAB — RESPIRATORY ALLERGY PROFILE REGION II ~~LOC~~
Allergen, A. alternata, m6: <0.1 kU/L
Allergen, Cedar tree, t12: <0.1 kU/L
Allergen, Comm Silver Birch, t9: <0.1 kU/L
Allergen, Cottonwood, t14: <0.1 kU/L
Allergen, D pternoyssinus,d7: <0.1 kU/L
Allergen, Mouse Urine Protein, e78: <0.1 kU/L
Allergen, Mulberry, t76: <0.1 kU/L
Allergen, Oak,t7: <0.1 kU/L
Allergen, P. notatum, m1: <0.1 kU/L
Aspergillus fumigatus, m3: <0.1 kU/L
Bermuda Grass: <0.1 kU/L
Box Elder IgE: <0.1 kU/L
CLADOSPORIUM HERBARUM (M2) IGE: <0.1 kU/L
COMMON RAGWEED (SHORT) (W1) IGE: 0.26 kU/L — ABNORMAL HIGH
Cat Dander: <0.1 kU/L
Class: 0
Class: 0
Class: 0
Class: 0
Class: 0
Class: 0
Class: 0
Class: 0
Class: 0
Class: 0
Class: 0
Class: 0
Class: 0
Class: 0
Class: 0
Class: 0
Class: 0
Class: 0
Class: 0
Class: 0
Class: 0
Class: 0
Class: 0
Cockroach: <0.1 kU/L
D. farinae: <0.1 kU/L
Dog Dander: <0.1 kU/L
Elm IgE: <0.1 kU/L
IgE (Immunoglobulin E), Serum: 25 kU/L (ref <=114)
Johnson Grass: <0.1 kU/L
Pecan/Hickory Tree IgE: <0.1 kU/L
Rough Pigweed  IgE: <0.1 kU/L
Sheep Sorrel IgE: <0.1 kU/L
Timothy Grass: <0.1 kU/L

## 2021-03-12 LAB — INTERPRETATION:

## 2021-03-13 ENCOUNTER — Encounter: Payer: Self-pay | Admitting: *Deleted

## 2021-03-13 NOTE — Progress Notes (Signed)
Allergy panel was normal except for ragweed

## 2021-03-27 ENCOUNTER — Other Ambulatory Visit (HOSPITAL_COMMUNITY)
Admission: RE | Admit: 2021-03-27 | Discharge: 2021-03-27 | Disposition: A | Discharge status: Home / Self Care | Payer: Medicare HMO | Source: Ambulatory Visit | Attending: Primary Care | Admitting: Primary Care

## 2021-03-27 DIAGNOSIS — Z20822 Contact with and (suspected) exposure to covid-19: Secondary | ICD-10-CM | POA: Diagnosis not present

## 2021-03-27 DIAGNOSIS — Z01812 Encounter for preprocedural laboratory examination: Secondary | ICD-10-CM | POA: Insufficient documentation

## 2021-03-27 LAB — SARS CORONAVIRUS 2 (TAT 6-24 HRS): SARS Coronavirus 2: NEGATIVE

## 2021-03-31 ENCOUNTER — Other Ambulatory Visit: Payer: Self-pay

## 2021-03-31 ENCOUNTER — Ambulatory Visit (INDEPENDENT_AMBULATORY_CARE_PROVIDER_SITE_OTHER): Payer: Medicare HMO | Admitting: Pulmonary Disease

## 2021-03-31 DIAGNOSIS — J4540 Moderate persistent asthma, uncomplicated: Secondary | ICD-10-CM

## 2021-03-31 LAB — PULMONARY FUNCTION TEST
DL/VA % pred: 133 %
DL/VA: 5.7 ml/min/mmHg/L
DLCO cor % pred: 109 %
DLCO cor: 20.58 ml/min/mmHg
DLCO unc % pred: 110 %
DLCO unc: 20.71 ml/min/mmHg
FEF 25-75 Pre: 1.19 L/sec
FEF2575-%Pred-Pre: 62 %
FEV1-%Change-Post: -23 %
FEV1-%Pred-Post: 47 %
FEV1-%Pred-Pre: 62 %
FEV1-Post: 0.9 L
FEV1-Pre: 1.17 L
FEV1FVC-%Change-Post: 6 %
FEV1FVC-%Pred-Pre: 99 %
FEV6-%Change-Post: -27 %
FEV6-%Pred-Post: 46 %
FEV6-%Pred-Pre: 64 %
FEV6-Post: 1.08 L
FEV6-Pre: 1.49 L
FEV6FVC-%Pred-Post: 103 %
FEV6FVC-%Pred-Pre: 103 %
FVC-%Change-Post: -27 %
FVC-%Pred-Post: 44 %
FVC-%Pred-Pre: 61 %
FVC-Post: 1.08 L
FVC-Pre: 1.49 L
Post FEV1/FVC ratio: 84 %
Post FEV6/FVC ratio: 100 %
Pre FEV1/FVC ratio: 79 %
Pre FEV6/FVC Ratio: 100 %
RV % pred: 118 %
RV: 2.26 L
TLC % pred: 95 %
TLC: 4.53 L

## 2021-03-31 NOTE — Progress Notes (Signed)
Please let patient know pft showed moderate restriction. No bronchodilator response. Dr. Silas Flood will review at her visit in July. No changes right now

## 2021-03-31 NOTE — Patient Instructions (Signed)
Full PFT performed today. °

## 2021-03-31 NOTE — Progress Notes (Signed)
Full PFT performed today. °

## 2021-04-17 ENCOUNTER — Ambulatory Visit: Payer: Medicare HMO | Admitting: Physician Assistant

## 2021-05-06 ENCOUNTER — Ambulatory Visit: Payer: Medicare HMO | Admitting: Pulmonary Disease

## 2021-07-07 ENCOUNTER — Ambulatory Visit: Payer: Medicare HMO | Admitting: Gastroenterology

## 2021-08-05 ENCOUNTER — Other Ambulatory Visit: Payer: Self-pay

## 2021-08-05 ENCOUNTER — Encounter: Payer: Self-pay | Admitting: Gastroenterology

## 2021-08-05 ENCOUNTER — Ambulatory Visit (INDEPENDENT_AMBULATORY_CARE_PROVIDER_SITE_OTHER): Payer: Medicare HMO | Admitting: Gastroenterology

## 2021-08-05 VITALS — BP 133/77 | HR 70 | Temp 97.2°F | Ht 62.0 in | Wt 221.4 lb

## 2021-08-05 DIAGNOSIS — K219 Gastro-esophageal reflux disease without esophagitis: Secondary | ICD-10-CM | POA: Diagnosis not present

## 2021-08-05 DIAGNOSIS — K5900 Constipation, unspecified: Secondary | ICD-10-CM | POA: Diagnosis not present

## 2021-08-05 NOTE — Patient Instructions (Signed)
Continue omeprazole (Prilosec) daily.  Continue Linzess as you are doing.  We will see you back next August 2023 for routine visit and can arrange a colonoscopy at that time!  I enjoyed seeing you again today! As you know, I value our relationship and want to provide genuine, compassionate, and quality care. I welcome your feedback. If you receive a survey regarding your visit,  I greatly appreciate you taking time to fill this out. See you next time!  Annitta Needs, PhD, ANP-BC [facility]

## 2021-08-05 NOTE — Progress Notes (Signed)
Referring Provider: Emelda Fear, DO Primary Care Physician:  Emelda Fear, DO Primary GI: Dr. Abbey Chatters   Chief Complaint  Patient presents with   Follow-up    HPI:   Kathryn Olson is a 60 y.o. female presenting today with a history of IBS-C and GERD. Due for colonoscopy in 2023.  GERD: well controlled on omeprazole daily. No dysphagia.   Constipation: takes LInzess 290 mcg daily just as needed.   Sent home with portable oxygen after hospitalization in June 2022 for pneumonia.   No abdominal pain, N/V. No unexplained weight loss or lack of appetite. Stable from a GI standpoint.    Past Medical History:  Diagnosis Date   Anxiety    Asthma    CHF (congestive heart failure) ([place])    Chronic abdominal pain    Chronic headaches    Complication of anesthesia    Crohn's disease ([place]) 03/2012   ? as of 06/05/2020: do not see endoscopic evidence or CT evidence of this. ?erroneous?    Diabetes mellitus without complication ([place])    Environmental allergies    Essential hypertension 03/11/2021   Family history of anesthesia complication    MH during C Section   H/O colonoscopy    History of electroencephalogram 03/2014   normal   IBS (irritable bowel syndrome) June 2014   Malignant hyperthemia    [name] /   Pt has been tested positive   Migraine    Obsessive-compulsive disorder    PTSD (post-traumatic stress disorder)    Seizures ([place])     Past Surgical History:  Procedure Laterality Date   BLADDER SURGERY     age 71   CESAREAN SECTION     X2   COLONOSCOPY  05/22/2012   SLF: Polyps, multiple hyperplastic in the sigmoid colon/Polyp in the rectum/  MODERATE Diverticulosis throughout the colon/ Internal hemorrhoids   ESOPHAGOGASTRODUODENOSCOPY     remote, had ulcers, [place], Dr. Truddie Coco   ESOPHAGOGASTRODUODENOSCOPY  05/22/2012   SLF: Stricture in the distal esophagus/Moderate gastritis   PARTIAL HYSTERECTOMY     TUBAL LIGATION      Current Outpatient  Medications  Medication Sig Dispense Refill   albuterol (PROVENTIL) (5 MG/ML) 0.5% nebulizer solution Take 2.5 mg by nebulization every 6 (six) hours as needed for wheezing or shortness of breath.     albuterol (VENTOLIN HFA) 108 (90 Base) MCG/ACT inhaler Inhale 2 puffs into the lungs every 6 (six) hours as needed for wheezing or shortness of breath. 18 g 2   carvedilol (COREG) 6.25 MG tablet TAKE 1 TABLET BY MOUTH TWICE A DAY 60 tablet 8   EPINEPHrine 0.3 mg/0.3 mL IJ SOAJ injection Inject 0.3 mg into the muscle as needed.     guaifenesin (ROBITUSSIN) 100 MG/5ML syrup Take 30 mLs by mouth 3 (three) times daily as needed for cough.     HYDROcodone-homatropine (HYCODAN) 5-1.5 MG/5ML syrup Take 5 mLs by mouth every 6 (six) hours as needed for cough. 120 mL 0   hydrocortisone 2.5 % ointment APPLY 1 APPLICATION 4 TIMES A DAY 40 g 1   hydrOXYzine (VISTARIL) 25 MG capsule Take 25 mg by mouth daily as needed for itching.     linaclotide (LINZESS) 290 MCG CAPS capsule Take 290 mcg by mouth daily.     omeprazole (PRILOSEC) 20 MG capsule Take 20 mg by mouth at bedtime.     QUEtiapine (SEROQUEL) 400 MG tablet Take 2 tablets (800 mg total) by mouth at bedtime. 180 tablet  1   UNABLE TO FIND CPAP at bedtime     No current facility-administered medications for this visit.    Allergies as of 08/05/2021 - Review Complete 08/05/2021  Allergen Reaction Noted   Aspirin Anaphylaxis and Swelling 11/17/2011   Lithium bromide [lithium] Anaphylaxis 03/11/2021   Peanut oil Anaphylaxis 10/25/2016   Peanut-containing drug products Anaphylaxis 11/17/2011   Penicillins Anaphylaxis 11/17/2011   Sulfa antibiotics Anaphylaxis 11/17/2011   Sulfamethoxazole Anaphylaxis and Itching 10/25/2016   Other  06/12/2019   Adhesive [tape] Rash 08/11/2014   Codeine Itching and Rash 11/17/2011   Latex Rash 01/12/2015   Rubbing alcohol [alcohol] Rash 11/17/2011    Family History  Problem Relation Age of Onset   Colon cancer  Maternal Grandfather        before the age of 69   Heart attack Father    Alcohol abuse Father    Diabetes Mother    Hypertension Mother    Dementia Mother    Depression Mother    Liver disease Brother        does not know details, but related to MVA?   Anxiety disorder Sister    Bipolar disorder Sister    OCD Sister    Anxiety disorder Sister    Depression Sister    Malignant hyperthermia Daughter    Heart murmur Son    Inflammatory bowel disease Neg Hx    ADD / ADHD Neg Hx    Drug abuse Neg Hx    Paranoid behavior Neg Hx    Schizophrenia Neg Hx    Seizures Neg Hx    Sexual abuse Neg Hx    Physical abuse Neg Hx    Colon polyps Neg Hx     Social History   Socioeconomic History   Marital status: Married    Spouse name: Not on file   Number of children: 2   Years of education: Not on file   Highest education level: Not on file  Occupational History   Occupation: plant, manual labor, lifting   Occupation: Disabled   Tobacco Use   Smoking status: Former    Types: Cigarettes    Quit date: 01/02/2011    Years since quitting: 10.5   Smokeless tobacco: Never   Tobacco comments:    Quit a few years ago  Vaping Use   Vaping Use: Never used  Substance and Sexual Activity   Alcohol use: No   Drug use: No   Sexual activity: Yes    Birth control/protection: Surgical  Other Topics Concern   Not on file  Social History Narrative   Patient is married with 2 children.   Patient is disabled.   Patient has 12+ years or schooling    Patient states that she can write with both hands.          Social Determinants of Health   Financial Resource Strain: Not on file  Food Insecurity: Not on file  Transportation Needs: Not on file  Physical Activity: Not on file  Stress: Not on file  Social Connections: Not on file    Review of Systems: Gen: Denies fever, chills, anorexia. Denies fatigue, weakness, weight loss.  CV: Denies chest pain, palpitations, syncope, peripheral  edema, and claudication. Resp: Denies dyspnea at rest, cough, wheezing, coughing up blood, and pleurisy. GI: see HPI Derm: Denies rash, itching, dry skin Psych: Denies depression, anxiety, memory loss, confusion. No homicidal or suicidal ideation.  Heme: Denies bruising, bleeding, and enlarged lymph nodes.  Physical  Exam: BP 133/77   Pulse 70   Temp (!) 97.2 F (36.2 C) (Temporal)   Ht 5' 2"  (1.575 m)   Wt 221 lb 6.4 oz (100.4 kg)   BMI 40.49 kg/m  General:   Alert and oriented. No distress noted. Pleasant and cooperative.  Head:  Normocephalic and atraumatic. Eyes:  Conjuctiva clear without scleral icterus. Mouth:  mask in place Abdomen:  +BS, soft, non-tender and non-distended. No rebound or guarding. No HSM or masses noted. Msk:  Symmetrical without gross deformities. Normal posture. Extremities:  Without edema. Neurologic:  Alert and  oriented x4 Psych:  Alert and cooperative. Normal mood and affect.  ASSESSMENT/PLAN Kathryn Olson is a 60 y.o. female presenting today with a history of IBS-C and GERD for routine follow-up.  She notes good control of GERD symptoms on omeprazole daily without dysphagia.  Constipation is managed with novel dosing of Linzess 290 mcg daily just as needed.  No concerning lower or upper GI signs/symptoms. As she is doing well, we will see her back in Aug 2023 and arrange colonoscopy at that time.  She is to call with any concerns in the meantime.  Annitta Needs, PhD, ANP-BC [facility]

## 2021-08-19 ENCOUNTER — Encounter: Payer: Self-pay | Admitting: Physician Assistant

## 2021-08-19 ENCOUNTER — Other Ambulatory Visit: Payer: Self-pay

## 2021-08-19 ENCOUNTER — Other Ambulatory Visit: Payer: Self-pay | Admitting: Family Medicine

## 2021-08-19 ENCOUNTER — Ambulatory Visit (INDEPENDENT_AMBULATORY_CARE_PROVIDER_SITE_OTHER): Payer: Medicare HMO | Admitting: Physician Assistant

## 2021-08-19 DIAGNOSIS — F319 Bipolar disorder, unspecified: Secondary | ICD-10-CM

## 2021-08-19 DIAGNOSIS — F4310 Post-traumatic stress disorder, unspecified: Secondary | ICD-10-CM | POA: Diagnosis not present

## 2021-08-19 DIAGNOSIS — G4733 Obstructive sleep apnea (adult) (pediatric): Secondary | ICD-10-CM

## 2021-08-19 DIAGNOSIS — F411 Generalized anxiety disorder: Secondary | ICD-10-CM | POA: Diagnosis not present

## 2021-08-19 DIAGNOSIS — Z1231 Encounter for screening mammogram for malignant neoplasm of breast: Secondary | ICD-10-CM

## 2021-08-19 MED ORDER — QUETIAPINE FUMARATE 400 MG PO TABS: 800.0000 mg | ORAL_TABLET | Freq: Every day | ORAL | 1 refills | Status: DC

## 2021-08-19 NOTE — Progress Notes (Signed)
[facility] Check  Patient ID: Kathryn Olson,  MRN: [id number]  PCP: Kathryn Fear, DO  Date of Evaluation: 08/19/2021 Time spent:30 minutes  Chief Complaint:  Chief Complaint   Depression; Anxiety; Insomnia      HISTORY/CURRENT STATUS: Overdue for routine follow-up  She has had a rough year with personal illness but is better now.  She does not know why she stopped the Zoloft, Xanax, and amitriptyline.  She cannot really tell me if these were discontinued when she was hospitalized for the summer or she stopped them sooner.  States she feels well mentally.  She is only taking the Seroquel now.  She is able to enjoy things.  Energy and motivation are still low but mostly because of her physical symptoms.  Having pneumonia has been hard on her body but she feels like she is bouncing back now and had a pretty good past month.  She does not cry easily.  Appetite and weight are stable.  She sleeps well.  She does get anxious at times triggered by things with family but overall she is doing well not on the Xanax.  No suicidal or homicidal thoughts.  Denies dizziness, syncope, seizures, numbness, tingling, tremor, tics, unsteady gait, slurred speech, confusion. Denies muscle or joint pain, stiffness, or dystonia.   Denies unexplained weight loss, frequent infections, or sores that heal slowly.  No polyphagia, polydipsia, or polyuria. Denies visual changes or paresthesias.    Individual Medical History/ Review of Systems: Changes? :Yes    Had covid last winter, had cough for months. She stayed sick, was treated for sinus infection several times, but kept getting worse and worse. She passed out on the porch at home and quit breathing. Was sent to [facility] and was hospitalized 04/06/2021 for acute respiratory failure, 'double pneumonia' was in for around 10 days. Was sent home on O2 and put on insulin.  Past medications for mental health diagnoses include: Equetro, Seroquel, VPA, Xanax,  Buspar, Zoloft, Elavil  Allergies: Aspirin, Lithium bromide [lithium], Peanut oil, Peanut-containing drug products, Penicillins, Sulfa antibiotics, Sulfamethoxazole, Other, Adhesive [tape], Codeine, Latex, and Rubbing alcohol [alcohol]  Current Medications:  Current Outpatient Medications:    albuterol (PROVENTIL) (5 MG/ML) 0.5% nebulizer solution, Take 2.5 mg by nebulization every 6 (six) hours as needed for wheezing or shortness of breath., Disp: , Rfl:    albuterol (VENTOLIN HFA) 108 (90 Base) MCG/ACT inhaler, Inhale 2 puffs into the lungs every 6 (six) hours as needed for wheezing or shortness of breath., Disp: 18 g, Rfl: 2   carvedilol (COREG) 6.25 MG tablet, TAKE 1 TABLET BY MOUTH TWICE A DAY, Disp: 60 tablet, Rfl: 8   cetirizine (ZYRTEC) 10 MG tablet, Take 1 tablet by mouth daily., Disp: , Rfl:    EPINEPHrine 0.3 mg/0.3 mL IJ SOAJ injection, Inject 0.3 mg into the muscle as needed., Disp: , Rfl:    guaifenesin (ROBITUSSIN) 100 MG/5ML syrup, Take 30 mLs by mouth 3 (three) times daily as needed for cough., Disp: , Rfl:    HYDROcodone-homatropine (HYCODAN) 5-1.5 MG/5ML syrup, Take 5 mLs by mouth every 6 (six) hours as needed for cough., Disp: 120 mL, Rfl: 0   hydrocortisone 2.5 % ointment, APPLY 1 APPLICATION 4 TIMES A DAY, Disp: 40 g, Rfl: 1   hydrOXYzine (VISTARIL) 25 MG capsule, Take 25 mg by mouth daily as needed for itching., Disp: , Rfl:    linaclotide (LINZESS) 290 MCG CAPS capsule, Take 290 mcg by mouth daily., Disp: , Rfl:  metFORMIN (GLUCOPHAGE) 500 MG tablet, metformin 500 mg tablet  Take 1.5 tablets twice a day by oral route for 30 days., Disp: , Rfl:    NOVOLIN N RELION 100 UNIT/ML injection, SMARTSIG:7 Unit(s) SUB-Q Twice Daily, Disp: , Rfl:    UNABLE TO FIND, CPAP at bedtime, Disp: , Rfl:    omeprazole (PRILOSEC) 20 MG capsule, Take 20 mg by mouth at bedtime. (Patient not taking: Reported on 08/19/2021), Disp: , Rfl:    QUEtiapine (SEROQUEL) 400 MG tablet, Take 2 tablets (800  mg total) by mouth at bedtime., Disp: 180 tablet, Rfl: 1 Medication Side Effects: none  Family Medical/ Social History: Changes? Daughter broke her leg not long ago.   MENTAL HEALTH EXAM:  There were no vitals taken for this visit.There is no height or weight on file to calculate BMI.  General Appearance: Casual, Neat, Well Groomed and Obese  Eye Contact:  Good  Speech:  Clear and Coherent and Normal Rate  Volume:  Normal  Mood:  Euthymic  Affect:  Appropriate  Thought Process:  Goal Directed and Descriptions of Associations: Intact  Orientation:  Full (Time, Place, and Person)  Thought Content: Logical   Suicidal Thoughts:  No  Homicidal Thoughts:  No  Memory:  WNL  Judgement:  Good  Insight:  Good  Psychomotor Activity:  Normal  Concentration:  Concentration: Good and Attention Span: Good  Recall:  Good  Fund of Knowledge: Good  Language: Good  Assets:  Desire for Improvement  ADL's:  Intact  Cognition: WNL  Prognosis:  Good   Labs from 04/12/2021 reviewed Discharge summary from 04/06/2021 admission reviewed and Xanax, amitriptyline, and Zoloft were discontinued.  Depakote and Tegretol were listed as to be discontinued but she was not on those medications at that time. Her PCP treats the diabetes  DIAGNOSES:    ICD-10-CM   1. Bipolar I disorder ([place])  F31.9     2. PTSD (post-traumatic stress disorder)  F43.10     3. Generalized anxiety disorder  F41.1     4. Obstructive sleep apnea  G47.33        Receiving Psychotherapy: No    RECOMMENDATIONS:  PDMP reviewed.  Controlled substance since fourth 2022, Hycodan cough syrup. I provided 30 minutes of face to face time during this encounter, including time spent before and after the visit in records review, medical decision making, and charting.  She doesn't know why the Xanax, Zoloft, and Elavil were stopped. But she's doing fine on the Seroquel only so we will not restart the others. D/Ced Zoloft, Xanax, and  amitriptyline. Continue CPAP use nightly. Continue Seroquel 400 mg, 2 p.o. nightly. Return in 6 months.  Donnal Moat, PA-C

## 2021-08-27 ENCOUNTER — Other Ambulatory Visit: Payer: Self-pay

## 2021-08-27 ENCOUNTER — Encounter: Payer: Self-pay | Admitting: Cardiology

## 2021-08-27 ENCOUNTER — Ambulatory Visit (INDEPENDENT_AMBULATORY_CARE_PROVIDER_SITE_OTHER): Payer: Medicare HMO | Admitting: Cardiology

## 2021-08-27 VITALS — BP 128/70 | HR 80 | Resp 18 | Ht 62.0 in | Wt 222.0 lb

## 2021-08-27 DIAGNOSIS — R002 Palpitations: Secondary | ICD-10-CM | POA: Diagnosis not present

## 2021-08-27 NOTE — Progress Notes (Signed)
Electrophysiology Office Note   Date:  08/27/2021   ID:  Kathryn Olson, DOB April 25, 1961, MRN [id number]  PCP:  Emelda Fear, DO  Primary Electrophysiologist:  Constance Haw, MD    No chief complaint on file.    History of Present Illness: Kathryn Olson is a 60 y.o. female who presents today for electrophysiology evaluation.     She has a history significant for CHF with normalized ejection fraction, Crohn's disease, diabetes, hypertension, palpitations.  Her palpitations improved with metoprolol.  She was previously admitted to [facility] after anaphylactic reaction.  She had an echocardiogram and was told that she had congestive heart failure.  Prior to that she was having palpitations.  She wore a 30-day monitor without evidence of arrhythmia.  Today, denies symptoms of palpitations, chest pain, shortness of breath, orthopnea, PND, lower extremity edema, claudication, dizziness, presyncope, syncope, bleeding, or neurologic sequela. The patient is tolerating medications without difficulties.  Since being seen, she unfortunately had a prolonged hospitalization at [facility] for COVID and pneumonia.  Since that hospitalization she has done well.  She is not have any current cardiac complaints.  She is able to all of her daily activities without restriction  Past Medical History:  Diagnosis Date   Anxiety    Asthma    CHF (congestive heart failure) ([place])    Chronic abdominal pain    Chronic headaches    Complication of anesthesia    Crohn's disease ([place]) 03/2012   ? as of 06/05/2020: do not see endoscopic evidence or CT evidence of this. ?erroneous?    Diabetes mellitus without complication ([place])    Environmental allergies    Essential hypertension 03/11/2021   Family history of anesthesia complication    MH during C Section   H/O colonoscopy    History of electroencephalogram 03/2014   normal   IBS (irritable bowel syndrome) June 2014   Malignant hyperthemia    Pt's  daughter /   Pt has been tested positive   Migraine    Obsessive-compulsive disorder    PTSD (post-traumatic stress disorder)    Seizures ([place])    Past Surgical History:  Procedure Laterality Date   BLADDER SURGERY     age 25   CESAREAN SECTION     X2   COLONOSCOPY  05/22/2012   SLF: Polyps, multiple hyperplastic in the sigmoid colon/Polyp in the rectum/  MODERATE Diverticulosis throughout the colon/ Internal hemorrhoids   ESOPHAGOGASTRODUODENOSCOPY     remote, had ulcers, [place], Dr. Truddie Coco   ESOPHAGOGASTRODUODENOSCOPY  05/22/2012   SLF: Stricture in the distal esophagus/Moderate gastritis   PARTIAL HYSTERECTOMY     TUBAL LIGATION       Current Outpatient Medications  Medication Sig Dispense Refill   albuterol (PROVENTIL) (5 MG/ML) 0.5% nebulizer solution Take 2.5 mg by nebulization every 6 (six) hours as needed for wheezing or shortness of breath.     albuterol (VENTOLIN HFA) 108 (90 Base) MCG/ACT inhaler Inhale 2 puffs into the lungs every 6 (six) hours as needed for wheezing or shortness of breath. 18 g 2   carvedilol (COREG) 6.25 MG tablet TAKE 1 TABLET BY MOUTH TWICE A DAY 60 tablet 8   EPINEPHrine 0.3 mg/0.3 mL IJ SOAJ injection Inject 0.3 mg into the muscle as needed.     guaifenesin (ROBITUSSIN) 100 MG/5ML syrup Take 30 mLs by mouth 3 (three) times daily as needed for cough.     HYDROcodone-homatropine (HYCODAN) 5-1.5 MG/5ML syrup Take 5 mLs by mouth every  6 (six) hours as needed for cough. 120 mL 0   hydrocortisone 2.5 % ointment APPLY 1 APPLICATION 4 TIMES A DAY 40 g 1   hydrOXYzine (VISTARIL) 25 MG capsule Take 25 mg by mouth daily as needed for itching.     levocetirizine (XYZAL) 5 MG tablet Take 5 mg by mouth every evening.     linaclotide (LINZESS) 290 MCG CAPS capsule Take 290 mcg by mouth daily.     metFORMIN (GLUCOPHAGE) 500 MG tablet metformin 500 mg tablet  Take 1.5 tablets twice a day by oral route for 30 days.     NOVOLIN N RELION 100 UNIT/ML injection  SMARTSIG:7 Unit(s) SUB-Q Twice Daily     omeprazole (PRILOSEC) 20 MG capsule Take 20 mg by mouth at bedtime.     UNABLE TO FIND CPAP at bedtime     No current facility-administered medications for this visit.    Allergies:   Aspirin, Lithium bromide [lithium], Peanut oil, Peanut-containing drug products, Penicillins, Sulfa antibiotics, Sulfamethoxazole, Other, Adhesive [tape], Codeine, Latex, and Rubbing alcohol [alcohol]   Social History:  The patient  reports that she quit smoking about 10 years ago. Her smoking use included cigarettes. She has never used smokeless tobacco. She reports that she does not drink alcohol and does not use drugs.   Family History:  The patient's family history includes Alcohol abuse in her father; Anxiety disorder in her sister and sister; Bipolar disorder in her sister; Colon cancer in her maternal grandfather; Dementia in her mother; Depression in her mother and sister; Diabetes in her mother; Heart attack in her father; Heart murmur in her son; Hypertension in her mother; Liver disease in her brother; Malignant hyperthermia in her daughter; OCD in her sister.   ROS:  Please see the history of present illness.   Otherwise, review of systems is positive for none.   All other systems are reviewed and negative.   PHYSICAL EXAM: VS:  BP 128/70   Pulse 80   Resp 18   Ht 5' 2"  (1.575 m)   Wt 222 lb (100.7 kg)   SpO2 98%   BMI 40.60 kg/m  , BMI Body mass index is 40.6 kg/m. GEN: Well nourished, well developed, in no acute distress  HEENT: normal  Neck: no JVD, carotid bruits, or masses Cardiac: RRR; no murmurs, rubs, or gallops,no edema  Respiratory:  clear to auscultation bilaterally, normal work of breathing GI: soft, nontender, nondistended, + BS MS: no deformity or atrophy  Skin: warm and dry Neuro:  Strength and sensation are intact Psych: euthymic mood, full affect  EKG:  EKG is ordered today. Personal review of the ekg ordered shows sinus rhythm,  rate 80  Recent Labs: 01/04/2021: B Natriuretic Peptide 66.6 01/19/2021: ALT 12; BUN 10; Creatinine, Ser 0.77; Potassium 4.1; Sodium 139 03/11/2021: Hemoglobin 13.6; Platelets 212.0    Lipid Panel     Component Value Date/Time   CHOL 203 (H) 09/04/2013 1728   TRIG 185 (H) 09/04/2013 1728   HDL 59 09/04/2013 1728   LDLCALC 107 (H) 09/04/2013 1728     Wt Readings from Last 3 Encounters:  08/27/21 222 lb (100.7 kg)  08/05/21 221 lb 6.4 oz (100.4 kg)  03/11/21 204 lb (92.5 kg)      Other studies Reviewed: Additional studies/ records that were reviewed today include: Myoview 01/05/17 Nuclear stress EF: 55%. There was no ST segment deviation noted during stress. The study is normal. The left ventricular ejection fraction is normal (55-65%).  1. EF 55%, normal wall motion.  2. No significant perfusion defect, no evidence for ischemia or infarction.  3. Normal study.    ASSESSMENT AND PLAN:  1.  Hypertension: Currently well controlled on carvedilol.  2.  Palpitations: Improved with beta-blockers.  She has had no further episodes since her hospitalization.  She continues to do well without complaint.  3.  History of CHF: Ejection fraction has normalized.  No changes.  Current medicines are reviewed at length with the patient today.   The patient does not have concerns regarding her medicines.  The following changes were made today: None  Labs/ tests ordered today include:  Orders Placed This Encounter  Procedures   EKG 12-Lead      Disposition:   FU with Anniah Glick 12 month  Signed, Tayvin Preslar Meredith Leeds, MD  08/27/2021 12:05 PM     [facility] [address] [place] [address] [phone] (office) [phone] (fax)

## 2021-11-24 IMAGING — DX DG CHEST 2V
2 series · 2 of 2 positions shown · non-contrast
Comparison: 05/17/2017

CLINICAL DATA: Cough, congestion, shortness of breath

EXAM:
CHEST - 2 VIEW

[chest lat]
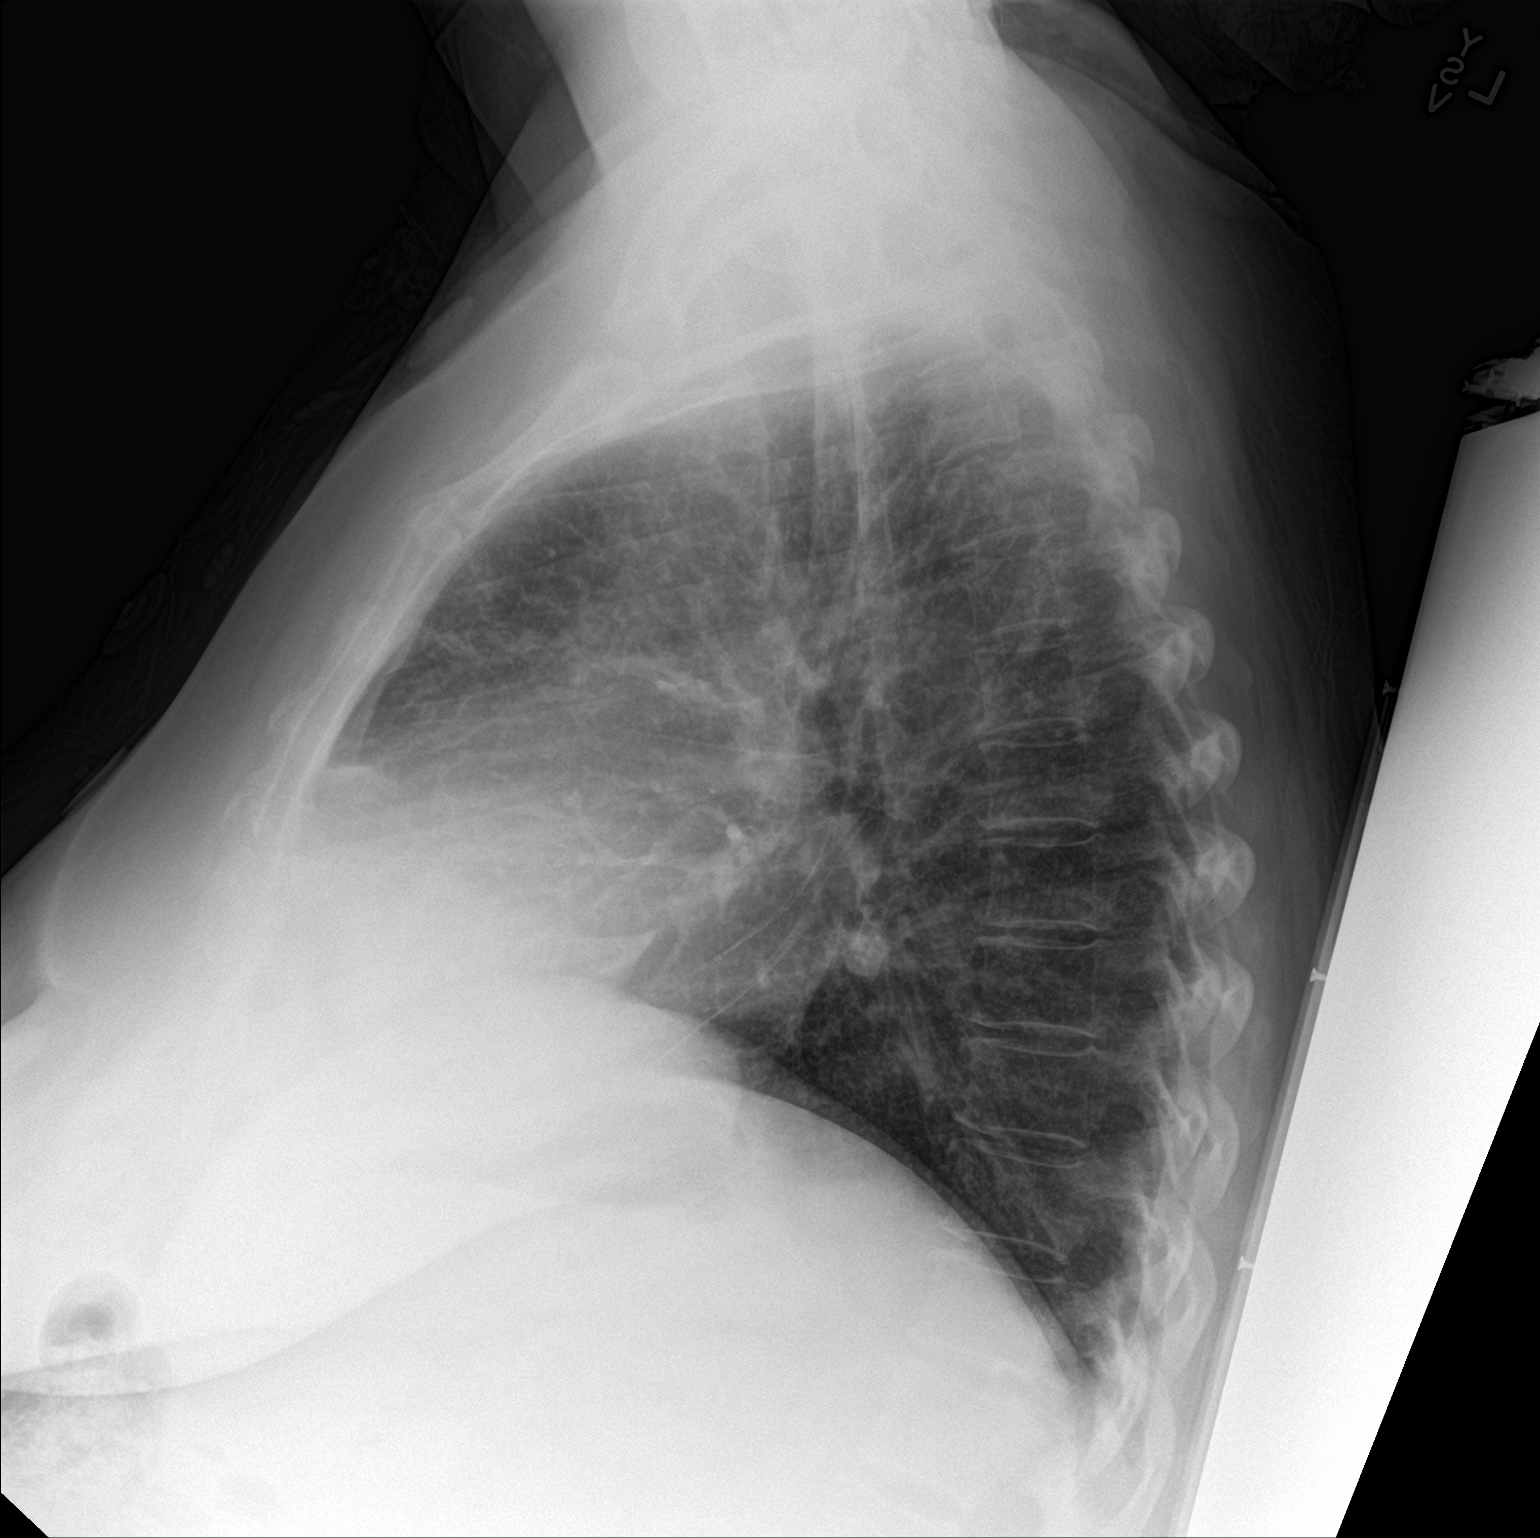

[chest ap]
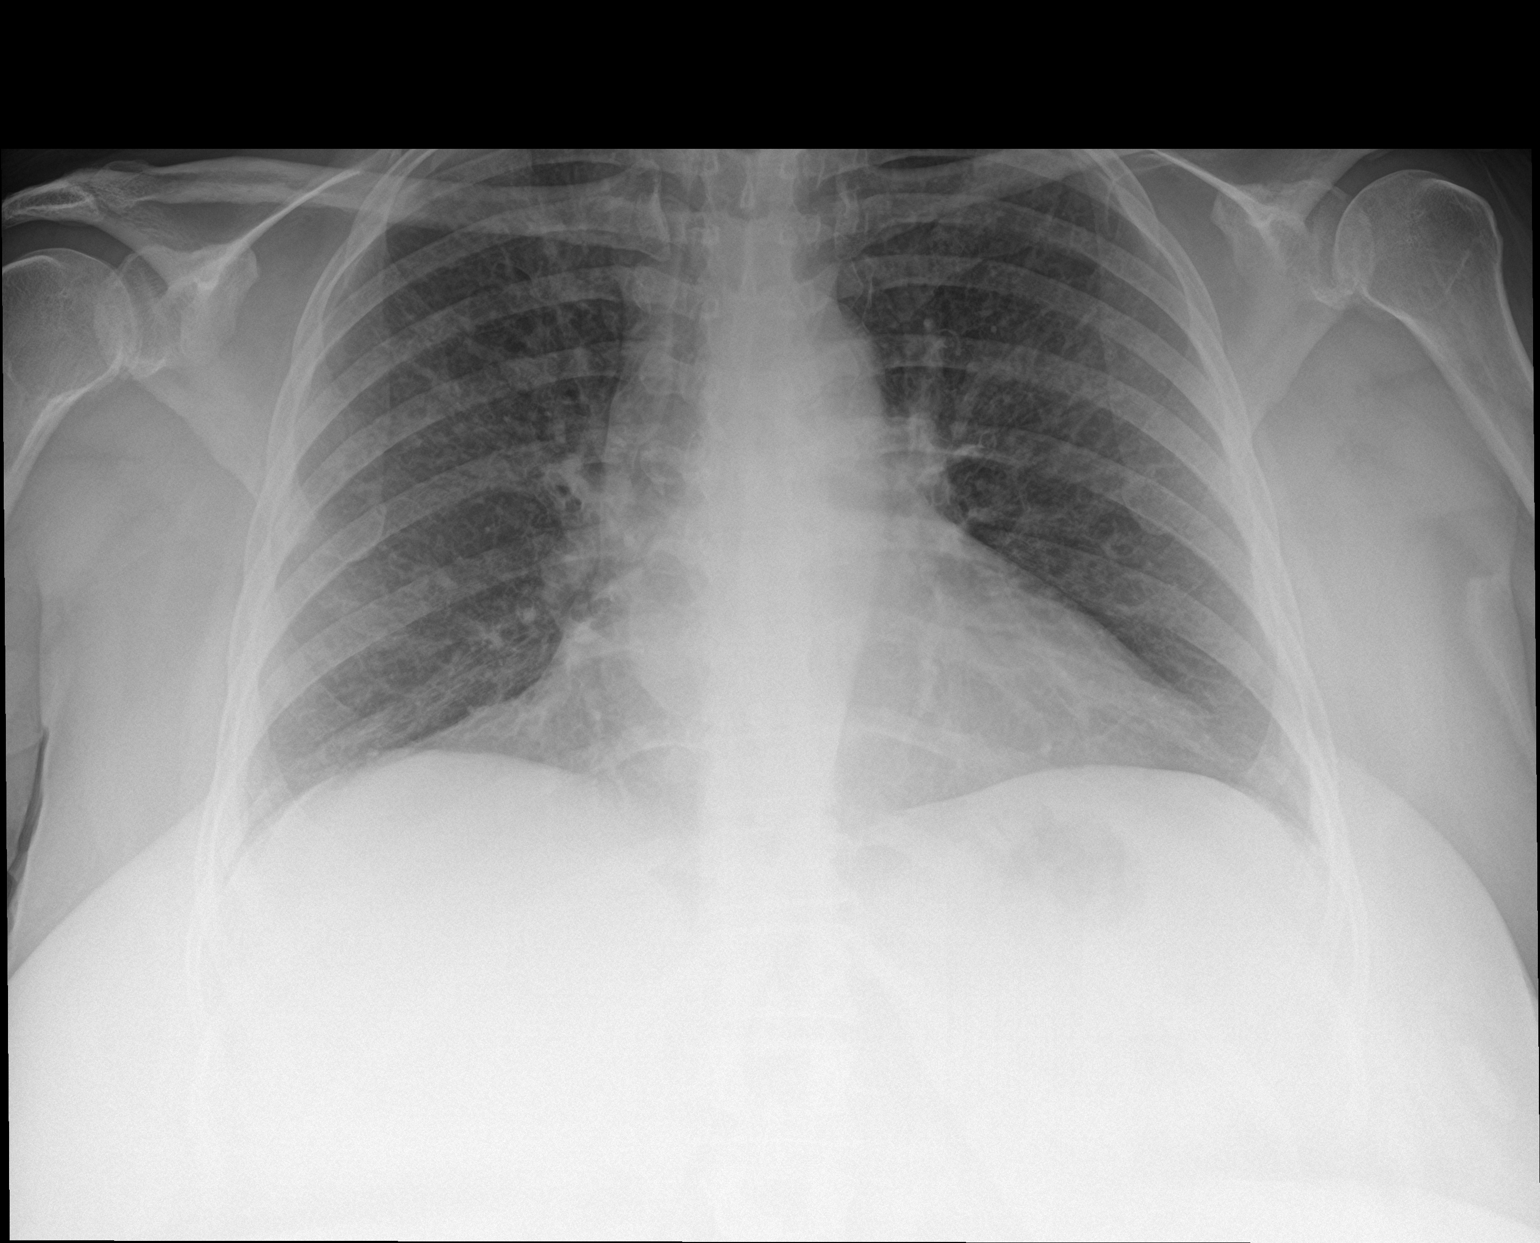

[2 of 2 positions shown; findings below may reference images not displayed]

FINDINGS: Interstitial prominence in the upper lobes, similar to prior study.
No acute confluent opacities or effusions. Heart is normal size. No
acute bony abnormality.
IMPRESSION: No active cardiopulmonary disease.

## 2022-02-26 ENCOUNTER — Ambulatory Visit: Payer: Medicare HMO | Admitting: Physician Assistant

## 2022-02-26 ENCOUNTER — Ambulatory Visit
Admission: RE | Admit: 2022-02-26 | Discharge: 2022-02-26 | Disposition: A | Discharge status: Home / Self Care | Payer: Medicare HMO | Source: Ambulatory Visit | Attending: Family Medicine | Admitting: Family Medicine

## 2022-02-26 DIAGNOSIS — Z1231 Encounter for screening mammogram for malignant neoplasm of breast: Secondary | ICD-10-CM

## 2022-03-04 ENCOUNTER — Ambulatory Visit: Payer: Medicare HMO | Admitting: Physician Assistant

## 2022-03-05 ENCOUNTER — Other Ambulatory Visit: Payer: Self-pay | Admitting: Family Medicine

## 2022-03-05 DIAGNOSIS — Z1231 Encounter for screening mammogram for malignant neoplasm of breast: Secondary | ICD-10-CM

## 2022-03-24 ENCOUNTER — Ambulatory Visit (INDEPENDENT_AMBULATORY_CARE_PROVIDER_SITE_OTHER): Payer: Medicare HMO | Admitting: Physician Assistant

## 2022-03-24 ENCOUNTER — Encounter: Payer: Self-pay | Admitting: Physician Assistant

## 2022-03-24 DIAGNOSIS — F411 Generalized anxiety disorder: Secondary | ICD-10-CM | POA: Diagnosis not present

## 2022-03-24 DIAGNOSIS — F319 Bipolar disorder, unspecified: Secondary | ICD-10-CM

## 2022-03-24 MED ORDER — QUETIAPINE FUMARATE 200 MG PO TABS: ORAL_TABLET | ORAL | 1 refills | Status: DC

## 2022-03-24 NOTE — Progress Notes (Signed)
[facility]  Patient ID: Kathryn Olson,  MRN: [id number]  PCP: Emelda Fear, DO  Date of Evaluation: 03/24/2022 Time spent:25 minutes  Chief Complaint:  Chief Complaint   Follow-up    Virtual Visit via Telehealth  I connected with patient by telephone, with their informed consent, and verified patient privacy and that I am speaking with the correct person using two identifiers.  I am private, in my office and the patient is at home.  I discussed the limitations, risks, security and privacy concerns of performing an evaluation and management service by telephone and the availability of in person appointments. I also discussed with the patient that there may be a patient responsible charge related to this service. The patient expressed understanding and agreed to proceed.   I discussed the assessment and treatment plan with the patient. The patient was provided an opportunity to ask questions and all were answered. The patient agreed with the plan and demonstrated an understanding of the instructions.   The patient was advised to call back or seek an in-person evaluation if the symptoms worsen or if the condition fails to improve as anticipated.  I provided 25 minutes of non-face-to-face time during this encounter.  HISTORY/CURRENT STATUS: For follow-up  Off psych meds, unsure how long. Can't afford them.  Feels depressed at times though, thinks about her Mom a lot, she died a few years ago.  There is still a lot of conflict within her family due to their loss.  Kathryn Olson is unable to work because of physical and mental health reasons.  Lately she has had a hard time enjoying things.  Energy and motivation are low.  She does follow up with her primary provider as recommended.  They follow labs for diabetes.  ADLs and personal hygiene are normal.  Appetite is normal and weight is stable.  Sleeps fairly good most of the time.  She does have anxiety that she usually triggered by  something.  Not having panic attacks but has an overwhelming sense of dread sometimes.  Not isolating.  Does not cry easily.  No suicidal or homicidal thoughts.  Patient denies increased energy with decreased need for sleep, no increased talkativeness, no racing thoughts, no impulsivity or risky behaviors, no increased spending, no increased libido, no grandiosity, no increased irritability or anger, no paranoia, and no hallucinations.  Denies dizziness, syncope, seizures, numbness, tingling, tremor, tics, unsteady gait, slurred speech, confusion. Denies muscle or joint pain, stiffness, or dystonia.   Denies unexplained weight loss, frequent infections, or sores that heal slowly.  No polyphagia, polydipsia, or polyuria. Denies visual changes or paresthesias.    Individual Medical History/ Review of Systems: Changes? :No     Past medications for mental health diagnoses include: Equetro, Seroquel, VPA, Xanax, Buspar, Zoloft, Elavil  Allergies: Aspirin, Lithium bromide [lithium], Peanut oil, Peanut-containing drug products, Penicillins, Sulfa antibiotics, Sulfamethoxazole, Other, Adhesive [tape], Codeine, Latex, and Rubbing alcohol [alcohol]  Current Medications:  Current Outpatient Medications:    albuterol (PROVENTIL) (5 MG/ML) 0.5% nebulizer solution, Take 2.5 mg by nebulization every 6 (six) hours as needed for wheezing or shortness of breath., Disp: , Rfl:    albuterol (VENTOLIN HFA) 108 (90 Base) MCG/ACT inhaler, Inhale 2 puffs into the lungs every 6 (six) hours as needed for wheezing or shortness of breath., Disp: 18 g, Rfl: 2   carvedilol (COREG) 6.25 MG tablet, TAKE 1 TABLET BY MOUTH TWICE A DAY, Disp: 60 tablet, Rfl: 8   EPINEPHrine 0.3 mg/0.3 mL  IJ SOAJ injection, Inject 0.3 mg into the muscle as needed., Disp: , Rfl:    hydrocortisone 2.5 % ointment, APPLY 1 APPLICATION 4 TIMES A DAY, Disp: 40 g, Rfl: 1   hydrOXYzine (VISTARIL) 25 MG capsule, Take 25 mg by mouth daily as needed for  itching., Disp: , Rfl:    levocetirizine (XYZAL) 5 MG tablet, Take 5 mg by mouth every evening., Disp: , Rfl:    linaclotide (LINZESS) 290 MCG CAPS capsule, Take 290 mcg by mouth daily., Disp: , Rfl:    metFORMIN (GLUCOPHAGE) 500 MG tablet, metformin 500 mg tablet  Take 1.5 tablets twice a day by oral route for 30 days., Disp: , Rfl:    NOVOLIN N RELION 100 UNIT/ML injection, SMARTSIG:7 Unit(s) SUB-Q Twice Daily, Disp: , Rfl:    QUEtiapine (SEROQUEL) 200 MG tablet, 1/2 pill qhs for 2 weeks, then increase to 1 po qhs., Disp: 30 tablet, Rfl: 1   guaifenesin (ROBITUSSIN) 100 MG/5ML syrup, Take 30 mLs by mouth 3 (three) times daily as needed for cough. (Patient not taking: Reported on 03/24/2022), Disp: , Rfl:    HYDROcodone-homatropine (HYCODAN) 5-1.5 MG/5ML syrup, Take 5 mLs by mouth every 6 (six) hours as needed for cough. (Patient not taking: Reported on 03/24/2022), Disp: 120 mL, Rfl: 0   omeprazole (PRILOSEC) 20 MG capsule, Take 20 mg by mouth at bedtime. (Patient not taking: Reported on 03/24/2022), Disp: , Rfl:    UNABLE TO FIND, CPAP at bedtime (Patient not taking: Reported on 03/24/2022), Disp: , Rfl:  Medication Side Effects: none  Family Medical/ Social History: Changes? Daughter broke her leg not long ago.   MENTAL HEALTH EXAM:  There were no vitals taken for this visit.There is no height or weight on file to calculate BMI.  General Appearance:  Unable to assess  Eye Contact:   Unable to assess  Speech:  Clear and Coherent and Normal Rate  Volume:  Normal  Mood:  Euthymic  Affect:   Unable to assess  Thought Process:  Goal Directed and Descriptions of Associations: Intact  Orientation:  Full (Time, Place, and Person)  Thought Content: Logical   Suicidal Thoughts:  No  Homicidal Thoughts:  No  Memory:  WNL  Judgement:  Good  Insight:  Good  Psychomotor Activity:   Unable to assess  Concentration:  Concentration: Good and Attention Span: Good  Recall:  Good  Fund of Knowledge: Good   Language: Good  Assets:  Desire for Improvement  ADL's:  Intact  Cognition: WNL  Prognosis:  Good   DIAGNOSES:    ICD-10-CM   1. Bipolar I disorder ([place])  F31.9     2. Generalized anxiety disorder  F41.1      Receiving Psychotherapy: No    RECOMMENDATIONS:  PDMP reviewed.   I provided 25 minutes of non-face-to-face time during this encounter, including time spent before and after the visit in records review, medical decision making, counseling pertinent to today's visit, and charting.  She really needs to get back on Seroquel.  That will help her mood as well as sleep.  I will look on [facility] for the least expensive place and sent it there.  Continue hydroxyzine 25 mg, 1 p.o. daily as needed. Restart Seroquel 200 mg, one half p.o. nightly for 2 weeks then 1 p.o. nightly. Return in 6 weeks.  Donnal Moat, PA-C

## 2022-04-08 ENCOUNTER — Encounter: Payer: Self-pay | Admitting: Gastroenterology

## 2022-06-08 ENCOUNTER — Ambulatory Visit: Payer: Medicare HMO | Admitting: Gastroenterology

## 2022-06-08 NOTE — Progress Notes (Unsigned)
Gastroenterology Office Note     Primary Care Physician:  Emelda Fear, DO  Primary Gastroenterologist:   Chief Complaint   Chief Complaint  Patient presents with   Follow-up    Patient here today for a follow up on Gerd. She is omeprazole 20 mg QHS, this controls her gerd symptoms. She says she watches her diet to prevent any symptoms. Constipation is under control with Linzess 290 mcg once per day. She denies any other current issues.     History of Present Illness   Kathryn Olson is a 61 y.o. female presenting today in follow-up with a history of IBS-C and GERD. Due for routine screening colonoscopy in 2023.   GERD: well controlled on omeprazole just as needed. No dysphagia. No abdominal pain.  IBS-C: takes Linzess just as needed. Will have abdominal discomfort/gas when time for a bowel movement but then resolves.   No abdominal pain, N/V, changes in bowel habits, overt GI bleeding, unexplained weight loss, lack of appetite, unexplained weight gain.      Past Medical History:  Diagnosis Date   Anxiety    Asthma    CHF (congestive heart failure) ([place])    Chronic abdominal pain    Chronic headaches    Complication of anesthesia    Crohn's disease ([place]) 03/2012   ? as of 06/05/2020: do not see endoscopic evidence or CT evidence of this. ?erroneous?    Diabetes mellitus without complication ([place])    Environmental allergies    Essential hypertension 03/11/2021   Family history of anesthesia complication    MH during C Section   H/O colonoscopy    History of electroencephalogram 03/2014   normal   IBS (irritable bowel syndrome) June 2014   Malignant hyperthemia    [name] /   Pt has been tested positive   Migraine    Obsessive-compulsive disorder    PTSD (post-traumatic stress disorder)    Seizures ([place])     Past Surgical History:  Procedure Laterality Date   BLADDER SURGERY     age 19   CESAREAN SECTION     X2   COLONOSCOPY  05/22/2012    SLF: Polyps, multiple hyperplastic in the sigmoid colon/Polyp in the rectum/  MODERATE Diverticulosis throughout the colon/ Internal hemorrhoids   ESOPHAGOGASTRODUODENOSCOPY     remote, had ulcers, [place], Dr. Truddie Coco   ESOPHAGOGASTRODUODENOSCOPY  05/22/2012   SLF: Stricture in the distal esophagus/Moderate gastritis   PARTIAL HYSTERECTOMY     TUBAL LIGATION      Current Outpatient Medications  Medication Sig Dispense Refill   albuterol (PROVENTIL) (5 MG/ML) 0.5% nebulizer solution Take 2.5 mg by nebulization every 6 (six) hours as needed for wheezing or shortness of breath.     albuterol (VENTOLIN HFA) 108 (90 Base) MCG/ACT inhaler Inhale 2 puffs into the lungs every 6 (six) hours as needed for wheezing or shortness of breath. 18 g 2   carvedilol (COREG) 6.25 MG tablet TAKE 1 TABLET BY MOUTH TWICE A DAY 60 tablet 8   EPINEPHrine 0.3 mg/0.3 mL IJ SOAJ injection Inject 0.3 mg into the muscle as needed.     guaifenesin (ROBITUSSIN) 100 MG/5ML syrup Take 30 mLs by mouth 3 (three) times daily as needed for cough.     hydrocortisone 2.5 % ointment APPLY 1 APPLICATION 4 TIMES A DAY 40 g 1   hydrOXYzine (VISTARIL) 25 MG capsule Take 25 mg by mouth daily as needed for itching.  levocetirizine (XYZAL) 5 MG tablet Take 5 mg by mouth every evening.     linaclotide (LINZESS) 290 MCG CAPS capsule Take 290 mcg by mouth daily.     metFORMIN (GLUCOPHAGE) 500 MG tablet metformin 500 mg tablet  Take 1.5 tablets twice a day by oral route for 30 days.     NOVOLIN N RELION 100 UNIT/ML injection SMARTSIG:7 Unit(s) SUB-Q Twice Daily     omeprazole (PRILOSEC) 20 MG capsule Take 20 mg by mouth at bedtime.     QUEtiapine (SEROQUEL) 200 MG tablet 1/2 pill qhs for 2 weeks, then increase to 1 po qhs. (Patient taking differently: Take 200 mg by mouth at bedtime. 1/2 pill qhs for 2 weeks, then increase to 1 po qhs.) 30 tablet 1   No current facility-administered medications for this visit.    Allergies as of  06/09/2022 - Review Complete 06/09/2022  Allergen Reaction Noted   Aspirin Anaphylaxis and Swelling 11/17/2011   Lithium bromide [lithium] Anaphylaxis 03/11/2021   Peanut oil Anaphylaxis 10/25/2016   Peanut-containing drug products Anaphylaxis 11/17/2011   Penicillins Anaphylaxis 11/17/2011   Sulfa antibiotics Anaphylaxis 11/17/2011   Sulfamethoxazole Anaphylaxis and Itching 10/25/2016   Other  06/12/2019   Adhesive [tape] Rash 08/11/2014   Codeine Itching and Rash 11/17/2011   Latex Rash 01/12/2015   Rubbing alcohol [alcohol] Rash 11/17/2011    Family History  Problem Relation Age of Onset   Colon cancer Maternal Grandfather        before the age of 4   Heart attack Father    Alcohol abuse Father    Diabetes Mother    Hypertension Mother    Dementia Mother    Depression Mother    Liver disease Brother        does not know details, but related to MVA?   Anxiety disorder Sister    Bipolar disorder Sister    OCD Sister    Anxiety disorder Sister    Depression Sister    Malignant hyperthermia Daughter    Heart murmur Son    Inflammatory bowel disease Neg Hx    ADD / ADHD Neg Hx    Drug abuse Neg Hx    Paranoid behavior Neg Hx    Schizophrenia Neg Hx    Seizures Neg Hx    Sexual abuse Neg Hx    Physical abuse Neg Hx    Colon polyps Neg Hx     Social History   Socioeconomic History   Marital status: Married    Spouse name: Not on file   Number of children: 2   Years of education: Not on file   Highest education level: Not on file  Occupational History   Occupation: plant, manual labor, lifting   Occupation: Disabled   Tobacco Use   Smoking status: Former    Types: Cigarettes    Quit date: 01/02/2011    Years since quitting: 11.4   Smokeless tobacco: Never   Tobacco comments:    Quit a few years ago  Vaping Use   Vaping Use: Never used  Substance and Sexual Activity   Alcohol use: No   Drug use: No   Sexual activity: Yes    Birth control/protection:  Surgical  Other Topics Concern   Not on file  Social History Narrative   Patient is married with 2 children.   Patient is disabled.   Patient has 12+ years or schooling    Patient states that she can write with both hands.  Social Determinants of Health   Financial Resource Strain: Not on file  Food Insecurity: Not on file  Transportation Needs: Not on file  Physical Activity: Not on file  Stress: Not on file  Social Connections: Not on file  Intimate Partner Violence: Not on file     Review of Systems   Gen: Denies any fever, chills, fatigue, weight loss, lack of appetite.  CV: Denies chest pain, heart palpitations, peripheral edema, syncope.  Resp: Denies shortness of breath at rest or with exertion. Denies wheezing or cough.  GI: see HPI GU : Denies urinary burning, urinary frequency, urinary hesitancy MS: Denies joint pain, muscle weakness, cramps, or limitation of movement.  Derm: Denies rash, itching, dry skin Psych: Denies depression, anxiety, memory loss, and confusion Heme: Denies bruising, bleeding, and enlarged lymph nodes.   Physical Exam   BP 132/73 (BP Location: Left Arm, Patient Position: Sitting, Cuff Size: Large)   Pulse 78   Temp 98.1 F (36.7 C) (Oral)   Ht 5' 2"  (1.575 m)   Wt 232 lb 3.2 oz (105.3 kg)   BMI 42.47 kg/m  General:   Alert and oriented. Pleasant and cooperative. Well-nourished and well-developed.  Head:  Normocephalic and atraumatic. Eyes:  Without icterus Abdomen:  +BS, soft, non-tender and non-distended. No HSM noted. No guarding or rebound. No masses appreciated.  Rectal:  Deferred  Msk:  Symmetrical without gross deformities. Normal posture. Extremities:  Without edema. Neurologic:  Alert and  oriented x4;  grossly normal neurologically. Skin:  Intact without significant lesions or rashes. Psych:  Alert and cooperative. Normal mood and affect.   Assessment   Kathryn Olson is a 61 y.o. female presenting today in  follow-up with a history of IBS-C and GERD. Due for routine screening colonoscopy in 2023.  GERD: taking omeprazole just as needed and doing well.  IBS-C: novel approach of Linzess just as needed, which is rare.   She has no concerning upper or lower GI signs/symptoms. Due for routine screening colonoscopy now. No FH colon polyps. No first-degree relatives with history of colon cancer but reports maternal grandfather with history of colon cancer.      PLAN    Proceed with colonoscopy by Dr. Abbey Chatters  in near future: the risks, benefits, and alternatives have been discussed with the patient in detail. The patient states understanding and desires to proceed. ASA 3 due to BMI Will see her back in 1 year as she is doing well    Annitta Needs, PhD, ANP-BC [facility] Gastroenterology

## 2022-06-09 ENCOUNTER — Encounter: Payer: Self-pay | Admitting: *Deleted

## 2022-06-09 ENCOUNTER — Encounter: Payer: Self-pay | Admitting: Gastroenterology

## 2022-06-09 ENCOUNTER — Ambulatory Visit: Payer: Medicare HMO | Admitting: Gastroenterology

## 2022-06-09 ENCOUNTER — Ambulatory Visit (INDEPENDENT_AMBULATORY_CARE_PROVIDER_SITE_OTHER): Payer: Medicare HMO | Admitting: Gastroenterology

## 2022-06-09 VITALS — BP 132/73 | HR 78 | Temp 98.1°F | Ht 62.0 in | Wt 232.2 lb

## 2022-06-09 DIAGNOSIS — K219 Gastro-esophageal reflux disease without esophagitis: Secondary | ICD-10-CM | POA: Diagnosis not present

## 2022-06-09 DIAGNOSIS — Z1211 Encounter for screening for malignant neoplasm of colon: Secondary | ICD-10-CM

## 2022-06-09 MED ORDER — PEG 3350-KCL-NA BICARB-NACL 420 G PO SOLR: 4000.0000 mL | Freq: Once | ORAL | 0 refills | Status: AC

## 2022-06-09 NOTE — Patient Instructions (Signed)
We are arranging a routine screening colonoscopy in the near future. Please do not take metformin the day of the procedure.   Further recommendations to follow!  We will see you in 1 year!  I enjoyed seeing you again today! As you know, I value our relationship and want to provide genuine, compassionate, and quality care. I welcome your feedback. If you receive a survey regarding your visit,  I greatly appreciate you taking time to fill this out. See you next time!  Annitta Needs, PhD, ANP-BC [facility] Gastroenterology

## 2022-07-08 ENCOUNTER — Encounter (HOSPITAL_COMMUNITY): Payer: Medicare HMO

## 2022-07-12 ENCOUNTER — Encounter (HOSPITAL_COMMUNITY): Admission: RE | Payer: Self-pay | Source: Home / Self Care

## 2022-07-12 ENCOUNTER — Ambulatory Visit (HOSPITAL_COMMUNITY): Admission: RE | Admit: 2022-07-12 | Payer: Medicare HMO | Source: Home / Self Care

## 2022-07-12 SURGERY — COLONOSCOPY WITH PROPOFOL
Anesthesia: Monitor Anesthesia Care

## 2022-09-28 ENCOUNTER — Other Ambulatory Visit: Payer: Self-pay | Admitting: Family Medicine

## 2022-09-28 DIAGNOSIS — Z1231 Encounter for screening mammogram for malignant neoplasm of breast: Secondary | ICD-10-CM

## 2022-09-29 ENCOUNTER — Encounter: Payer: Self-pay | Admitting: *Deleted

## 2022-10-21 ENCOUNTER — Telehealth: Payer: Self-pay | Admitting: *Deleted

## 2022-10-21 NOTE — Telephone Encounter (Signed)
Pt called to cancel her procedure for 11/01/22. She says she has the flu and will call back once she is feeling better to reschedule.

## 2022-10-28 ENCOUNTER — Encounter (HOSPITAL_COMMUNITY): Payer: Medicare HMO

## 2022-11-01 ENCOUNTER — Encounter (HOSPITAL_COMMUNITY): Payer: Self-pay

## 2022-11-01 ENCOUNTER — Ambulatory Visit (HOSPITAL_COMMUNITY): Admit: 2022-11-01 | Payer: Medicare HMO

## 2022-11-01 SURGERY — COLONOSCOPY WITH PROPOFOL
Anesthesia: Monitor Anesthesia Care

## 2022-12-13 ENCOUNTER — Encounter: Payer: Self-pay | Admitting: Cardiology

## 2022-12-13 ENCOUNTER — Ambulatory Visit: Payer: Medicare HMO | Attending: Cardiology | Admitting: Cardiology

## 2022-12-13 VITALS — BP 130/92 | HR 80 | Ht 62.0 in | Wt 217.0 lb

## 2022-12-13 DIAGNOSIS — R002 Palpitations: Secondary | ICD-10-CM | POA: Diagnosis not present

## 2022-12-13 DIAGNOSIS — I10 Essential (primary) hypertension: Secondary | ICD-10-CM | POA: Diagnosis not present

## 2022-12-13 MED ORDER — CARVEDILOL 6.25 MG PO TABS: 6.2500 mg | ORAL_TABLET | Freq: Two times a day (BID) | ORAL | 3 refills | Status: DC

## 2022-12-13 NOTE — Progress Notes (Signed)
Electrophysiology Office Note   Date:  12/13/2022   ID:  Kathryn Olson, DOB [birth year], MRN [id number]  PCP:  Emelda Fear, DO  Primary Electrophysiologist:  Constance Haw, MD    No chief complaint on file.     History of Present Illness: Kathryn Olson is a 61 y.o. female who presents today for electrophysiology evaluation.     She has a history of significant CHF with a normalized ejection fraction, Crohn's disease, diabetes, hypertension, palpitations.  Palpitations improved with metoprolol.  [facility]actic reaction.  She had an echo that showed she had congestive heart failure.  She was having palpitations.  She wore a 30-day monitor without evidence of arrhythmia.  She has since been started on carvedilol with improvement in her palpitations.  Her ejection fraction is also normalized.  Today, denies symptoms of palpitations, shortness of breath, orthopnea, PND, lower extremity edema, claudication, dizziness, presyncope, syncope, bleeding, or neurologic sequela. The patient is tolerating medications without difficulties.  She is feeling well today.  She is under quite a bit of stress.  She has had multiple removed into her house for various reasons.  Previously there were 2 people living there and now they are 8.  She states that she has not had any further palpitations.  She does get a chest pain.  Pain occurs when she is eating, but sometimes also wakes her up from sleep.  She feels that her pain is due to stress and anxiety.  Pain is reproducible with palpation.   Past Medical History:  Diagnosis Date   Anxiety    Asthma    CHF (congestive heart failure) ([place])    Chronic abdominal pain    Chronic headaches    Complication of anesthesia    Crohn's disease ([place]) 03/2012   ? as of 06/05/2020: do not see endoscopic evidence or CT evidence of this. ?erroneous?    Diabetes mellitus without complication ([place])    Environmental allergies     Essential hypertension 03/11/2021   Family history of anesthesia complication    MH during C Section   H/O colonoscopy    History of electroencephalogram 03/2014   normal   IBS (irritable bowel syndrome) June 2014   Malignant hyperthemia    [name] /   Pt has been tested positive   Migraine    Obsessive-compulsive disorder    PTSD (post-traumatic stress disorder)    Seizures ([place])    Past Surgical History:  Procedure Laterality Date   BLADDER SURGERY     age 45   CESAREAN SECTION     X2   COLONOSCOPY  05/22/2012   SLF: Polyps, multiple hyperplastic in the sigmoid colon/Polyp in the rectum/  MODERATE Diverticulosis throughout the colon/ Internal hemorrhoids   ESOPHAGOGASTRODUODENOSCOPY     remote, had ulcers, [place], Dr. Truddie Coco   ESOPHAGOGASTRODUODENOSCOPY  05/22/2012   SLF: Stricture in the distal esophagus/Moderate gastritis   PARTIAL HYSTERECTOMY     TUBAL LIGATION       Current Outpatient Medications  Medication Sig Dispense Refill   albuterol (PROVENTIL) (5 MG/ML) 0.5% nebulizer solution Take 2.5 mg by nebulization every 6 (six) hours as needed for wheezing or shortness of breath.     albuterol (VENTOLIN HFA) 108 (90 Base) MCG/ACT inhaler Inhale 2 puffs into the lungs every 6 (six) hours as needed for wheezing or shortness of breath. 18 g 2   EPINEPHrine 0.3 mg/0.3 mL IJ SOAJ injection Inject  0.3 mg into the muscle as needed.     guaifenesin (ROBITUSSIN) 100 MG/5ML syrup Take 30 mLs by mouth 3 (three) times daily as needed for cough.     hydrocortisone 2.5 % ointment APPLY 1 APPLICATION 4 TIMES A DAY 40 g 1   hydrOXYzine (VISTARIL) 25 MG capsule Take 25 mg by mouth daily as needed for itching.     levocetirizine (XYZAL) 5 MG tablet Take 5 mg by mouth every evening.     linaclotide (LINZESS) 290 MCG CAPS capsule Take 290 mcg by mouth daily.     metFORMIN (GLUCOPHAGE) 500 MG tablet metformin 500 mg tablet  Take 1.5 tablets twice a day by oral route for 30  days.     NOVOLIN N RELION 100 UNIT/ML injection Inject 100 mLs into the skin in the morning, at noon, and at bedtime. Pt takes 10 units three times daily     omeprazole (PRILOSEC) 20 MG capsule Take 20 mg by mouth at bedtime. PRN     QUEtiapine (SEROQUEL) 200 MG tablet 1/2 pill qhs for 2 weeks, then increase to 1 po qhs. (Patient taking differently: Take 200 mg by mouth at bedtime. 1/2 pill qhs for 2 weeks, then increase to 1 po qhs.) 30 tablet 1   carvedilol (COREG) 6.25 MG tablet Take 1 tablet (6.25 mg total) by mouth 2 (two) times daily. 180 tablet 3   No current facility-administered medications for this visit.    Allergies:   Aspirin, Lithium bromide [lithium], Peanut oil, Peanut-containing drug products, Penicillins, Sulfa antibiotics, Sulfamethoxazole, Azithromycin, Other, Adhesive [tape], Codeine, Latex, and Rubbing alcohol [alcohol]   Social History:  The patient  reports that she quit smoking about 11 years ago. Her smoking use included cigarettes. She has never used smokeless tobacco. She reports that she does not drink alcohol and does not use drugs.   Family History:  The patient's family history includes Alcohol abuse in her father; Anxiety disorder in her sister and sister; Bipolar disorder in her sister; Colon cancer in her maternal grandfather; Dementia in her mother; Depression in her mother and sister; Diabetes in her mother; Heart attack in her father; Heart murmur in her son; Hypertension in her mother; Liver disease in her brother; Malignant hyperthermia in her daughter; OCD in her sister.   ROS:  Please see the history of present illness.   Otherwise, review of systems is positive for none.   All other systems are reviewed and negative.   PHYSICAL EXAM: VS:  BP (!) 130/92   Pulse 80   Ht '5\' 2"'$  (1.575 m)   Wt 217 lb (98.4 kg)   SpO2 97%   BMI 39.69 kg/m  , BMI Body mass index is 39.69 kg/m. GEN: Well nourished, well developed, in no acute distress  HEENT: normal   Neck: no JVD, carotid bruits, or masses Cardiac: RRR; no murmurs, rubs, or gallops,no edema  Respiratory:  clear to auscultation bilaterally, normal work of breathing GI: soft, nontender, nondistended, + BS MS: no deformity or atrophy  Skin: warm and dry Neuro:  Strength and sensation are intact Psych: euthymic mood, full affect  EKG:  EKG is ordered today. Personal review of the ekg ordered shows sinus rhythm   Recent Labs: No results found for requested labs within last 365 days.    Lipid Panel     Component Value Date/Time   CHOL 203 (H) 09/04/2013 1728   TRIG 185 (H) 09/04/2013 1728   HDL 59 09/04/2013 1728  LDLCALC 107 (H) 09/04/2013 1728     Wt Readings from Last 3 Encounters:  12/13/22 217 lb (98.4 kg)  06/09/22 232 lb 3.2 oz (105.3 kg)  08/27/21 222 lb (100.7 kg)      Other studies Reviewed: Additional studies/ records that were reviewed today include: Myoview 01/05/17 Nuclear stress EF: 55%. There was no ST segment deviation noted during stress. The study is normal. The left ventricular ejection fraction is normal (55-65%).   1. EF 55%, normal wall motion.  2. No significant perfusion defect, no evidence for ischemia or infarction.  3. Normal study.    ASSESSMENT AND PLAN:  1.  Hypertension: Currently well-controlled on carvedilol  2.  Palpitations: Improved with beta-blockers.  Has had no further episodes.  Continues to do well without complaint.  3.  History of CHF: Ejection fraction is normalized.  No changes.  4.  Chest pain: Patient thinks that this is due to anxiety and stress as she has many people living with her and has multiple stresses at home.  Pain is reproducible to palpation.  No further workup.  Current medicines are reviewed at length with the patient today.   The patient does not have concerns regarding her medicines.  The following changes were made today: None  Labs/ tests ordered today include:  Orders Placed This Encounter   Procedures   EKG 12-Lead      Disposition:   FU 12 month  Signed, Goebel Hellums Meredith Leeds, MD  12/13/2022 8:47 AM     [facility] [address] [place] [address] [phone] (office) [phone] (fax)

## 2022-12-13 NOTE — Patient Instructions (Signed)
Medication Instructions:  °Your physician recommends that you continue on your current medications as directed. Please refer to the Current Medication list given to you today. ° °*If you need a refill on your cardiac medications before your next appointment, please call your pharmacy* ° ° °Lab Work: °None ordered ° ° °Testing/Procedures: °None ordered ° ° °Follow-Up: °At [facility], you and your health needs are our priority.  As part of our continuing mission to provide you with exceptional heart care, we have created designated Provider Care Teams.  These Care Teams include your primary Cardiologist (physician) and Advanced Practice Providers (APPs -  Physician Assistants and Nurse Practitioners) who all work together to provide you with the care you need, when you need it. ° °Your next appointment:   °1 year(s) ° °The format for your next appointment:   °In Person ° °Provider:   °Will Camnitz, MD ° ° ° °Thank you for choosing [facility]!! ° ° °Juel Ripley, RN °[phone] ° ° ° °

## 2023-01-20 LAB — HEMOGLOBIN A1C: Hemoglobin A1C: 15.3

## 2023-01-20 LAB — MICROALBUMIN / CREATININE URINE RATIO: Microalb Creat Ratio: 9

## 2023-01-20 LAB — BASIC METABOLIC PANEL
BUN: 14 (ref 4–21)
Creatinine: 0.7 (ref 0.5–1.1)
Glucose: 449

## 2023-01-20 LAB — COMPREHENSIVE METABOLIC PANEL: eGFR: 107

## 2023-01-20 LAB — HEPATIC FUNCTION PANEL: Alkaline Phosphatase: 141 — AB (ref 25–125)

## 2023-02-10 ENCOUNTER — Encounter: Payer: Self-pay | Admitting: Nurse Practitioner

## 2023-02-28 ENCOUNTER — Ambulatory Visit: Payer: Medicare HMO

## 2023-03-02 ENCOUNTER — Ambulatory Visit: Payer: Medicare HMO

## 2023-03-21 ENCOUNTER — Ambulatory Visit
Admission: RE | Admit: 2023-03-21 | Discharge: 2023-03-21 | Disposition: A | Discharge status: Home / Self Care | Payer: Medicare HMO | Source: Ambulatory Visit | Attending: Family Medicine | Admitting: Family Medicine

## 2023-03-21 DIAGNOSIS — Z1231 Encounter for screening mammogram for malignant neoplasm of breast: Secondary | ICD-10-CM

## 2023-04-05 ENCOUNTER — Ambulatory Visit (INDEPENDENT_AMBULATORY_CARE_PROVIDER_SITE_OTHER): Payer: Medicare HMO | Admitting: Nurse Practitioner

## 2023-04-05 ENCOUNTER — Encounter: Payer: Self-pay | Admitting: Nurse Practitioner

## 2023-04-05 VITALS — BP 140/81 | HR 82 | Ht 62.0 in | Wt 220.2 lb

## 2023-04-05 DIAGNOSIS — E1165 Type 2 diabetes mellitus with hyperglycemia: Secondary | ICD-10-CM | POA: Diagnosis not present

## 2023-04-05 DIAGNOSIS — Z794 Long term (current) use of insulin: Secondary | ICD-10-CM | POA: Diagnosis not present

## 2023-04-05 DIAGNOSIS — I10 Essential (primary) hypertension: Secondary | ICD-10-CM

## 2023-04-05 DIAGNOSIS — E782 Mixed hyperlipidemia: Secondary | ICD-10-CM | POA: Diagnosis not present

## 2023-04-05 DIAGNOSIS — Z7984 Long term (current) use of oral hypoglycemic drugs: Secondary | ICD-10-CM

## 2023-04-05 MED ORDER — METFORMIN HCL ER 500 MG PO TB24: 500.0000 mg | ORAL_TABLET | Freq: Every day | ORAL | 1 refills | Status: DC

## 2023-04-05 MED ORDER — PEN NEEDLES 31G X 6 MM MISC: 3 refills | Status: AC

## 2023-04-05 MED ORDER — LANTUS SOLOSTAR 100 UNIT/ML ~~LOC~~ SOPN: 30.0000 [IU] | PEN_INJECTOR | Freq: Every day | SUBCUTANEOUS | 3 refills | Status: DC

## 2023-04-05 NOTE — Patient Instructions (Signed)

## 2023-04-05 NOTE — Progress Notes (Signed)
Endocrinology Consult Note       04/05/2023, 3:28 PM   Subjective:    Patient ID: Kathryn Olson, female    DOB: [birth year].  Kathryn Olson is being seen in consultation for management of currently uncontrolled symptomatic diabetes requested by  Lorelei Pont, DO.   Past Medical History:  Diagnosis Date   Anxiety    Asthma    CHF (congestive heart failure) (HCC)    Chronic abdominal pain    Chronic headaches    Complication of anesthesia    Crohn's disease (HCC) 03/2012   ? as of 06/05/2020: do not see endoscopic evidence or CT evidence of this. ?erroneous?    Diabetes mellitus without complication (HCC)    Environmental allergies    Essential hypertension 03/11/2021   Family history of anesthesia complication    MH during C Section   H/O colonoscopy    History of electroencephalogram 03/2014   normal   IBS (irritable bowel syndrome) June 2014   Malignant hyperthemia    [name] /   Pt has been tested positive   Migraine    Obsessive-compulsive disorder    PTSD (post-traumatic stress disorder)    Seizures (HCC)     Past Surgical History:  Procedure Laterality Date   BLADDER SURGERY     age 62   CESAREAN SECTION     X2   COLONOSCOPY  05/22/2012   SLF: Polyps, multiple hyperplastic in the sigmoid colon/Polyp in the rectum/  MODERATE Diverticulosis throughout the colon/ Internal hemorrhoids   ESOPHAGOGASTRODUODENOSCOPY     remote, had ulcers, [place], Dr. Donnie Coffin   ESOPHAGOGASTRODUODENOSCOPY  05/22/2012   SLF: Stricture in the distal esophagus/Moderate gastritis   PARTIAL HYSTERECTOMY     TUBAL LIGATION      Social History   Socioeconomic History   Marital status: Married    Spouse name: Not on file   Number of children: 2   Years of education: Not on file   Highest education level: Not on file  Occupational History   Occupation: plant, manual labor, lifting   Occupation:  Disabled   Tobacco Use   Smoking status: Former    Types: Cigarettes    Quit date: 01/02/2011    Years since quitting: 12.2   Smokeless tobacco: Never   Tobacco comments:    Quit a few years ago  Vaping Use   Vaping Use: Never used  Substance and Sexual Activity   Alcohol use: No   Drug use: No   Sexual activity: Yes    Birth control/protection: Surgical  Other Topics Concern   Not on file  Social History Narrative   Patient is married with 2 children.   Patient is disabled.   Patient has 12+ years or schooling    Patient states that she can write with both hands.          Social Determinants of Health   Financial Resource Strain: Not on file  Food Insecurity: Not on file  Transportation Needs: Not on file  Physical Activity: Not on file  Stress: Not on file  Social Connections: Not on file    Family History  Problem Relation Age of Onset  Colon cancer Maternal Grandfather        before the age of 23   Heart attack Father    Alcohol abuse Father    Diabetes Mother    Hypertension Mother    Dementia Mother    Depression Mother    Liver disease Brother        does not know details, but related to MVA?   Anxiety disorder Sister    Bipolar disorder Sister    OCD Sister    Anxiety disorder Sister    Depression Sister    Malignant hyperthermia Daughter    Heart murmur Son    Inflammatory bowel disease Neg Hx    ADD / ADHD Neg Hx    Drug abuse Neg Hx    Paranoid behavior Neg Hx    Schizophrenia Neg Hx    Seizures Neg Hx    Sexual abuse Neg Hx    Physical abuse Neg Hx    Colon polyps Neg Hx     Outpatient Encounter Medications as of 04/05/2023  Medication Sig   albuterol (PROVENTIL) (5 MG/ML) 0.5% nebulizer solution Take 2.5 mg by nebulization every 6 (six) hours as needed for wheezing or shortness of breath.   albuterol (VENTOLIN HFA) 108 (90 Base) MCG/ACT inhaler Inhale 2 puffs into the lungs every 6 (six) hours as needed for wheezing or shortness of  breath.   carvedilol (COREG) 6.25 MG tablet Take 1 tablet (6.25 mg total) by mouth 2 (two) times daily.   EPINEPHrine 0.3 mg/0.3 mL IJ SOAJ injection Inject 0.3 mg into the muscle as needed.   guaifenesin (ROBITUSSIN) 100 MG/5ML syrup Take 30 mLs by mouth 3 (three) times daily as needed for cough.   hydrocortisone 2.5 % ointment APPLY 1 APPLICATION 4 TIMES A DAY   hydrOXYzine (VISTARIL) 25 MG capsule Take 25 mg by mouth daily as needed for itching.   insulin glargine (LANTUS SOLOSTAR) 100 UNIT/ML Solostar Pen Inject 30 Units into the skin at bedtime.   Insulin Pen Needle (PEN NEEDLES) 31G X 6 MM MISC Use to inject insulin once daily   levocetirizine (XYZAL) 5 MG tablet Take 5 mg by mouth every evening.   linaclotide (LINZESS) 290 MCG CAPS capsule Take 290 mcg by mouth daily.   metFORMIN (GLUCOPHAGE-XR) 500 MG 24 hr tablet Take 1 tablet (500 mg total) by mouth daily with breakfast.   omeprazole (PRILOSEC) 20 MG capsule Take 20 mg by mouth at bedtime. PRN   QUEtiapine (SEROQUEL) 200 MG tablet 1/2 pill qhs for 2 weeks, then increase to 1 po qhs. (Patient taking differently: Take 200 mg by mouth at bedtime. 1/2 pill qhs for 2 weeks, then increase to 1 po qhs.)   [DISCONTINUED] metFORMIN (GLUCOPHAGE) 500 MG tablet metformin 500 mg tablet  Take 1.5 tablets twice a day by oral route for 30 days.   [DISCONTINUED] NOVOLIN N RELION 100 UNIT/ML injection Inject 100 mLs into the skin in the morning, at noon, and at bedtime. Pt takes 10 units three times daily   No facility-administered encounter medications on file as of 04/05/2023.    ALLERGIES: Allergies  Allergen Reactions   Aspirin Anaphylaxis and Swelling   Lithium Bromide [Lithium] Anaphylaxis   Peanut Oil Anaphylaxis   Peanut-Containing Drug Products Anaphylaxis        Penicillins Anaphylaxis    Stops her heart. Has patient had a PCN reaction causing immediate rash, facial/tongue/throat swelling, SOB or lightheadedness with hypotension:  yes Has patient had a PCN reaction  causing severe rash involving mucus membranes or skin necrosis: no Has patient had a PCN reaction that required hospitalization: yes Has patient had a PCN reaction occurring within the last 10 years: yes If all of the above answers are "NO", then may proceed with Cephalosporin use.  Other reaction(s): Other (See Comments)   Sulfa Antibiotics Anaphylaxis   Sulfamethoxazole Anaphylaxis and Itching   Azithromycin Other (See Comments)   Other     Other reaction(s): anaphylaxis Other reaction(s): Other (See Comments)   Adhesive [Tape] Rash   Codeine Itching and Rash   Latex Rash   Rubbing Alcohol [Alcohol] Rash    VACCINATION STATUS: Immunization History  Administered Date(s) Administered   Influenza Inj Mdck Quad Pf 09/13/2016   Pneumococcal Polysaccharide-23 04/12/2017    Diabetes She presents for her initial diabetic visit. She has type 2 diabetes mellitus. Onset time: diagnosed at age 41 (relatively new in August 2023) Her disease course has been worsening. There are no hypoglycemic associated symptoms. Associated symptoms include fatigue, polydipsia and polyuria. There are no hypoglycemic complications. There are no diabetic complications. Risk factors for coronary artery disease include diabetes mellitus, family history, obesity, hypertension and sedentary lifestyle. Current diabetic treatment includes intensive insulin program and oral agent (monotherapy) (Currently on Novolin 10 units TID, Metformin 750 mg po daily- she did not bring her meds with her and did not seem to know exactly what she was taking). (She presents today for her consultation with no meter or logs to review.  Her most recent A1c on 01/20/23 was 15.3%.  She notes she was diagnosed with diabetes during a hospitalization in August 2023 and was put on insulin without much education as to how to treat her diabetes at home.  She does not check glucose routinely.  She drinks water mostly,  some juice occasionally.  She eats 2 meals per day (skipping lunch most days) and occasionally snacks on fruit.  She does walk but admits it is not enough.  She has upcoming eye appointment, has seen podiatry in the distant past.  She notes increased stress in her household, likely affecting her glucose management as well.)     Review of systems  Constitutional: + Minimally fluctuating body weight, current Body mass index is 40.28 kg/m., + fatigue, no subjective hyperthermia, no subjective hypothermia Eyes: no blurry vision, no xerophthalmia ENT: no sore throat, no nodules palpated in throat, no dysphagia/odynophagia, no hoarseness Cardiovascular: no chest pain, no shortness of breath, no palpitations, no leg swelling Respiratory: no cough, no shortness of breath Gastrointestinal: no nausea/vomiting/diarrhea Musculoskeletal: no muscle/joint aches Skin: no rashes, no hyperemia Neurological: no tremors, no numbness, no tingling, no dizziness Psychiatric: + depression/anxiety- increased stress levels lately  Objective:     BP (!) 140/81 (BP Location: Left Arm, Patient Position: Sitting, Cuff Size: Large) Comment: Patient states that she has not taken BP meds today  Pulse 82   Ht 5\' 2"  (1.575 m)   Wt 220 lb 3.2 oz (99.9 kg)   BMI 40.28 kg/m   Wt Readings from Last 3 Encounters:  04/05/23 220 lb 3.2 oz (99.9 kg)  12/13/22 217 lb (98.4 kg)  06/09/22 232 lb 3.2 oz (105.3 kg)     BP Readings from Last 3 Encounters:  04/05/23 (!) 140/81  12/13/22 (!) 130/92  06/09/22 132/73     Physical Exam- Limited  Constitutional:  Body mass index is 40.28 kg/m. , not in acute distress, normal state of mind Eyes:  EOMI, no exophthalmos Neck: Supple  Cardiovascular: RRR, no murmurs, rubs, or gallops, no edema Respiratory: Adequate breathing efforts, no crackles, rales, rhonchi, or wheezing Musculoskeletal: no gross deformities, strength intact in all four extremities, no gross restriction of  joint movements Skin:  no rashes, no hyperemia Neurological: no tremor with outstretched hands  Diabetic Foot Exam - Simple   No data filed     CMP ( most recent) CMP     Component Value Date/Time   NA 139 01/19/2021 2234   NA 136 12/31/2019 1130   K 4.1 01/19/2021 2234   K 4.2 05/29/2012 0916   CL 102 01/19/2021 2234   CL 103 05/29/2012 0916   CO2 25 01/19/2021 2234   CO2 27 05/29/2012 0916   GLUCOSE 326 (H) 01/19/2021 2234   BUN 14 01/20/2023 0000   CREATININE 0.7 01/20/2023 0000   CREATININE 0.77 01/19/2021 2234   CREATININE 0.70 05/29/2012 0916   CALCIUM 9.1 01/19/2021 2234   CALCIUM 9.8 05/29/2012 0916   PROT 7.4 01/19/2021 2234   PROT 7.0 09/04/2013 1728   PROT 7.2 05/29/2012 0916   ALBUMIN 3.7 01/19/2021 2234   ALBUMIN 4.3 09/04/2013 1728   AST 15 01/19/2021 2234   AST 17 05/29/2012 0916   ALT 12 01/19/2021 2234   ALKPHOS 141 (A) 01/20/2023 0000   ALKPHOS 74 05/29/2012 0916   BILITOT 0.5 01/19/2021 2234   BILITOT 0.3 05/29/2012 0916   EGFR 107 01/20/2023 0000   GFRNONAA >60 01/19/2021 2234     Diabetic Labs (most recent): Lab Results  Component Value Date   HGBA1C 15.3 01/20/2023   HGBA1C 6.2 (H) 08/22/2012     Lipid Panel ( most recent) Lipid Panel     Component Value Date/Time   CHOL 203 (H) 09/04/2013 1728   TRIG 185 (H) 09/04/2013 1728   HDL 59 09/04/2013 1728   LDLCALC 107 (H) 09/04/2013 1728      Lab Results  Component Value Date   TSH 0.944 09/04/2013   TSH 1.253 08/22/2012   TSH 1.149 03/29/2011   FREET4 1.08 03/29/2011           Assessment & Plan:   1) Type 2 diabetes mellitus with hyperglycemia, with long-term current use of insulin (HCC)  She presents today for her consultation with no meter or logs to review.  Her most recent A1c on 01/20/23 was 15.3%.  She notes she was diagnosed with diabetes during a hospitalization in August 2023 and was put on insulin without much education as to how to treat her diabetes at home.   She does not check glucose routinely.  She drinks water mostly, some juice occasionally.  She eats 2 meals per day (skipping lunch most days) and occasionally snacks on fruit.  She does walk but admits it is not enough.  She has upcoming eye appointment, has seen podiatry in the distant past.  She notes increased stress in her household, likely affecting her glucose management as well.  - Star Age has currently uncontrolled symptomatic type 2 DM since 62 years of age, with most recent A1c of 15.3 %.   -Recent labs reviewed.  - I had a long discussion with her about the progressive nature of diabetes and the pathology behind its complications. -her diabetes is not currently complicated but she remains at a high risk for more acute and chronic complications which include CAD, CVA, CKD, retinopathy, and neuropathy. These are all discussed in detail with her.  The following Lifestyle Medicine recommendations according to [facility]  of Lifestyle Medicine [facility]) were discussed and offered to patient and she agrees to start the journey:  A. Whole Foods, Plant-based plate comprising of fruits and vegetables, plant-based proteins, whole-grain carbohydrates was discussed in detail with the patient.   A list for source of those nutrients were also provided to the patient.  Patient will use only water or unsweetened tea for hydration. B.  The need to stay away from risky substances including alcohol, smoking; obtaining 7 to 9 hours of restorative sleep, at least 150 minutes of moderate intensity exercise weekly, the importance of healthy social connections,  and stress reduction techniques were discussed. C.  A full color page of  Calorie density of various food groups per pound showing examples of each food groups was provided to the patient.  - I have counseled her on diet and weight management by adopting a carbohydrate restricted/protein rich diet. Patient is encouraged to switch to unprocessed or  minimally processed complex starch and increased protein intake (animal or plant source), fruits, and vegetables. -  she is advised to stick to a routine mealtimes to eat 3 meals a day and avoid unnecessary snacks (to snack only to correct hypoglycemia).   - she acknowledges that there is a room for improvement in her food and drink choices. - Suggestion is made for her to avoid simple carbohydrates from her diet including Cakes, Sweet Desserts, Ice Cream, Soda (diet and regular), Sweet Tea, Candies, Chips, Cookies, Store Bought Juices, Alcohol in Excess of 1-2 drinks a day, Artificial Sweeteners, Coffee Creamer, and "Sugar-free" Products. This will help patient to have more stable blood glucose profile and potentially avoid unintended weight gain.  - I have approached her with the following individualized plan to manage her diabetes and patient agrees:   -I discussed and changed her insulin regimen to basal insulin only with Lantus 30 units SQ nightly.  The prandial insulin she has been on was too much for her to handle, she was overwhelmed and often forgot to take it altogether.   -she is encouraged to start monitoring glucose 4 times daily, before meals and before bed, to log their readings on the clinic sheets provided, and bring them to review at follow up appointment in 4 weeks.  She could benefit from CGM device, I sent order for Dexcom G7 to [facility].  - she is warned not to take insulin without proper monitoring per orders. - Adjustment parameters are given to her for hypo and hyperglycemia in writing. - she is encouraged to call clinic for blood glucose levels less than 70 or above 300 mg /dl.  - I did change her Metformin to 500 mg ER daily after breakfast (given GI issues and to increase compliance). - her NPH will be discontinued,, there are other superior products than intermediate acting insulin.  - she will be considered for incretin therapy as appropriate next visit, however cost  is a concern for her.  She did take Ozempic in the past and did well but could no longer afford it.  - Specific targets for  A1c; LDL, HDL, and Triglycerides were discussed with the patient.  2) Blood Pressure /Hypertension:  her blood pressure is controlled to target.   she is advised to continue her current medications including Coreg 6.25 mg po twice daily.  3) Lipids/Hyperlipidemia:    There is no recent lipid panel to review, nor is she on any lipid lowering medications. Will recheck lipid panel on subsequent visits.  4)  Weight/Diet:  her Body mass index is 40.28 kg/m.  -  clearly complicating her diabetes care.   she is a candidate for weight loss. I discussed with her the fact that loss of 5 - 10% of her  current body weight will have the most impact on her diabetes management.  Exercise, and detailed carbohydrates information provided  -  detailed on discharge instructions.  5) Chronic Care/Health Maintenance: -she is not on ACEI/ARB or Statin medications and is encouraged to initiate and continue to follow up with Ophthalmology, Dentist, Podiatrist at least yearly or according to recommendations, and advised to stay away from smoking. I have recommended yearly flu vaccine and pneumonia vaccine at least every 5 years; moderate intensity exercise for up to 150 minutes weekly; and sleep for at least 7 hours a day.  - she is advised to maintain close follow up with Lorelei Pont, DO for primary care needs, as well as her other providers for optimal and coordinated care.   - Time spent in this patient care: 60 min, of which > 50% was spent in counseling her about her diabetes and the rest reviewing her blood glucose logs, discussing her hypoglycemia and hyperglycemia episodes, reviewing her current and previous labs/studies (including abstraction from other facilities) and medications doses and developing a long term treatment plan based on the latest standards of care/guidelines; and  documenting her care.    Please refer to Patient Instructions for Blood Glucose Monitoring and Insulin/Medications Dosing Guide" in media tab for additional information. Please also refer to "Patient Self Inventory" in the Media tab for reviewed elements of pertinent patient history.  Star Age participated in the discussions, expressed understanding, and voiced agreement with the above plans.  All questions were answered to her satisfaction. she is encouraged to contact clinic should she have any questions or concerns prior to her return visit.     Follow up plan: - Return in about 4 weeks (around 05/03/2023) for Diabetes F/U, Bring meter and logs.    Ronny Bacon, [facility] [address] [place], [place] [address] Phone: [phone] Fax: [phone]  04/05/2023, 3:28 PM

## 2023-04-07 ENCOUNTER — Encounter: Payer: Self-pay | Admitting: Physician Assistant

## 2023-04-07 ENCOUNTER — Telehealth: Payer: Self-pay | Admitting: *Deleted

## 2023-04-07 ENCOUNTER — Ambulatory Visit (INDEPENDENT_AMBULATORY_CARE_PROVIDER_SITE_OTHER): Payer: Medicare HMO | Admitting: Physician Assistant

## 2023-04-07 DIAGNOSIS — F411 Generalized anxiety disorder: Secondary | ICD-10-CM | POA: Diagnosis not present

## 2023-04-07 DIAGNOSIS — F4310 Post-traumatic stress disorder, unspecified: Secondary | ICD-10-CM

## 2023-04-07 DIAGNOSIS — G4733 Obstructive sleep apnea (adult) (pediatric): Secondary | ICD-10-CM | POA: Diagnosis not present

## 2023-04-07 DIAGNOSIS — F319 Bipolar disorder, unspecified: Secondary | ICD-10-CM

## 2023-04-07 MED ORDER — TRESIBA FLEXTOUCH 100 UNIT/ML ~~LOC~~ SOPN: 30.0000 [IU] | PEN_INJECTOR | Freq: Every day | SUBCUTANEOUS | 3 refills | Status: DC

## 2023-04-07 MED ORDER — QUETIAPINE FUMARATE 200 MG PO TABS: ORAL_TABLET | ORAL | 1 refills | Status: DC

## 2023-04-07 NOTE — Addendum Note (Signed)
Addended by: Dani Gobble on: 04/07/2023 04:02 PM   Modules accepted: Orders
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
## 2023-04-07 NOTE — Telephone Encounter (Signed)
Patient called and very concerned about her insulin. It is to expensive for her, and with her insurance she cannot use any coupons, ect. The Lantus and the Evaristo Bury both are to expensive for her to pay for . Talked with Whitney , she ask that we call the PCP to see if they might have any insulin they could sample the patient. Dr, [facility] office manager checked and they did have vials of the Lantus, patient is to pick up tomorrow between 8:00 am- 4:30 pm. Patient was called and made aware, we are also mailing her a patient assistance application to see if she may be able for their program.

## 2023-04-11 ENCOUNTER — Telehealth: Payer: Self-pay | Admitting: *Deleted

## 2023-04-11 NOTE — Telephone Encounter (Signed)
Patient was mailed application for the assistance program to get her Lantus. The physician part is in the brown folder,nurse office , it will need to be completed.

## 2023-04-27 ENCOUNTER — Encounter: Payer: Self-pay | Admitting: *Deleted

## 2023-04-29 ENCOUNTER — Other Ambulatory Visit: Payer: Self-pay | Admitting: Physician Assistant

## 2023-05-03 ENCOUNTER — Ambulatory Visit: Payer: Medicare HMO | Admitting: Nurse Practitioner

## 2023-05-10 ENCOUNTER — Ambulatory Visit: Payer: Medicare HMO | Admitting: Nurse Practitioner

## 2023-05-10 VITALS — BP 138/74 | HR 76 | Ht 62.0 in | Wt 229.8 lb

## 2023-05-10 DIAGNOSIS — E1165 Type 2 diabetes mellitus with hyperglycemia: Secondary | ICD-10-CM

## 2023-05-10 DIAGNOSIS — Z7984 Long term (current) use of oral hypoglycemic drugs: Secondary | ICD-10-CM

## 2023-05-10 DIAGNOSIS — E782 Mixed hyperlipidemia: Secondary | ICD-10-CM

## 2023-05-10 DIAGNOSIS — Z794 Long term (current) use of insulin: Secondary | ICD-10-CM

## 2023-05-10 DIAGNOSIS — I10 Essential (primary) hypertension: Secondary | ICD-10-CM

## 2023-05-10 NOTE — Progress Notes (Unsigned)
Patient presented to the office , she had received her Freestyle Libre 3 CGM and sensors. Due to the patient not having any readings, patient was seen by the nurse and the the sensor was applied and the CGM was setup. Patient understood  how to apply the sensor, and also understood her CGM. The sensor was applied to the inner upper part of the patient's left arm. She is taking [place] 30 units at bedtime and Metformin 500 mg with her breakfast. Patient was given appointment to come in and see Whitney on August 22, at 2 pm. She was also advised to call us with any questions and or concerns.

## 2023-05-12 ENCOUNTER — Encounter: Payer: Self-pay | Admitting: Gastroenterology

## 2023-05-19 ENCOUNTER — Ambulatory Visit: Payer: Medicare HMO | Admitting: Physician Assistant

## 2023-06-07 ENCOUNTER — Telehealth (INDEPENDENT_AMBULATORY_CARE_PROVIDER_SITE_OTHER): Payer: Medicare HMO | Admitting: Physician Assistant

## 2023-06-07 ENCOUNTER — Encounter: Payer: Self-pay | Admitting: Physician Assistant

## 2023-06-07 DIAGNOSIS — F319 Bipolar disorder, unspecified: Secondary | ICD-10-CM | POA: Diagnosis not present

## 2023-06-07 DIAGNOSIS — F411 Generalized anxiety disorder: Secondary | ICD-10-CM | POA: Diagnosis not present

## 2023-06-07 NOTE — Progress Notes (Signed)
[facility]  Patient ID: Kathryn Olson,  MRN: [id number]  PCP: Kathryn Pont, DO  Date of Evaluation: 06/07/2023 Time spent:20 minutes  Chief Complaint:  Chief Complaint   Depression; Follow-up   Virtual Visit via Telehealth  I connected with patient by a video enabled telemedicine application with their informed consent, and verified patient privacy and that I am speaking with the correct person using two identifiers.  I am private, in my office and the patient is at home.  I discussed the limitations, risks, security and privacy concerns of performing an evaluation and management service by video and the availability of in person appointments. I also discussed with the patient that there may be a patient responsible charge related to this service. The patient expressed understanding and agreed to proceed.   I discussed the assessment and treatment plan with the patient. The patient was provided an opportunity to ask questions and all were answered. The patient agreed with the plan and demonstrated an understanding of the instructions.   The patient was advised to call back or seek an in-person evaluation if the symptoms worsen or if the condition fails to improve as anticipated.  I provided 20  minutes of non-face-to-face time during this encounter.  HISTORY/CURRENT STATUS: For routine med check.  Seroquel [name] restarted 2 months ago.  States she is feeling better as far as her mood goes.  There is a lot going on, family stressors but she is handling things well.  Not having panic attacks.  She rarely takes the hydroxyzine so unsure if it would help or not.  Sleeps fairly well.  Patient is able to enjoy things.  Energy and motivation are fair to good depending on the day.  No extreme sadness, tearfulness, or feelings of hopelessness.  ADLs and personal hygiene are normal.   Denies any changes in concentration, making decisions, or remembering things.  Appetite has not  changed.  Weight is stable.  Denies suicidal or homicidal thoughts.  Patient denies increased energy with decreased need for sleep, increased talkativeness, racing thoughts, impulsivity or risky behaviors, increased spending, increased libido, grandiosity, increased irritability or anger, paranoia, or hallucinations.  Denies dizziness, syncope, seizures, numbness, tingling, tremor, tics, unsteady gait, slurred speech, confusion. Denies muscle or joint pain, stiffness, or dystonia.Denies unexplained weight loss, frequent infections, or sores that heal slowly.  No polyphagia, polydipsia, or polyuria. Denies visual changes or paresthesias.   Individual Medical History/ Review of Systems: Changes? :No     Past medications for mental health diagnoses include: Equetro, Seroquel, VPA, Xanax, Buspar, Zoloft, Elavil  Allergies: Aspirin, Lithium bromide [lithium], Peanut oil, Peanut-containing drug products, Penicillins, Sulfa antibiotics, Sulfamethoxazole, Azithromycin, Other, Adhesive [tape], Codeine, Latex, and Rubbing alcohol [alcohol]  Current Medications:  Current Outpatient Medications:    albuterol (PROVENTIL) (5 MG/ML) 0.5% nebulizer solution, Take 2.5 mg by nebulization every 6 (six) hours as needed for wheezing or shortness of breath., Disp: , Rfl:    albuterol (VENTOLIN HFA) 108 (90 Base) MCG/ACT inhaler, Inhale 2 puffs into the lungs every 6 (six) hours as needed for wheezing or shortness of breath., Disp: 18 g, Rfl: 2   carvedilol (COREG) 6.25 MG tablet, Take 1 tablet (6.25 mg total) by mouth 2 (two) times daily., Disp: 180 tablet, Rfl: 3   hydrocortisone 2.5 % ointment, APPLY 1 APPLICATION 4 TIMES A DAY, Disp: 40 g, Rfl: 1   hydrOXYzine (VISTARIL) 25 MG capsule, Take 25 mg by mouth daily as needed for itching., Disp: , Rfl:  insulin glargine (LANTUS SOLOSTAR) 100 UNIT/ML Solostar Pen, Inject 10 units every day by subcutaneous route at bedtime for 30 days., Disp: , Rfl:    Insulin Pen  Needle (PEN NEEDLES) 31G X 6 MM MISC, Use to inject insulin once daily, Disp: 100 each, Rfl: 3   levocetirizine (XYZAL) 5 MG tablet, Take 5 mg by mouth every evening., Disp: , Rfl:    linaclotide (LINZESS) 290 MCG CAPS capsule, Take 290 mcg by mouth daily., Disp: , Rfl:    metFORMIN (GLUCOPHAGE-XR) 500 MG 24 hr tablet, Take 1 tablet (500 mg total) by mouth daily with breakfast., Disp: 90 tablet, Rfl: 1   omeprazole (PRILOSEC) 20 MG capsule, Take 20 mg by mouth at bedtime. PRN, Disp: , Rfl:    QUEtiapine (SEROQUEL) 200 MG tablet, TAKE 1 TABLET AT BEDTIME, Disp: 90 tablet, Rfl: 1   EPINEPHrine 0.3 mg/0.3 mL IJ SOAJ injection, Inject 0.3 mg into the muscle as needed. (Patient not taking: Reported on 04/07/2023), Disp: , Rfl:    guaifenesin (ROBITUSSIN) 100 MG/5ML syrup, Take 30 mLs by mouth 3 (three) times daily as needed for cough. (Patient not taking: Reported on 05/10/2023), Disp: , Rfl:    insulin degludec (TRESIBA FLEXTOUCH) 100 UNIT/ML FlexTouch Pen, Inject 30 Units into the skin at bedtime. (Patient not taking: Reported on 06/07/2023), Disp: 30 mL, Rfl: 3 Medication Side Effects: none  Family Medical/ Social History: Changes? No  MENTAL HEALTH EXAM:  There were no vitals taken for this visit.There is no height or weight on file to calculate BMI.  General Appearance: Casual, Well Groomed, and Obese  Eye Contact:  Good  Speech:  Clear and Coherent and Normal Rate  Volume:  Normal  Mood:  Euthymic  Affect:  Congruent  Thought Process:  Goal Directed and Descriptions of Associations: Intact  Orientation:  Full (Time, Place, and Person)  Thought Content: Logical   Suicidal Thoughts:  No  Homicidal Thoughts:  No  Memory:  WNL  Judgement:  Good  Insight:  Good  Psychomotor Activity:  Normal  Concentration:  Concentration: Good and Attention Span: Good  Recall:  Good  Fund of Knowledge: Good  Language: Good  Assets:  Desire for Improvement  ADL's:  Intact  Cognition: WNL  Prognosis:   Good   DIAGNOSES:    ICD-10-CM   1. Bipolar I disorder (HCC)  F31.9     2. Generalized anxiety disorder  F41.1       Receiving Psychotherapy: No   RECOMMENDATIONS:  PDMP reviewed.  No controlled substances listed. I provided 20 minutes of non-face-to-face time during this encounter, including time spent before and after the visit in records review, medical decision making, counseling pertinent to today's visit, and charting.   Overall she is doing better with the Seroquel.  No changes at this time.  Continue hydroxyzine 25 mg, 1 p.o. daily as needed. Cont Seroquel 200 mg, 1 p.o. nightly. Recommend counseling Return in 3 months.  Melony Overly, PA-C

## 2023-06-09 ENCOUNTER — Ambulatory Visit: Payer: Medicare HMO | Admitting: Nurse Practitioner

## 2023-06-16 ENCOUNTER — Encounter: Payer: Self-pay | Admitting: Nurse Practitioner

## 2023-06-16 ENCOUNTER — Ambulatory Visit: Payer: Medicare HMO | Admitting: Nurse Practitioner

## 2023-06-16 VITALS — BP 140/80 | HR 74 | Ht 62.0 in | Wt 232.8 lb

## 2023-06-16 DIAGNOSIS — E1165 Type 2 diabetes mellitus with hyperglycemia: Secondary | ICD-10-CM

## 2023-06-16 DIAGNOSIS — E782 Mixed hyperlipidemia: Secondary | ICD-10-CM

## 2023-06-16 DIAGNOSIS — Z794 Long term (current) use of insulin: Secondary | ICD-10-CM | POA: Diagnosis not present

## 2023-06-16 DIAGNOSIS — I10 Essential (primary) hypertension: Secondary | ICD-10-CM

## 2023-06-16 DIAGNOSIS — Z7984 Long term (current) use of oral hypoglycemic drugs: Secondary | ICD-10-CM

## 2023-06-16 MED ORDER — ONETOUCH VERIO W/DEVICE KIT: PACK | 0 refills | Status: AC

## 2023-06-17 NOTE — Progress Notes (Signed)
Endocrinology Follow Up Note       06/17/2023, 6:48 AM   Subjective:    Patient ID: Kathryn Olson, female    DOB: 1961/04/21.  Kathryn Olson is being seen in follow up after being seen in consultation for management of currently uncontrolled symptomatic diabetes requested by  Lorelei Pont, DO.   Past Medical History:  Diagnosis Date   Anxiety    Asthma    CHF (congestive heart failure) (HCC)    Chronic abdominal pain    Chronic headaches    Complication of anesthesia    Crohn's disease (HCC) 03/2012   ? as of 06/05/2020: do not see endoscopic evidence or CT evidence of this. ?erroneous?    Diabetes mellitus without complication (HCC)    Environmental allergies    Essential hypertension 03/11/2021   Family history of anesthesia complication    MH during C Section   H/O colonoscopy    History of electroencephalogram 03/2014   normal   IBS (irritable bowel syndrome) June 2014   Malignant hyperthemia    [name] /   Pt has been tested positive   Migraine    Obsessive-compulsive disorder    PTSD (post-traumatic stress disorder)    Seizures (HCC)     Past Surgical History:  Procedure Laterality Date   BLADDER SURGERY     age 32   CESAREAN SECTION     X2   COLONOSCOPY  05/22/2012   SLF: Polyps, multiple hyperplastic in the sigmoid colon/Polyp in the rectum/  MODERATE Diverticulosis throughout the colon/ Internal hemorrhoids   ESOPHAGOGASTRODUODENOSCOPY     remote, had ulcers, [place], Dr. Donnie Coffin   ESOPHAGOGASTRODUODENOSCOPY  05/22/2012   SLF: Stricture in the distal esophagus/Moderate gastritis   PARTIAL HYSTERECTOMY     TUBAL LIGATION      Social History   Socioeconomic History   Marital status: Married    Spouse name: Not on file   Number of children: 2   Years of education: Not on file   Highest education level: Not on file  Occupational History   Occupation: plant, manual  labor, lifting   Occupation: Disabled   Tobacco Use   Smoking status: Former    Current packs/day: 0.00    Types: Cigarettes    Quit date: 01/02/2011    Years since quitting: 12.4   Smokeless tobacco: Never   Tobacco comments:    Quit a few years ago  Vaping Use   Vaping status: Never Used  Substance and Sexual Activity   Alcohol use: No   Drug use: No   Sexual activity: Yes    Birth control/protection: Surgical  Other Topics Concern   Not on file  Social History Narrative   Patient is married with 2 children.   Patient is disabled.   Patient has 12+ years or schooling    Patient states that she can write with both hands.          Social Determinants of Health   Financial Resource Strain: Not on file  Food Insecurity: Not on file  Transportation Needs: Not on file  Physical Activity: Not on file  Stress: Not on file  Social Connections: Not  on file    Family History  Problem Relation Age of Onset   Colon cancer Maternal Grandfather        before the age of 88   Heart attack Father    Alcohol abuse Father    Diabetes Mother    Hypertension Mother    Dementia Mother    Depression Mother    Liver disease Brother        does not know details, but related to MVA?   Anxiety disorder Sister    Bipolar disorder Sister    OCD Sister    Anxiety disorder Sister    Depression Sister    Malignant hyperthermia Daughter    Heart murmur Son    Inflammatory bowel disease Neg Hx    ADD / ADHD Neg Hx    Drug abuse Neg Hx    Paranoid behavior Neg Hx    Schizophrenia Neg Hx    Seizures Neg Hx    Sexual abuse Neg Hx    Physical abuse Neg Hx    Colon polyps Neg Hx     Outpatient Encounter Medications as of 06/16/2023  Medication Sig   albuterol (PROVENTIL) (5 MG/ML) 0.5% nebulizer solution Take 2.5 mg by nebulization every 6 (six) hours as needed for wheezing or shortness of breath.   albuterol (VENTOLIN HFA) 108 (90 Base) MCG/ACT inhaler Inhale 2 puffs into the lungs  every 6 (six) hours as needed for wheezing or shortness of breath.   Blood Glucose Monitoring Suppl (ONETOUCH VERIO) w/Device KIT Use to check glucose twice daily   carvedilol (COREG) 6.25 MG tablet Take 1 tablet (6.25 mg total) by mouth 2 (two) times daily.   hydrocortisone 2.5 % ointment APPLY 1 APPLICATION 4 TIMES A DAY   hydrOXYzine (VISTARIL) 25 MG capsule Take 25 mg by mouth daily as needed for itching.   insulin glargine (LANTUS SOLOSTAR) 100 UNIT/ML Solostar Pen Inject 10 units every day by subcutaneous route at bedtime for 30 days.   Insulin Pen Needle (PEN NEEDLES) 31G X 6 MM MISC Use to inject insulin once daily   levocetirizine (XYZAL) 5 MG tablet Take 5 mg by mouth every evening.   linaclotide (LINZESS) 290 MCG CAPS capsule Take 290 mcg by mouth daily.   metFORMIN (GLUCOPHAGE-XR) 500 MG 24 hr tablet Take 1 tablet (500 mg total) by mouth daily with breakfast.   omeprazole (PRILOSEC) 20 MG capsule Take 20 mg by mouth at bedtime. PRN   QUEtiapine (SEROQUEL) 200 MG tablet TAKE 1 TABLET AT BEDTIME   EPINEPHrine 0.3 mg/0.3 mL IJ SOAJ injection Inject 0.3 mg into the muscle as needed. (Patient not taking: Reported on 04/07/2023)   guaifenesin (ROBITUSSIN) 100 MG/5ML syrup Take 30 mLs by mouth 3 (three) times daily as needed for cough. (Patient not taking: Reported on 05/10/2023)   insulin degludec (TRESIBA FLEXTOUCH) 100 UNIT/ML FlexTouch Pen Inject 30 Units into the skin at bedtime. (Patient not taking: Reported on 06/07/2023)   No facility-administered encounter medications on file as of 06/16/2023.    ALLERGIES: Allergies  Allergen Reactions   Aspirin Anaphylaxis and Swelling   Lithium Bromide [Lithium] Anaphylaxis   Peanut Oil Anaphylaxis   Peanut-Containing Drug Products Anaphylaxis        Penicillins Anaphylaxis    Stops her heart. Has patient had a PCN reaction causing immediate rash, facial/tongue/throat swelling, SOB or lightheadedness with hypotension: yes Has patient had a  PCN reaction causing severe rash involving mucus membranes or skin necrosis: no Has patient  had a PCN reaction that required hospitalization: yes Has patient had a PCN reaction occurring within the last 10 years: yes If all of the above answers are "NO", then may proceed with Cephalosporin use.  Other reaction(s): Other (See Comments)   Sulfa Antibiotics Anaphylaxis   Sulfamethoxazole Anaphylaxis and Itching   Azithromycin Other (See Comments)   Other     Other reaction(s): anaphylaxis Other reaction(s): Other (See Comments)   Adhesive [Tape] Rash   Codeine Itching and Rash   Latex Rash   Rubbing Alcohol [Alcohol] Rash    VACCINATION STATUS: Immunization History  Administered Date(s) Administered   Influenza Inj Mdck Quad Pf 09/13/2016   Pneumococcal Polysaccharide-23 04/12/2017    Diabetes She presents for her follow-up diabetic visit. She has type 2 diabetes mellitus. Onset time: diagnosed at age 30 (relatively new in August 2023) Her disease course has been improving. There are no hypoglycemic associated symptoms. Associated symptoms include fatigue, polydipsia and polyuria. There are no hypoglycemic complications. Symptoms are stable. There are no diabetic complications. Risk factors for coronary artery disease include diabetes mellitus, family history, obesity, hypertension and sedentary lifestyle. Current diabetic treatment includes oral agent (monotherapy) and insulin injections. Her weight is stable. She is following a generally healthy diet. When asked about meal planning, she reported none. She has not had a previous visit with a dietitian. She participates in exercise intermittently. (She presents today with no recent readings (was using Libre CGM but did not care for it- switching to [facility]- awaiting shipment).  She does not have standard meter at home for checking glucose either.  Her prior CGM data (up to 8/4) shows GMI of 8.2%, improving.  She had been getting financial  assistance from son to afford her insulin.  She denies any s/s of hypoglycemia.) An ACE inhibitor/angiotensin II receptor blocker is not being taken. She does not see a podiatrist.Eye exam is current.     Review of systems  Constitutional: + Minimally fluctuating body weight, current Body mass index is 42.58 kg/m., + fatigue, no subjective hyperthermia, no subjective hypothermia Eyes: no blurry vision, no xerophthalmia ENT: no sore throat, no nodules palpated in throat, no dysphagia/odynophagia, no hoarseness Cardiovascular: no chest pain, no shortness of breath, no palpitations, no leg swelling Respiratory: no cough, no shortness of breath Gastrointestinal: no nausea/vomiting/diarrhea Musculoskeletal: no muscle/joint aches Skin: no rashes, no hyperemia Neurological: no tremors, no numbness, no tingling, no dizziness Psychiatric: + depression/anxiety- increased stress levels lately  Objective:     BP (!) 140/80 (BP Location: Right Arm, Patient Position: Sitting, Cuff Size: Large) Comment: Retake Manuel Cuff  Pulse 74   Ht 5\' 2"  (1.575 m)   Wt 232 lb 12.8 oz (105.6 kg)   BMI 42.58 kg/m   Wt Readings from Last 3 Encounters:  06/16/23 232 lb 12.8 oz (105.6 kg)  05/10/23 229 lb 12.8 oz (104.2 kg)  04/05/23 220 lb 3.2 oz (99.9 kg)     BP Readings from Last 3 Encounters:  06/16/23 (!) 140/80  05/10/23 138/74  04/05/23 (!) 140/81     Physical Exam- Limited  Constitutional:  Body mass index is 42.58 kg/m. , not in acute distress, normal state of mind Eyes:  EOMI, no exophthalmos Musculoskeletal: no gross deformities, strength intact in all four extremities, no gross restriction of joint movements Skin:  no rashes, no hyperemia Neurological: no tremor with outstretched hands   Diabetic Foot Exam - Simple   Simple Foot Form Diabetic Foot exam was performed with the following  findings: Yes 06/16/2023  2:27 PM  Visual Inspection No deformities, no ulcerations, no other skin  breakdown bilaterally: Yes Sensation Testing Intact to touch and monofilament testing bilaterally: Yes Pulse Check Posterior Tibialis and Dorsalis pulse intact bilaterally: Yes Comments     CMP ( most recent) CMP     Component Value Date/Time   NA 139 01/19/2021 2234   NA 136 12/31/2019 1130   K 4.1 01/19/2021 2234   K 4.2 05/29/2012 0916   CL 102 01/19/2021 2234   CL 103 05/29/2012 0916   CO2 25 01/19/2021 2234   CO2 27 05/29/2012 0916   GLUCOSE 326 (H) 01/19/2021 2234   BUN 14 01/20/2023 0000   CREATININE 0.7 01/20/2023 0000   CREATININE 0.77 01/19/2021 2234   CREATININE 0.70 05/29/2012 0916   CALCIUM 9.1 01/19/2021 2234   CALCIUM 9.8 05/29/2012 0916   PROT 7.4 01/19/2021 2234   PROT 7.0 09/04/2013 1728   PROT 7.2 05/29/2012 0916   ALBUMIN 3.7 01/19/2021 2234   ALBUMIN 4.3 09/04/2013 1728   AST 15 01/19/2021 2234   AST 17 05/29/2012 0916   ALT 12 01/19/2021 2234   ALKPHOS 141 (A) 01/20/2023 0000   ALKPHOS 74 05/29/2012 0916   BILITOT 0.5 01/19/2021 2234   BILITOT 0.3 05/29/2012 0916   EGFR 107 01/20/2023 0000   GFRNONAA >60 01/19/2021 2234     Diabetic Labs (most recent): Lab Results  Component Value Date   HGBA1C 15.3 01/20/2023   HGBA1C 6.2 (H) 08/22/2012     Lipid Panel ( most recent) Lipid Panel     Component Value Date/Time   CHOL 203 (H) 09/04/2013 1728   TRIG 185 (H) 09/04/2013 1728   HDL 59 09/04/2013 1728   LDLCALC 107 (H) 09/04/2013 1728      Lab Results  Component Value Date   TSH 0.944 09/04/2013   TSH 1.253 08/22/2012   TSH 1.149 03/29/2011   FREET4 1.08 03/29/2011           Assessment & Plan:   1) Type 2 diabetes mellitus with hyperglycemia, with long-term current use of insulin (HCC)  She presents today with no recent readings (was using Libre CGM but did not care for it- switching to [facility]- awaiting shipment).  She does not have standard meter at home for checking glucose either.  Her prior CGM data (up to 8/4) shows  GMI of 8.2%, improving.  She had been getting financial assistance from son to afford her insulin.  She denies any s/s of hypoglycemia.  She had A1c done on 7/9 which was 11.2%, improving from previous visit of 15.3%.  - Kathryn Olson has currently uncontrolled symptomatic type 2 DM since 62 years of age.   -Recent labs reviewed.  - I had a long discussion with her about the progressive nature of diabetes and the pathology behind its complications. -her diabetes is not currently complicated but she remains at a high risk for more acute and chronic complications which include CAD, CVA, CKD, retinopathy, and neuropathy. These are all discussed in detail with her.  The following Lifestyle Medicine recommendations according to American College of Lifestyle Medicine [facility]) were discussed and offered to patient and she agrees to start the journey:  A. Whole Foods, Plant-based plate comprising of fruits and vegetables, plant-based proteins, whole-grain carbohydrates was discussed in detail with the patient.   A list for source of those nutrients were also provided to the patient.  Patient will use only water or unsweetened tea  for hydration. B.  The need to stay away from risky substances including alcohol, smoking; obtaining 7 to 9 hours of restorative sleep, at least 150 minutes of moderate intensity exercise weekly, the importance of healthy social connections,  and stress reduction techniques were discussed. C.  A full color page of  Calorie density of various food groups per pound showing examples of each food groups was provided to the patient.  - Nutritional counseling repeated at each appointment due to patients tendency to fall back in to old habits.  - The patient admits there is a room for improvement in their diet and drink choices. -  Suggestion is made for the patient to avoid simple carbohydrates from their diet including Cakes, Sweet Desserts / Pastries, Ice Cream, Soda (diet and regular),  Sweet Tea, Candies, Chips, Cookies, Sweet Pastries, Store Bought Juices, Alcohol in Excess of 1-2 drinks a day, Artificial Sweeteners, Coffee Creamer, and "Sugar-free" Products. This will help patient to have stable blood glucose profile and potentially avoid unintended weight gain.   - I encouraged the patient to switch to unprocessed or minimally processed complex starch and increased protein intake (animal or plant source), fruits, and vegetables.   - Patient is advised to stick to a routine mealtimes to eat 3 meals a day and avoid unnecessary snacks (to snack only to correct hypoglycemia).  - I have approached her with the following individualized plan to manage her diabetes and patient agrees:   -Due to lack of logs to review, for safety purposes no changes will be made to her medications today.  She is advised to continue Lantus 30 units SQ nightly and Metformin 500 mg ER daily.  -she is encouraged to start monitoring glucose 2 times daily, before breakfast and before bed, and to call the clinic if she has readings less than 70 or above 300 for 3 tests in a row.  She did not care for the [facility] system (sensors would not stay) so she is getting Dexcom shipment to try.  - she is warned not to take insulin without proper monitoring per orders. - Adjustment parameters are given to her for hypo and hyperglycemia in writing.  - her NPH was previously discontinued, there are other superior products than intermediate acting insulin.  - she will be considered for incretin therapy as appropriate next visit, however cost is a concern for her.  She did take Ozempic in the past and did well but could no longer afford it.  - Specific targets for  A1c; LDL, HDL, and Triglycerides were discussed with the patient.  2) Blood Pressure /Hypertension:  her blood pressure is slightly above target today.   she is advised to continue her current medications including Coreg 6.25 mg po twice daily.  Will consider  starting low dose ace/arb at next visit if still elevated.  3) Lipids/Hyperlipidemia:    There is no recent lipid panel to review, nor is she on any lipid lowering medications. Will recheck lipid panel on subsequent visits.  4)  Weight/Diet:  her Body mass index is 42.58 kg/m.  -  clearly complicating her diabetes care.   she is a candidate for weight loss. I discussed with her the fact that loss of 5 - 10% of her  current body weight will have the most impact on her diabetes management.  Exercise, and detailed carbohydrates information provided  -  detailed on discharge instructions.  5) Chronic Care/Health Maintenance: -she is not on ACEI/ARB or Statin medications and  is encouraged to initiate and continue to follow up with Ophthalmology, Dentist, Podiatrist at least yearly or according to recommendations, and advised to stay away from smoking. I have recommended yearly flu vaccine and pneumonia vaccine at least every 5 years; moderate intensity exercise for up to 150 minutes weekly; and sleep for at least 7 hours a day.  - she is advised to maintain close follow up with Lorelei Pont, DO for primary care needs, as well as her other providers for optimal and coordinated care.     I spent  38  minutes in the care of the patient today including review of labs from CMP, Lipids, Thyroid Function, Hematology (current and previous including abstractions from other facilities); face-to-face time discussing  her blood glucose readings/logs, discussing hypoglycemia and hyperglycemia episodes and symptoms, medications doses, her options of short and long term treatment based on the latest standards of care / guidelines;  discussion about incorporating lifestyle medicine;  and documenting the encounter. Risk reduction counseling performed per USPSTF guidelines to reduce obesity and cardiovascular risk factors.     Please refer to Patient Instructions for Blood Glucose Monitoring and Insulin/Medications  Dosing Guide"  in media tab for additional information. Please  also refer to " Patient Self Inventory" in the Media  tab for reviewed elements of pertinent patient history.  Star Age participated in the discussions, expressed understanding, and voiced agreement with the above plans.  All questions were answered to her satisfaction. she is encouraged to contact clinic should she have any questions or concerns prior to her return visit.     Follow up plan: - Return in about 3 months (around 09/16/2023) for Diabetes F/U with A1c in office, No previsit labs, Bring meter and logs.   Ronny Bacon, [facility] [address] [place], [place] [address] Phone: [phone] Fax: [phone]  06/17/2023, 6:48 AM

## 2023-06-22 ENCOUNTER — Telehealth: Payer: Self-pay | Admitting: *Deleted

## 2023-06-22 NOTE — Telephone Encounter (Signed)
Patient called - voicemail left asking that we call her back - this is about the Dexcom - Freestyle  Patient was called and her voicemail box is not yet set up. We will attempt to call again.

## 2023-06-28 ENCOUNTER — Other Ambulatory Visit: Payer: Self-pay

## 2023-06-28 MED ORDER — ONETOUCH DELICA LANCETS 30G MISC: 2 refills | Status: AC

## 2023-06-28 MED ORDER — GLUCOSE BLOOD VI STRP: 1.0000 | ORAL_STRIP | Freq: Three times a day (TID) | 2 refills | Status: AC

## 2023-07-08 ENCOUNTER — Other Ambulatory Visit: Payer: Self-pay | Admitting: Nurse Practitioner

## 2023-07-08 ENCOUNTER — Other Ambulatory Visit: Payer: Self-pay | Admitting: Cardiology

## 2023-08-17 ENCOUNTER — Telehealth: Payer: Self-pay | Admitting: *Deleted

## 2023-08-17 NOTE — Telephone Encounter (Signed)
Patient left a voicemail that [employer] want send her anymore devices, until something has been worked out. In the message she ask if a Dexcom be sent somewhere else. I called the patient back at, [phone], she has not set up a voicemail . Will attempt to call the patient back, to find out more information.  Patient called back, she shares that [employer] shares she owes them money, she has called her insurance they have paid out what they owe. [employer] says that she owes the money for a delivery that they cannot give her insurance the date in October they claim it was sent out.So Community education officer and [employer] are working on this. Patient was told that it is not sure when she will get sensors. Shared with the patient that she could come by and we would give her a sample and to keep [place] informed.

## 2023-09-06 ENCOUNTER — Encounter: Payer: Self-pay | Admitting: Physician Assistant

## 2023-09-06 ENCOUNTER — Telehealth: Payer: Medicare HMO | Admitting: Physician Assistant

## 2023-09-06 DIAGNOSIS — F411 Generalized anxiety disorder: Secondary | ICD-10-CM | POA: Diagnosis not present

## 2023-09-06 DIAGNOSIS — F319 Bipolar disorder, unspecified: Secondary | ICD-10-CM | POA: Diagnosis not present

## 2023-09-06 MED ORDER — QUETIAPINE FUMARATE 200 MG PO TABS: 200.0000 mg | ORAL_TABLET | Freq: Every day | ORAL | 1 refills | Status: DC

## 2023-09-06 NOTE — Progress Notes (Addendum)
[facility]  Patient ID: Kathryn Olson,  MRN: [id number]  PCP: Lorelei Pont, DO  Date of Evaluation: 09/06/2023 Time spent:20 minutes  Chief Complaint:  Chief Complaint   Follow-up    Virtual Visit via Telehealth  I connected with patient by a video enabled telemedicine application with their informed consent, and verified patient privacy and that I am speaking with the correct person using two identifiers.  I am private, in my office and the patient is at home.  I discussed the limitations, risks, security and privacy concerns of performing an evaluation and management service by video and the availability of in person appointments. I also discussed with the patient that there may be a patient responsible charge related to this service. The patient expressed understanding and agreed to proceed.   I discussed the assessment and treatment plan with the patient. The patient was provided an opportunity to ask questions and all were answered. The patient agreed with the plan and demonstrated an understanding of the instructions.   The patient was advised to call back or seek an in-person evaluation if the symptoms worsen or if the condition fails to improve as anticipated.  I provided 20 minutes of non-face-to-face time during this encounter.  HISTORY/CURRENT STATUS: For routine med check.  States she's doing well. Not needing the Hydroxyzine now. Seroquel is helping her mood a lot.  She is able to enjoy things.  Energy and motivation are good.   No extreme sadness, tearfulness, or feelings of hopelessness.  Sleeps well most of the time. ADLs and personal hygiene are normal.   Denies any changes in concentration, making decisions, or remembering things.  Appetite has not changed.  Weight is stable.  Under some stress with one of her grandsons, 4 yo, disrespecting her. No PA.  Denies suicidal or homicidal thoughts.  Patient denies increased energy with decreased need for  sleep, increased talkativeness, racing thoughts, impulsivity or risky behaviors, increased spending, increased libido, grandiosity, increased irritability or anger, paranoia, or hallucinations.  Review of Systems  Constitutional: Negative.   HENT: Negative.    Eyes: Negative.   Respiratory: Negative.    Cardiovascular: Negative.   Gastrointestinal: Negative.   Genitourinary: Negative.   Musculoskeletal: Negative.   Skin: Negative.   Neurological: Negative.   Endo/Heme/Allergies: Negative.   Psychiatric/Behavioral:  Positive for depression.        See HPI    Individual Medical History/ Review of Systems: Changes? :No     Past medications for mental health diagnoses include: Equetro, Seroquel, VPA, Xanax, Buspar, Zoloft, Elavil  Allergies: Aspirin, Lithium bromide [lithium], Peanut oil, Peanut-containing drug products, Penicillins, Sulfa antibiotics, Sulfamethoxazole, Azithromycin, Other, Adhesive [tape], Codeine, Latex, and Rubbing alcohol [alcohol]  Current Medications:  Current Outpatient Medications:    albuterol (PROVENTIL) (5 MG/ML) 0.5% nebulizer solution, Take 2.5 mg by nebulization every 6 (six) hours as needed for wheezing or shortness of breath., Disp: , Rfl:    albuterol (VENTOLIN HFA) 108 (90 Base) MCG/ACT inhaler, Inhale 2 puffs into the lungs every 6 (six) hours as needed for wheezing or shortness of breath., Disp: 18 g, Rfl: 2   carvedilol (COREG) 6.25 MG tablet, TAKE 1 TABLET BY MOUTH TWICE A DAY, Disp: 180 tablet, Rfl: 3   hydrocortisone 2.5 % ointment, APPLY 1 APPLICATION 4 TIMES A DAY, Disp: 40 g, Rfl: 1   hydrOXYzine (VISTARIL) 25 MG capsule, Take 25 mg by mouth daily as needed for itching., Disp: , Rfl:    insulin glargine (LANTUS  SOLOSTAR) 100 UNIT/ML Solostar Pen, Inject 10 units every day by subcutaneous route at bedtime for 30 days., Disp: , Rfl:    Insulin Pen Needle (PEN NEEDLES) 31G X 6 MM MISC, Use to inject insulin once daily, Disp: 100 each, Rfl: 3    levocetirizine (XYZAL) 5 MG tablet, Take 5 mg by mouth every evening., Disp: , Rfl:    linaclotide (LINZESS) 290 MCG CAPS capsule, Take 290 mcg by mouth daily., Disp: , Rfl:    metFORMIN (GLUCOPHAGE-XR) 500 MG 24 hr tablet, TAKE 1 TABLET BY MOUTH EVERY DAY WITH BREAKFAST, Disp: 90 tablet, Rfl: 0   omeprazole (PRILOSEC) 20 MG capsule, Take 20 mg by mouth at bedtime. PRN, Disp: , Rfl:    Blood Glucose Monitoring Suppl (ONETOUCH VERIO) w/Device KIT, Use to check glucose twice daily (Patient not taking: Reported on 09/06/2023), Disp: 1 kit, Rfl: 0   glucose blood test strip, 1 each by Other route 3 (three) times daily. Use as instructed tid. E11.65 (Patient not taking: Reported on 09/06/2023), Disp: 100 each, Rfl: 2   OneTouch Delica Lancets 30G MISC, Use as instructed tid. E11.65 One touch Verio (Patient not taking: Reported on 09/06/2023), Disp: 100 each, Rfl: 2   QUEtiapine (SEROQUEL) 200 MG tablet, Take 1 tablet (200 mg total) by mouth at bedtime., Disp: 90 tablet, Rfl: 1 Medication Side Effects: none  Family Medical/ Social History: Changes? No  MENTAL HEALTH EXAM:  There were no vitals taken for this visit.There is no height or weight on file to calculate BMI.  General Appearance: Casual, Well Groomed, and Obese  Eye Contact:  Good  Speech:  Clear and Coherent and Normal Rate  Volume:  Normal  Mood:  Euthymic  Affect:  Congruent  Thought Process:  Goal Directed and Descriptions of Associations: Circumstantial  Orientation:  Full (Time, Place, and Person)  Thought Content: Logical   Suicidal Thoughts:  No  Homicidal Thoughts:  No  Memory:  WNL  Judgement:  Good  Insight:  Good  Psychomotor Activity:  Normal  Concentration:  Concentration: Good and Attention Span: Good  Recall:  Good  Fund of Knowledge: Good  Language: Good  Assets:  Desire for Improvement Financial Resources/Insurance Housing Resilience Transportation  ADL's:  Intact  Cognition: WNL  Prognosis:  Good   PCP  and Endocrinologist follow her labs  DIAGNOSES:    ICD-10-CM   1. Bipolar I disorder (HCC)  F31.9     2. Generalized anxiety disorder  F41.1       Receiving Psychotherapy: No   RECOMMENDATIONS:  PDMP reviewed.  No controlled substances. I provided 20 minutes of non-face-to-face time during this encounter, including time spent before and after the visit in records review, medical decision making, counseling pertinent to today's visit, and charting.   She is doing well so no changes need to be made.  Cont Seroquel 200 mg, 1 p.o. nightly. Return in 6 months.  Melony Overly, PA-C

## 2023-10-07 ENCOUNTER — Ambulatory Visit: Payer: Medicare HMO | Admitting: Nurse Practitioner

## 2023-11-02 ENCOUNTER — Encounter (INDEPENDENT_AMBULATORY_CARE_PROVIDER_SITE_OTHER): Payer: Self-pay | Admitting: *Deleted

## 2023-12-18 ENCOUNTER — Other Ambulatory Visit: Payer: Self-pay | Admitting: Nurse Practitioner

## 2024-01-01 ENCOUNTER — Other Ambulatory Visit: Payer: Self-pay | Admitting: Nurse Practitioner

## 2024-01-02 ENCOUNTER — Ambulatory Visit: Payer: Medicare HMO | Admitting: Cardiology

## 2024-01-05 ENCOUNTER — Ambulatory Visit: Payer: Medicare HMO | Admitting: Nurse Practitioner

## 2024-01-05 DIAGNOSIS — Z7984 Long term (current) use of oral hypoglycemic drugs: Secondary | ICD-10-CM

## 2024-01-05 DIAGNOSIS — I10 Essential (primary) hypertension: Secondary | ICD-10-CM

## 2024-01-05 DIAGNOSIS — Z794 Long term (current) use of insulin: Secondary | ICD-10-CM

## 2024-01-05 DIAGNOSIS — E782 Mixed hyperlipidemia: Secondary | ICD-10-CM

## 2024-01-16 ENCOUNTER — Ambulatory Visit: Payer: Medicare HMO | Admitting: Cardiology

## 2024-01-16 ENCOUNTER — Encounter: Payer: Self-pay | Admitting: Cardiology

## 2024-01-16 ENCOUNTER — Ambulatory Visit: Attending: Cardiology | Admitting: Cardiology

## 2024-01-16 VITALS — BP 124/88 | HR 78 | Ht 62.0 in | Wt 212.0 lb

## 2024-01-16 DIAGNOSIS — R002 Palpitations: Secondary | ICD-10-CM | POA: Diagnosis not present

## 2024-01-16 DIAGNOSIS — I10 Essential (primary) hypertension: Secondary | ICD-10-CM | POA: Diagnosis not present

## 2024-01-16 NOTE — Progress Notes (Signed)
  Electrophysiology Office Note:   Date:  01/16/2024  ID:  Kathryn Olson, DOB 1961-05-23, MRN [id number]  Primary Cardiologist: None Primary Heart Failure: None Electrophysiologist: Zamarian Scarano Jorja Loa, MD      History of Present Illness:   Kathryn Olson is a 63 y.o. female with h/o chronic systolic heart failure with normalized ejection fraction, Crohn's disease, diabetes, hypertension, palpitations seen today for routine electrophysiology followup.   Since last being seen in our clinic the patient reports doing well from a cardiac perspective.  She has had no chest pain or shortness of breath.  She is unaware of palpitations recently.  She does have some fluttering episodes, but these only occur for a few seconds.  She feels that her heart rhythm has been under good control.  She is having some nausea.  She feels that it is from motion sickness from her drive on the way in and being in the parking deck.  she denies chest pain, palpitations, dyspnea, PND, orthopnea, nausea, vomiting, dizziness, syncope, edema, weight gain, or early satiety.   Review of systems complete and found to be negative unless listed in HPI.   EP Information / Studies Reviewed:    EKG is ordered today. Personal review as below.  EKG Interpretation Date/Time:  Monday January 16 2024 11:47:10 EDT Ventricular Rate:  78 PR Interval:  134 QRS Duration:  76 QT Interval:  400 QTC Calculation: 456 R Axis:   41  Text Interpretation: Normal sinus rhythm Normal ECG When compared with ECG of 19-Jan-2021 20:21, No significant change was found Confirmed by Conrado Nance ([id number]) on 01/16/2024 11:52:58 AM     Risk Assessment/Calculations:             Physical Exam:   VS:  BP 124/88 (BP Location: Left Arm, Patient Position: Sitting, Cuff Size: Large)   Pulse 78   Ht 5\' 2"  (1.575 m)   Wt 212 lb (96.2 kg)   SpO2 96%   BMI 38.78 kg/m    Wt Readings from Last 3 Encounters:  01/16/24 212 lb (96.2 kg)  06/16/23 232 lb  12.8 oz (105.6 kg)  05/10/23 229 lb 12.8 oz (104.2 kg)     GEN: Well nourished, well developed in no acute distress NECK: No JVD; No carotid bruits CARDIAC: Regular rate and rhythm, no murmurs, rubs, gallops RESPIRATORY:  Clear to auscultation without rales, wheezing or rhonchi  ABDOMEN: Soft, non-tender, non-distended EXTREMITIES:  No edema; No deformity   ASSESSMENT AND PLAN:    1.  Palpitations: Potation's are well-controlled.  She is currently on carvedilol 6.25 mg twice daily.  They are well-controlled and we have not been making any adjustments to her medications, we Nature Kueker allow her to follow-up with her primary physician.  If she does have any worrisome palpitations in the future that are not well-controlled with titration of her carvedilol, I would be happy to see her back.   2.  Hypertension: Currently well-controlled  3.  History of CHF: Ejection fraction has normalized.  No changes.  Follow up with Dr. Elberta Fortis  as needed   Signed, Dennise Bamber Jorja Loa, MD

## 2024-02-20 ENCOUNTER — Other Ambulatory Visit: Payer: Self-pay | Admitting: Family Medicine

## 2024-02-20 DIAGNOSIS — Z1231 Encounter for screening mammogram for malignant neoplasm of breast: Secondary | ICD-10-CM

## 2024-03-05 ENCOUNTER — Encounter: Payer: Self-pay | Admitting: Physician Assistant

## 2024-03-05 ENCOUNTER — Ambulatory Visit: Payer: Medicare HMO | Admitting: Physician Assistant

## 2024-03-05 DIAGNOSIS — F319 Bipolar disorder, unspecified: Secondary | ICD-10-CM

## 2024-03-05 DIAGNOSIS — F411 Generalized anxiety disorder: Secondary | ICD-10-CM | POA: Diagnosis not present

## 2024-03-05 MED ORDER — QUETIAPINE FUMARATE 200 MG PO TABS: 200.0000 mg | ORAL_TABLET | Freq: Every day | ORAL | 1 refills | Status: DC

## 2024-03-05 NOTE — Progress Notes (Signed)
 [facility]  Patient ID: Kathryn Olson,  MRN: [id number]  PCP: Dorrine Gaudy, DO  Date of Evaluation: 03/05/2024 Time spent:20 minutes  Chief Complaint:  Chief Complaint   Follow-up    HISTORY/CURRENT STATUS: For routine med check.  Is doing well with the Seroquel . Patient is able to enjoy things.  Energy and motivation are good.  No extreme sadness, tearfulness, or feelings of hopelessness.  Sleeps well most of the time, she likes to sleep during the day and stay awake at night.  Always worked 3rd shift. ADLs and personal hygiene are normal.   Denies any changes in concentration, making decisions, or remembering things.  Appetite has not changed.  Weight is stable. No c/o anxiety. Denies suicidal or homicidal thoughts.  Patient denies increased energy with decreased need for sleep, increased talkativeness, racing thoughts, impulsivity or risky behaviors, increased spending, increased libido, grandiosity, increased irritability or anger, paranoia, or hallucinations.  Denies dizziness, syncope, seizures, numbness, tingling, tremor, tics, unsteady gait, slurred speech, confusion. Denies muscle or joint pain, stiffness, or dystonia. Denies unexplained weight loss, frequent infections, or sores that heal slowly.  No polyphagia, polydipsia, or polyuria. Denies visual changes or paresthesias.   Individual Medical History/ Review of Systems: Changes? :No     Past medications for mental health diagnoses include: Equetro , Seroquel , VPA, Xanax , Buspar , Zoloft , Elavil  Allergies: Aspirin, Lithium bromide [lithium], Peanut oil, Peanut-containing drug products, Penicillins, Sulfa antibiotics, Sulfamethoxazole, Azithromycin , Other, Adhesive [tape], Codeine, Latex, and Rubbing alcohol  [alcohol ]  Current Medications:  Current Outpatient Medications:    albuterol  (PROVENTIL ) (5 MG/ML) 0.5% nebulizer solution, Take 2.5 mg by nebulization every 6 (six) hours as needed for wheezing or  shortness of breath., Disp: , Rfl:    albuterol  (VENTOLIN  HFA) 108 (90 Base) MCG/ACT inhaler, Inhale 2 puffs into the lungs every 6 (six) hours as needed for wheezing or shortness of breath., Disp: 18 g, Rfl: 2   carvedilol  (COREG ) 6.25 MG tablet, TAKE 1 TABLET BY MOUTH TWICE A DAY, Disp: 180 tablet, Rfl: 3   hydrocortisone  2.5 % ointment, APPLY 1 APPLICATION 4 TIMES A DAY, Disp: 40 g, Rfl: 1   hydrOXYzine  (VISTARIL ) 25 MG capsule, Take 25 mg by mouth daily as needed for itching., Disp: , Rfl:    insulin glargine  (LANTUS  SOLOSTAR) 100 UNIT/ML Solostar Pen, Inject 10 units every day by subcutaneous route at bedtime for 30 days., Disp: , Rfl:    Insulin Pen Needle (PEN NEEDLES) 31G X 6 MM MISC, Use to inject insulin once daily, Disp: 100 each, Rfl: 3   levocetirizine (XYZAL) 5 MG tablet, Take 5 mg by mouth every evening., Disp: , Rfl:    linaclotide  (LINZESS ) 290 MCG CAPS capsule, Take 290 mcg by mouth daily., Disp: , Rfl:    metFORMIN  (GLUCOPHAGE -XR) 500 MG 24 hr tablet, TAKE 1 TABLET BY MOUTH EVERY DAY WITH BREAKFAST, Disp: 90 tablet, Rfl: 1   omeprazole (PRILOSEC) 20 MG capsule, Take 20 mg by mouth at bedtime. PRN, Disp: , Rfl:    Blood Glucose Monitoring Suppl (ONETOUCH VERIO) w/Device KIT, Use to check glucose twice daily (Patient not taking: Reported on 03/05/2024), Disp: 1 kit, Rfl: 0   glucose blood test strip, 1 each by Other route 3 (three) times daily. Use as instructed tid. E11.65 (Patient not taking: Reported on 03/05/2024), Disp: 100 each, Rfl: 2   OneTouch Delica Lancets 30G MISC, Use as instructed tid. E11.65 One touch Verio (Patient not taking: Reported on 03/05/2024), Disp: 100 each, Rfl: 2  QUEtiapine  (SEROQUEL ) 200 MG tablet, Take 1 tablet (200 mg total) by mouth at bedtime., Disp: 90 tablet, Rfl: 1 Medication Side Effects: none  Family Medical/ Social History: Changes? No  MENTAL HEALTH EXAM:  There were no vitals taken for this visit.There is no height or weight on file to  calculate BMI.  General Appearance: Casual, Well Groomed, and Obese  Eye Contact:  Good  Speech:  Clear and Coherent and Normal Rate  Volume:  Normal  Mood:  Euthymic  Affect:  Congruent  Thought Process:  Goal Directed and Descriptions of Associations: Circumstantial  Orientation:  Full (Time, Place, and Person)  Thought Content: Logical   Suicidal Thoughts:  No  Homicidal Thoughts:  No  Memory:  WNL  Judgement:  Good  Insight:  Good  Psychomotor Activity:  Normal  Concentration:  Concentration: Good and Attention Span: Good  Recall:  Good  Fund of Knowledge: Good  Language: Good  Assets:  Communication Skills Desire for Improvement Financial Resources/Insurance Housing Resilience Transportation  ADL's:  Intact  Cognition: WNL  Prognosis:  Good   PCP and Endocrinologist follow her labs  DIAGNOSES:    ICD-10-CM   1. Bipolar I disorder (HCC)  F31.9     2. Generalized anxiety disorder  F41.1      Receiving Psychotherapy: No   RECOMMENDATIONS:  PDMP reviewed.  No controlled substances. I provided 20 minutes of face to face time during this encounter, including time spent before and after the visit in records review, medical decision making, counseling pertinent to today's visit, and charting.   She's doing well so no changes are needed.  Cont Seroquel  200 mg, 1 p.o. nightly. Return in 6 months.  Marvia Slocumb, PA-C

## 2024-03-20 ENCOUNTER — Ambulatory Visit: Admitting: Nurse Practitioner

## 2024-03-21 ENCOUNTER — Ambulatory Visit
Admission: RE | Admit: 2024-03-21 | Discharge: 2024-03-21 | Disposition: A | Discharge status: Home / Self Care | Source: Ambulatory Visit | Attending: Family Medicine | Admitting: Family Medicine

## 2024-03-21 DIAGNOSIS — Z1231 Encounter for screening mammogram for malignant neoplasm of breast: Secondary | ICD-10-CM

## 2024-09-05 ENCOUNTER — Encounter: Payer: Self-pay | Admitting: Physician Assistant

## 2024-09-05 ENCOUNTER — Telehealth (INDEPENDENT_AMBULATORY_CARE_PROVIDER_SITE_OTHER): Admitting: Physician Assistant

## 2024-09-05 DIAGNOSIS — F411 Generalized anxiety disorder: Secondary | ICD-10-CM

## 2024-09-05 DIAGNOSIS — F319 Bipolar disorder, unspecified: Secondary | ICD-10-CM

## 2024-09-05 MED ORDER — QUETIAPINE FUMARATE 200 MG PO TABS: 200.0000 mg | ORAL_TABLET | Freq: Every day | ORAL | 1 refills | Status: AC

## 2024-09-05 NOTE — Progress Notes (Signed)
 [facility]  Patient ID: Kathryn Olson,  MRN: [id number]  PCP: Henriette Anes, DO  Date of Evaluation: 09/05/2024 Time spent:20 minutes  Chief Complaint:  Chief Complaint   Follow-up    Virtual Visit via Telehealth  I connected with patient by a video enabled telemedicine application with their informed consent, and verified patient privacy and that I am speaking with the correct person using two identifiers.  I am private, in my office and the patient is at home.  I discussed the limitations, risks, security and privacy concerns of performing an evaluation and management service by video and the availability of in person appointments. I also discussed with the patient that there may be a patient responsible charge related to this service. The patient expressed understanding and agreed to proceed.   I discussed the assessment and treatment plan with the patient. The patient was provided an opportunity to ask questions and all were answered. The patient agreed with the plan and demonstrated an understanding of the instructions.   The patient was advised to call back or seek an in-person evaluation if the symptoms worsen or if the condition fails to improve as anticipated.  I provided approximately 20 minutes of non-face-to-face time during this encounter.  HISTORY/CURRENT STATUS: For routine med check.  Doing well. She is able to enjoy things.  Likes to watch her  stories and game shows, read, plays word games on her phone.  Energy and motivation are good.   No extreme sadness, tearfulness, or feelings of hopelessness.  Sleeps well.  ADLs and personal hygiene are normal.   Denies any changes in concentration, making decisions, or remembering things.  Benjaman gets anxious but not often.  No SI/HI.  Patient denies increased energy with decreased need for sleep, increased talkativeness, racing thoughts, impulsivity or risky behaviors, increased spending, grandiosity, increased  irritability or anger, paranoia, or hallucinations.  Individual Medical History/ Review of Systems: Changes? :No     Past medications for mental health diagnoses include: Equetro , Seroquel , VPA, Xanax , Buspar , Zoloft , Elavil  Allergies: Aspirin, Lithium bromide [lithium], Peanut oil, Peanut-containing drug products, Penicillins, Sulfa antibiotics, Sulfamethoxazole, Azithromycin , Other, Adhesive [tape], Codeine, Latex, and Rubbing alcohol  [alcohol ]  Current Medications:  Current Outpatient Medications:    albuterol  (PROVENTIL ) (5 MG/ML) 0.5% nebulizer solution, Take 2.5 mg by nebulization every 6 (six) hours as needed for wheezing or shortness of breath., Disp: , Rfl:    albuterol  (VENTOLIN  HFA) 108 (90 Base) MCG/ACT inhaler, Inhale 2 puffs into the lungs every 6 (six) hours as needed for wheezing or shortness of breath., Disp: 18 g, Rfl: 2   carvedilol  (COREG ) 6.25 MG tablet, TAKE 1 TABLET BY MOUTH TWICE A DAY, Disp: 180 tablet, Rfl: 3   hydrocortisone  2.5 % ointment, APPLY 1 APPLICATION 4 TIMES A DAY, Disp: 40 g, Rfl: 1   hydrOXYzine  (VISTARIL ) 25 MG capsule, Take 25 mg by mouth daily as needed for itching., Disp: , Rfl:    levocetirizine (XYZAL) 5 MG tablet, Take 5 mg by mouth every evening., Disp: , Rfl:    linaclotide  (LINZESS ) 290 MCG CAPS capsule, Take 290 mcg by mouth daily., Disp: , Rfl:    metFORMIN  (GLUCOPHAGE -XR) 500 MG 24 hr tablet, TAKE 1 TABLET BY MOUTH EVERY DAY WITH BREAKFAST, Disp: 90 tablet, Rfl: 1   omeprazole (PRILOSEC) 20 MG capsule, Take 20 mg by mouth at bedtime. PRN, Disp: , Rfl:    Blood Glucose Monitoring Suppl (ONETOUCH VERIO) w/Device KIT, Use to check glucose twice daily (  Patient not taking: Reported on 03/05/2024), Disp: 1 kit, Rfl: 0   glucose blood test strip, 1 each by Other route 3 (three) times daily. Use as instructed tid. E11.65 (Patient not taking: Reported on 03/05/2024), Disp: 100 each, Rfl: 2   insulin glargine  (LANTUS  SOLOSTAR) 100 UNIT/ML Solostar Pen,  Inject 10 units every day by subcutaneous route at bedtime for 30 days., Disp: , Rfl:    Insulin Pen Needle (PEN NEEDLES) 31G X 6 MM MISC, Use to inject insulin once daily, Disp: 100 each, Rfl: 3   OneTouch Delica Lancets 30G MISC, Use as instructed tid. E11.65 One touch Verio (Patient not taking: Reported on 03/05/2024), Disp: 100 each, Rfl: 2   QUEtiapine  (SEROQUEL ) 200 MG tablet, Take 1 tablet (200 mg total) by mouth at bedtime., Disp: 90 tablet, Rfl: 1 Medication Side Effects: none  Family Medical/ Social History: Changes?  A niece died last month from pancreatic cancer.  Husband's cousin died, several other extended family members died the same week. It's been sad but she's doing ok.   MENTAL HEALTH EXAM:  There were no vitals taken for this visit.There is no height or weight on file to calculate BMI.  General Appearance: Casual, Well Groomed, and Obese  Eye Contact:  Good  Speech:  Clear and Coherent and Normal Rate  Volume:  Normal  Mood:  Euthymic  Affect:  Congruent  Thought Process:  Goal Directed and Descriptions of Associations: Circumstantial  Orientation:  Full (Time, Place, and Person)  Thought Content: Logical   Suicidal Thoughts:  No  Homicidal Thoughts:  No  Memory:  WNL  Judgement:  Good  Insight:  Good  Psychomotor Activity:  Normal  Concentration:  Concentration: Good and Attention Span: Good  Recall:  Good  Fund of Knowledge: Good  Language: Good  Assets:  Communication Skills Desire for Improvement Financial Resources/Insurance Housing Leisure Time Resilience Transportation  ADL's:  Intact  Cognition: WNL  Prognosis:  Good   PCP and Endocrinologist follow her labs  DIAGNOSES:    ICD-10-CM   1. Bipolar I disorder (HCC)  F31.9     2. Generalized anxiety disorder  F41.1       Receiving Psychotherapy: No   RECOMMENDATIONS:  PDMP reviewed.  No controlled substances. I provided approximately 20 minutes of non-face-to-face time during this  encounter, including time spent before and after the visit in records review, medical decision making, counseling pertinent to today's visit, and charting.    She's doing well with the Seroquel  so no changes need to be made.   Cont Seroquel  200 mg, 1 p.o. nightly. Return in 6 months.  Verneita Cooks, PA-C

## 2025-03-05 ENCOUNTER — Ambulatory Visit: Admitting: Physician Assistant
